# Patient Record
Sex: Female | Born: 1985
Health system: Southern US, Community
[De-identification: ages and names within clinical notes are randomized; demographics above are authoritative.]

## PROBLEM LIST (undated history)

## (undated) ENCOUNTER — Inpatient Hospital Stay (HOSPITAL_COMMUNITY): Payer: Self-pay

## (undated) DIAGNOSIS — R002 Palpitations: Secondary | ICD-10-CM

## (undated) DIAGNOSIS — R51 Headache: Secondary | ICD-10-CM

## (undated) DIAGNOSIS — B999 Unspecified infectious disease: Secondary | ICD-10-CM

## (undated) DIAGNOSIS — I493 Ventricular premature depolarization: Secondary | ICD-10-CM

## (undated) DIAGNOSIS — Z8614 Personal history of Methicillin resistant Staphylococcus aureus infection: Secondary | ICD-10-CM

## (undated) DIAGNOSIS — R519 Headache, unspecified: Secondary | ICD-10-CM

## (undated) DIAGNOSIS — R079 Chest pain, unspecified: Secondary | ICD-10-CM

## (undated) DIAGNOSIS — J45909 Unspecified asthma, uncomplicated: Secondary | ICD-10-CM

## (undated) DIAGNOSIS — I10 Essential (primary) hypertension: Secondary | ICD-10-CM

## (undated) DIAGNOSIS — O1490 Unspecified pre-eclampsia, unspecified trimester: Secondary | ICD-10-CM

## (undated) DIAGNOSIS — F419 Anxiety disorder, unspecified: Secondary | ICD-10-CM

## (undated) DIAGNOSIS — R0600 Dyspnea, unspecified: Secondary | ICD-10-CM

## (undated) HISTORY — DX: Chest pain, unspecified: R07.9

## (undated) HISTORY — DX: Dyspnea, unspecified: R06.00

## (undated) HISTORY — DX: Ventricular premature depolarization: I49.3

## (undated) HISTORY — DX: Anxiety disorder, unspecified: F41.9

## (undated) HISTORY — DX: Palpitations: R00.2

## (undated) HISTORY — DX: Essential (primary) hypertension: I10

## (undated) HISTORY — PX: NO PAST SURGERIES: SHX2092

---
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
## 2005-02-09 ENCOUNTER — Emergency Department (HOSPITAL_COMMUNITY): Admission: EM | Admit: 2005-02-09 | Discharge: 2005-02-09 | Payer: Self-pay | Admitting: Family Medicine

## 2006-03-04 ENCOUNTER — Inpatient Hospital Stay (HOSPITAL_COMMUNITY): Admission: AD | Admit: 2006-03-04 | Discharge: 2006-03-04 | Payer: Self-pay | Admitting: Family Medicine

## 2006-03-20 ENCOUNTER — Emergency Department (HOSPITAL_COMMUNITY): Admission: EM | Admit: 2006-03-20 | Discharge: 2006-03-20 | Payer: Self-pay | Admitting: Emergency Medicine

## 2006-11-23 ENCOUNTER — Emergency Department (HOSPITAL_COMMUNITY): Admission: EM | Admit: 2006-11-23 | Discharge: 2006-11-23 | Payer: Self-pay | Admitting: Emergency Medicine

## 2007-02-08 ENCOUNTER — Emergency Department (HOSPITAL_COMMUNITY): Admission: EM | Admit: 2007-02-08 | Discharge: 2007-02-08 | Payer: Self-pay | Admitting: Emergency Medicine

## 2007-04-25 ENCOUNTER — Emergency Department (HOSPITAL_COMMUNITY): Admission: EM | Admit: 2007-04-25 | Discharge: 2007-04-25 | Payer: Self-pay | Admitting: Emergency Medicine

## 2007-12-12 ENCOUNTER — Inpatient Hospital Stay (HOSPITAL_COMMUNITY): Admission: AD | Admit: 2007-12-12 | Discharge: 2007-12-13 | Payer: Self-pay | Admitting: Obstetrics & Gynecology

## 2008-03-26 ENCOUNTER — Inpatient Hospital Stay (HOSPITAL_COMMUNITY): Admission: AD | Admit: 2008-03-26 | Discharge: 2008-03-26 | Payer: Self-pay | Admitting: Obstetrics & Gynecology

## 2008-04-04 ENCOUNTER — Inpatient Hospital Stay (HOSPITAL_COMMUNITY): Admission: AD | Admit: 2008-04-04 | Discharge: 2008-04-04 | Payer: Self-pay | Admitting: Obstetrics & Gynecology

## 2009-05-23 ENCOUNTER — Emergency Department (HOSPITAL_COMMUNITY): Admission: EM | Admit: 2009-05-23 | Discharge: 2009-05-23 | Payer: Self-pay | Admitting: Family Medicine

## 2009-09-06 ENCOUNTER — Emergency Department (HOSPITAL_COMMUNITY): Admission: EM | Admit: 2009-09-06 | Discharge: 2009-09-06 | Payer: Self-pay | Admitting: Family Medicine

## 2010-04-23 ENCOUNTER — Emergency Department (HOSPITAL_COMMUNITY): Admission: EM | Admit: 2010-04-23 | Discharge: 2010-04-23 | Payer: Self-pay | Admitting: Family Medicine

## 2011-01-25 LAB — POCT RAPID STREP A (OFFICE): Streptococcus, Group A Screen (Direct): NEGATIVE

## 2011-03-27 NOTE — Discharge Summary (Signed)
NAME:  Raven Walters, Raven Walters                      ACCOUNT NO.:  [id number]   MEDICAL RECORD NO.:  [id number]                   PATIENT TYPE:  INP   LOCATION:  9157                                 FACILITY:  [facility]   PHYSICIAN:  Phil D. Okey Dupre, M.D.                  DATE OF BIRTH:  April 22, 1986   DATE OF ADMISSION:  01/06/2003  DATE OF DISCHARGE:  01/10/2003                                 DISCHARGE SUMMARY   DISCHARGE DIAGNOSES:  1. Preterm contractions.  2. Bacterial vaginosis.  3. Trichomonas.  4. Chlamydia.  5. External cervical dilatation.   DISCHARGE MEDICATIONS:  1. Prenatal vitamins.  2. Flagyl 500 mg p.o. b.i.d. x7 total doses.  3. Terbutaline 2.5 mg p.o. q.4-6h. p.r.n. contractions.   FOLLOWUP STUDIES:  GBS done January 07, 2003 is pending.   CONSULTATIONS:  None.   HISTORY OF PRESENT ILLNESS:  Please see admission history and physical for  further details.  Briefly, the patient is a very pleasant 25 year old G1, P0  female who is admitted at 27 weeks and 1 day with complaints of uterine  contractions.  She was admitted to the antenatal unit for further  monitoring.   Upon admission appropriate laboratory studies were obtained with the results  as noted in laboratory data section.  She was placed on monitor uterine  contractions as well as fetal heart rate was monitored closely.  The patient  had intermittent contractions throughout her stay which improved.  She  received one dose of terbutaline at the time of admission which slowed down  her contractions and she received one on the day prior to discharge which  further reduced her contractions.  Serial examinations were obtained and  there was no change in her cervical dilatation.  The patient was initially  empirically treated with Mefoxin and Flagyl.  This was continued throughout  her stay.  Her Chlamydia was positive and she was therefore treated with  azithromycin.  Gonorrhea was treated with Mefoxin which has  adequate gram-  negative coverage.  The patient was placed on IV fluids as well during her  monitoring period.  The patient tolerated her stay well.  She had no  significant problems, though vital signs were monitored regularly without  complications.  At the time of discharge the patient was ready to go home.  Given that she was preterm and still in high school, social work was  consulted to help with the patient and they have assisted [place] in filling out  forms to allow the patient to be home bound and still go to school.  These  forms have been filled out by myself in the chart.   LABORATORY DATA:  CBC on January 10, 2003:  White cell count 12.9, hemoglobin  11.3, hematocrit 32.5, platelet count 158,000.  Urinalysis shows moderate  leukocyte esterase, many bacteria.  Urine culture shows no growth.  Group B  Strep pending.  GCC positive for Chlamydia, negative for gonorrhea.  RPR  nonreactive.  Wet prep shows Trichomonas and BV, no evidence of yeast.   Radiologic data:  An ultrasound was obtained on January 07, 2003 which  shows grade 1 anterior placenta with no previa.  Fetal heart rate was 146  and AFI was within normal limits.  The baby was breech at this time and no  significant abnormalities were noted.  Estimated fetal weight for a 28-week  baby was somewhere between 1100-1200 g.   DISCHARGE INSTRUCTIONS:  1. Follow up with Conni Elliot, M.D. at [facility] January 12, 2003     at 11 a.m.  This has already been scheduled.  2. Medications as directed.  3. The patient is to be home bound on modified bed rest, being able to get     up and move around the house and take a shower, but no baths, and eat     meals.  If she does well this may be expanded, but will hold on any     further activity at this time given her high risk of going back into     preterm labor.  4. The patient is to refrain from any intercourse until delivery.  5. We have talked to the patient three times  about giving her significant     other treated for Chlamydia as the patient was treated earlier in     pregnancy and likely had a reinfection.  6. Return to [facility] or [facility] for return of contractions not     responsive to terbutaline, decreased fetal activity, bleeding, leakage of     fluid, high fevers, chills, or any other concerns.  7. If the patient does have contractions at home and everything else is     stable, I have encouraged her to take one terbutaline to see if these     contractions slow down and to drink fluids and rest.  If contractions do     not slow down she is instructed to return to [facility] or [facility]     immediately.     Lew Dawes, M.D.                 Phil D. Okey Dupre, M.D.    AD/MEDQ  D:  01/10/2003  T:  01/10/2003  Job:  [id number]

## 2011-03-27 NOTE — Discharge Summary (Signed)
NAME:  Raven Walters, Raven Walters                      ACCOUNT NO.:  [id number]   MEDICAL RECORD NO.:  [id number]                   PATIENT TYPE:  INP   LOCATION:  9129                                 [facility]ILITY:  [facility]   PHYSICIAN:  Barney Drain, M.D.                 DATE OF BIRTH:  [birth year]   DATE OF ADMISSION:  03/28/2003  DATE OF DISCHARGE:  04/01/2003                                 DISCHARGE SUMMARY   DISCHARGE DIAGNOSES:  1. Intrauterine pregnancy at 39-2/7 weeks delivered by normal spontaneous     vaginal delivery.  2. Mild pregnancy-induced hypertension.   DISCHARGE MEDICATIONS:  1. Ibuprofen 600 mg one tablet p.o. q.6h. p.r.n. for pain.  2. Ortho Evra, apply first patch in two week, change patches weekly x 2, and     apply no patch the fourth week.  3. Continue prenatal vitamins one tablet p.o. q.24h. x 6 weeks.   DISPOSITION:  The patient was discharged home in stable.  Instructed to  having nothing in vagina for six weeks.  Follow up at [facility] in six  weeks.   HISTORY OF PRESENT ILLNESS:  Ms. Raven Walters is an 25 year old G1 who  presented at 39-2/7 weeks dated by ultrasound, complaining of pelvic  pressure, questionable loss of fluid, mild contractions, and good fetal  activity.  She denied spotty vision.  No headache.  Her blood pressures have  been slightly elevated.  She had completed PIH labs and a 24-hour urine  collection three days prior to presentation.  She had been receiving care at  [facility] since her first trimester.   PHYSICAL EXAMINATION:  VITAL SIGNS:  Pertinent findings on physical exam  include a blood pressure of 141/90.  GENERAL APPEARANCE:  The patient was comfortable and awake.  EXTREMITIES:  No edema.  NEUROLOGIC:  Intact.  PELVIC:  The digital cervical exam showed the cervix to be 2-3 cm dilated,  60% effaced, and high station.  The cervix was very posterior.  ABDOMEN:  Fetal heart tracing showed a baseline rate of 145 beats per  minute, good variability, reactive 20 x 20 accelerations without  decelerations.  Toco monitoring showed contractions every three to five  minutes lasting about 30 seconds.   PERTINENT LABORATORY FINDINGS:  White count 13.6, hemoglobin 13.2, platelets  137.  BUN 4, creatinine 0.7.  AST 17, ALT less than 19.  LDH 199.  Uric acid  5.2.  Urine showed 30 mg/dl of protein.   HOSPITAL COURSE:  #1 - INTRAUTERINE PREGNANCY AT 39-2/7 WEEKS:  The patient  underwent induction of labor for definitive management of pregnancy-induced  high blood pressure.  The patient went on to deliver under normal  spontaneous vaginal delivery on Mar 29, 2003.  A second degree laceration  was repaired with 3-0 Vicryl.  The patient's postoperative course was  complicated by pregnancy-induced high blood pressure.   #2 - PREGNANCY-INDUCED HYPERTENSION:  Magnesium sulfate  was begun at the  time of admission and continued postpartum until the patient's blood  pressures and PIH labs had normalized and the patient was achieving adequate  diuresis.   #3 - FEMALE INFANT:  Apgars of 8 at one minute and 9 at five minutes.  Status  post circumcision.  Bottle feeding.   #4 - FAMILY PLANNING:  The patient is to use Ortho Evra patch.   #5 - POSTPARTUM HEMOGLOBIN 10.3 ON Mar 30, 2003.                                               Barney Drain, M.D.    SS/MEDQ  D:  04/01/2003  T:  04/01/2003  Job:  [id number]

## 2011-07-31 LAB — WET PREP, GENITAL
Trich, Wet Prep: NONE SEEN
Yeast Wet Prep HPF POC: NONE SEEN

## 2011-07-31 LAB — GC/CHLAMYDIA PROBE AMP, GENITAL
Chlamydia, DNA Probe: NEGATIVE
GC Probe Amp, Genital: NEGATIVE

## 2011-08-05 LAB — URINALYSIS, ROUTINE W REFLEX MICROSCOPIC
Glucose, UA: NEGATIVE
Glucose, UA: NEGATIVE
Ketones, ur: 15 — AB
Ketones, ur: 15 — AB
Protein, ur: 30 — AB
Protein, ur: NEGATIVE
pH: 6

## 2011-08-05 LAB — URINE CULTURE
Colony Count: >=100000
Colony Count: >=100000

## 2011-08-05 LAB — URINE MICROSCOPIC-ADD ON

## 2011-08-05 LAB — POCT PREGNANCY, URINE
Operator id: 251141
Preg Test, Ur: POSITIVE

## 2014-12-17 ENCOUNTER — Other Ambulatory Visit: Payer: Self-pay | Admitting: Obstetrics & Gynecology

## 2014-12-17 ENCOUNTER — Other Ambulatory Visit (HOSPITAL_COMMUNITY)
Admission: RE | Admit: 2014-12-17 | Discharge: 2014-12-17 | Disposition: A | Discharge status: Home / Self Care | Payer: BLUE CROSS/BLUE SHIELD | Source: Ambulatory Visit | Attending: Obstetrics & Gynecology | Admitting: Obstetrics & Gynecology

## 2014-12-17 DIAGNOSIS — Z113 Encounter for screening for infections with a predominantly sexual mode of transmission: Secondary | ICD-10-CM | POA: Insufficient documentation

## 2014-12-17 DIAGNOSIS — Z01419 Encounter for gynecological examination (general) (routine) without abnormal findings: Secondary | ICD-10-CM | POA: Insufficient documentation

## 2014-12-20 LAB — CYTOLOGY - PAP

## 2015-01-07 ENCOUNTER — Encounter (HOSPITAL_COMMUNITY): Payer: Self-pay | Admitting: Emergency Medicine

## 2015-01-07 ENCOUNTER — Emergency Department (HOSPITAL_COMMUNITY)
Admission: EM | Admit: 2015-01-07 | Discharge: 2015-01-07 | Disposition: A | Discharge status: Home / Self Care | Payer: BLUE CROSS/BLUE SHIELD | Source: Home / Self Care | Attending: Emergency Medicine | Admitting: Emergency Medicine

## 2015-01-07 DIAGNOSIS — J309 Allergic rhinitis, unspecified: Secondary | ICD-10-CM

## 2015-01-07 DIAGNOSIS — J019 Acute sinusitis, unspecified: Secondary | ICD-10-CM

## 2015-01-07 HISTORY — DX: Unspecified asthma, uncomplicated: J45.909

## 2015-01-07 MED ORDER — CEFDINIR 300 MG PO CAPS
300.0000 mg | ORAL_CAPSULE | Freq: Two times a day (BID) | ORAL | Status: DC
Start: 2015-01-07 — End: 2015-09-20

## 2015-01-07 MED ORDER — PREDNISONE 20 MG PO TABS: 20.0000 mg | ORAL_TABLET | Freq: Two times a day (BID) | ORAL | Status: DC

## 2015-01-07 NOTE — Discharge Instructions (Signed)
Try a Netti Pot for sinus irrigation.l  People who suffer from allergies frequently have symptoms of nasal congestion, runny nose, sneezing, itching of the nose, eyes, ears or throat, mucous in the throat, watering of the eyes and cough.  These symptoms are caused by the body's immune response to environmental allergens.  For seasonal allergies this is pollen (tree pollen in the spring, grass pollen in the summer, and weed pollen in the fall).  Year round allergy symptoms are usually caused by dust or mould.  Many people have year round symptoms which are worse seasonally.  For people who have seasonal allergies, pollen avoidance may help to decease symptoms.  This means keeping windows in the house down and windows in the car up.  Run your air conditioning, since this filters out many of the pollen particles.  If you have to spend a prolonged time outdoors during heavy pollen season, it might be prudent to wear a mask.  These can be purchased at any drug store.  When you come in after heavy pollen exposure, your skin, clothing and hair are covered with pollen.  Changing your clothing, taking a shower, and washing your hair may help with your pollen exposure.  Also, your bedding, pillow, and pillowcase may become contaminated with pollen, so frequent washing of your bedding and pillowcase and changing out your pillow may help as well.  (Your pillow can also be a source of dust and mould exposure as well.)  Showering at bedtime may also help.  During heavy pollen season (April and September), a large amount of pollen gets trapped in your nasal cavity.  This can contribute to ongoing allergy symptoms.  Saline irrigation of the nasal cavity can help to remove this and relieve allergy symptoms.  This can be accomplished in several ways.  You can mix up your own saline solution using the following recipe:  8 oz of distilled or boiled water, 1/2 tsp of table salt (sodium choride), and a pinch of baking soda (sodium  bicarbonate).  If nasal congestion is a problem.  1 to 2 drops of Afrin solution can be added to this as well.  To do the irrigation, purchase a nasal bulb syringe (the kind you would use to clean out an infant's nose).  Fill this up with the solution, lean you head over a sink with the nostril to be irrigated turned upward, insert the syringe into your nostril, making a tight seal, and gently irrigate, compressing the bulb.  The solution will flow into your nostril and out the other, some may also come out of your mouth.  Repeat this on both sides.  You can do this once daily.  Do not store the solution, mix it up fresh each day.  A commercial solution, called Neomed Solution, can be purchased over the counter without prescription.  You can also use a Netti Pot for irrigation.  These can be purchased at your drug store as well.  Be sure to use distilled or boiled water in these as well and make sure the Netti pot is completely dry between uses.  Over the counter medications can be helpful, and in many cases can completely control allergy symptoms without resorting to more expensive prescription meds.   Antihistamines are the mainstay of allergy treatment.  The newer non-sedating antihistamines are all available over the counter.  These include Allegra, Zyrtec, and Claritin which also can be purchased in their generic forms: fexofenadine, cetirizine, and  Cetirizine.  Combining these  meds with a decongestant such as pseudoephedrine or phenylephrine helps with nasal congestion, but decongestants can also cause elevations in blood pressure.  Pseudoephedrine tends to be more effective than phenylephrine.  The older, more sedating antihistamines such as chlorpheniramine, brompheniramine, and diphenhydramine are also very effective, sometimes more so than the newer antihistamines, but with the price of more sedation.  You should be careful about driving or operating heavy machinery when taking sedating antihistamines,  and men with enlarged prostates may experience urinary retention with diphenhydramine.  Naslacrom nasal spray can be very effective for allergy symptoms.  It is available over the counter and has very few side effects.  The dosage is 2 sprays in each nostril twice daily.  It is recommended that you pinch your nose shut for 30 seconds after using it since it is a watery spray and can run out.  It can be used as long as needed.  There is no risk of dependency.  For people with year round allergies, dust, mould, insect emanations, and pet dander are usually the culprits.  To avoid dust, you need to avoid dust mites which are the main source of allergens in house dust.  Cover your bedding with moisture and mite impervious covers.  These can be purchased at any mattress store.  The modern covers are a little expensive, but not at all uncomfortable. Keeping your house as dry as possible will also help to control dust mites.  Do not use a humidifier and it may help to use a dehumidifier.  Use of a HEPA filter air filter is also a great way to reduce dust and mold exposure.  These units can be purchased commercially.  Make sure to buy one large enough for the room you intend to use it.  Change the filter as per the manufacturer's instructions.  Also, using a HEPA filter vacuum for your carpets is helpful.  There are chemicals that you can sprinkle on your carpet called acaricides that will kill dist mites.  The most commonly used brand is Acarosan.  This can be purchased on line.  It does have to be periodically reapplied.  Wash you pillows and bedsheets regularly in hot water.         Sinusitis Sinusitis is redness, soreness, and inflammation of the paranasal sinuses. Paranasal sinuses are air pockets within the bones of your face (beneath the eyes, the middle of the forehead, or above the eyes). In healthy paranasal sinuses, mucus is able to drain out, and air is able to circulate through them by way of  your nose. However, when your paranasal sinuses are inflamed, mucus and air can become trapped. This can allow bacteria and other germs to grow and cause infection. Sinusitis can develop quickly and last only a short time (acute) or continue over a long period (chronic). Sinusitis that lasts for more than 12 weeks is considered chronic.  CAUSES  Causes of sinusitis include:  Allergies.  Structural abnormalities, such as displacement of the cartilage that separates your nostrils (deviated septum), which can decrease the air flow through your nose and sinuses and affect sinus drainage.  Functional abnormalities, such as when the small hairs (cilia) that line your sinuses and help remove mucus do not work properly or are not present. SIGNS AND SYMPTOMS  Symptoms of acute and chronic sinusitis are the same. The primary symptoms are pain and pressure around the affected sinuses. Other symptoms include:  Upper toothache.  Earache.  Headache.  Bad breath.  Decreased sense of smell and taste.  A cough, which worsens when you are lying flat.  Fatigue.  Fever.  Thick drainage from your nose, which often is green and may contain pus (purulent).  Swelling and warmth over the affected sinuses. DIAGNOSIS  Your health care provider will perform a physical exam. During the exam, your health care provider may:  Look in your nose for signs of abnormal growths in your nostrils (nasal polyps).  Tap over the affected sinus to check for signs of infection.  View the inside of your sinuses (endoscopy) using an imaging device that has a light attached (endoscope). If your health care provider suspects that you have chronic sinusitis, one or more of the following tests may be recommended:  Allergy tests.  Nasal culture. A sample of mucus is taken from your nose, sent to a lab, and screened for bacteria.  Nasal cytology. A sample of mucus is taken from your nose and examined by your health care  provider to determine if your sinusitis is related to an allergy. TREATMENT  Most cases of acute sinusitis are related to a viral infection and will resolve on their own within 10 days. Sometimes medicines are prescribed to help relieve symptoms (pain medicine, decongestants, nasal steroid sprays, or saline sprays).  However, for sinusitis related to a bacterial infection, your health care provider will prescribe antibiotic medicines. These are medicines that will help kill the bacteria causing the infection.  Rarely, sinusitis is caused by a fungal infection. In theses cases, your health care provider will prescribe antifungal medicine. For some cases of chronic sinusitis, surgery is needed. Generally, these are cases in which sinusitis recurs more than 3 times per year, despite other treatments. HOME CARE INSTRUCTIONS   Drink plenty of water. Water helps thin the mucus so your sinuses can drain more easily.  Use a humidifier.  Inhale steam 3 to 4 times a day (for example, sit in the bathroom with the shower running).  Apply a warm, moist washcloth to your face 3 to 4 times a day, or as directed by your health care provider.  Use saline nasal sprays to help moisten and clean your sinuses.  Take medicines only as directed by your health care provider.  If you were prescribed either an antibiotic or antifungal medicine, finish it all even if you start to feel better. SEEK IMMEDIATE MEDICAL CARE IF:  You have increasing pain or severe headaches.  You have nausea, vomiting, or drowsiness.  You have swelling around your face.  You have vision problems.  You have a stiff neck.  You have difficulty breathing. MAKE SURE YOU:   Understand these instructions.  Will watch your condition.  Will get help right away if you are not doing well or get worse. Document Released: 10/26/2005 Document Revised: 03/12/2014 Document Reviewed: 11/10/2011 [facility] Patient Information 2015 [place],  [place]. This information is not intended to replace advice given to you by your health care provider. Make sure you discuss any questions you have with your health care provider.

## 2015-01-07 NOTE — ED Notes (Signed)
Pt states that she has had sinus pain for a week with congestion, nasal drip, and a slight cough. Pt denies any nausea, vomiting, diarrhea.

## 2015-01-07 NOTE — ED Provider Notes (Signed)
   Chief Complaint   Sinus Problem and Nasal Congestion   History of Present Illness   Raven Walters is a 29 year old female who has had a one-week history of nasal congestion with bloody, yellow-green drainage, ear congestion, headache, sinus pressure and pain, hoarseness, sore throat, subjective fever, and chills. She denies any cough. She has a history of allergies and takes Claritin and Flonase.  Review of Systems   Other than as noted above, the patient denies any of the following symptoms: Systemic:  No fevers, chills, sweats, or myalgias. Eye:  No redness or discharge. ENT:  No ear pain, headache, nasal congestion, drainage, sinus pressure, or sore throat. Neck:  No neck pain, stiffness, or swollen glands. Lungs:  No cough, sputum production, hemoptysis, wheezing, chest tightness, shortness of breath or chest pain. GI:  No abdominal pain, nausea, vomiting or diarrhea.  PMFSH   Past medical history, family history, social history, meds, and allergies were reviewed.   Physical exam   Vital signs:  BP 142/96 mmHg  Pulse 87  Temp(Src) 98.9 F (37.2 C) (Oral)  Resp 18  SpO2 100%  LMP 12/14/2014 General:  Alert and oriented.  In no distress.  Skin warm and dry. Eye:  No conjunctival injection or drainage. Lids were normal. ENT:  TMs and canals were normal, without erythema or inflammation.  Nasal mucosa was clear and uncongested, without drainage.  Mucous membranes were moist.  Pharynx was clear with no exudate or drainage.  There were no oral ulcerations or lesions. Neck:  Supple, no adenopathy, tenderness or mass. Lungs:  No respiratory distress.  Lungs were clear to auscultation, without wheezes, rales or rhonchi.  Breath sounds were clear and equal bilaterally.  Heart:  Regular rhythm, without gallops, murmers or rubs. Skin:  Clear, warm, and dry, without rash or lesions.  Assessment     The primary encounter diagnosis was Acute sinusitis, recurrence not  specified, unspecified location. A diagnosis of Allergic rhinitis, unspecified allergic rhinitis type was also pertinent to this visit.  Plan    1.  Meds:  The following meds were prescribed:   Discharge Medication List as of 01/07/2015  8:39 PM    START taking these medications   Details  cefdinir (OMNICEF) 300 MG capsule Take 1 capsule (300 mg total) by mouth 2 (two) times daily., Starting 01/07/2015, Until Discontinued, Normal    predniSONE (DELTASONE) 20 MG tablet Take 1 tablet (20 mg total) by mouth 2 (two) times daily., Starting 01/07/2015, Until Discontinued, Normal        2.  Patient Education/Counseling:  The patient was given appropriate handouts, self care instructions, and instructed in symptomatic relief.  Instructed to get extra fluids and extra rest.    3.  Follow up:  The patient was told to follow up here if no better in 3 to 4 days, or sooner if becoming worse in any way, and given some red flag symptoms such as increasing fever, difficulty breathing, chest pain, or persistent vomiting which would prompt immediate return.       Reuben Likesavid C Eion Timbrook, MD 01/07/15 2132

## 2015-09-20 ENCOUNTER — Inpatient Hospital Stay (HOSPITAL_COMMUNITY)
Admission: AD | Admit: 2015-09-20 | Discharge: 2015-09-20 | Disposition: A | Discharge status: Home / Self Care | Payer: BLUE CROSS/BLUE SHIELD | Source: Ambulatory Visit | Attending: Obstetrics & Gynecology | Admitting: Obstetrics & Gynecology

## 2015-09-20 ENCOUNTER — Encounter (HOSPITAL_COMMUNITY): Payer: Self-pay | Admitting: *Deleted

## 2015-09-20 DIAGNOSIS — R112 Nausea with vomiting, unspecified: Secondary | ICD-10-CM | POA: Diagnosis not present

## 2015-09-20 DIAGNOSIS — Z3A28 28 weeks gestation of pregnancy: Secondary | ICD-10-CM | POA: Insufficient documentation

## 2015-09-20 DIAGNOSIS — K529 Noninfective gastroenteritis and colitis, unspecified: Secondary | ICD-10-CM | POA: Diagnosis not present

## 2015-09-20 DIAGNOSIS — O99612 Diseases of the digestive system complicating pregnancy, second trimester: Secondary | ICD-10-CM

## 2015-09-20 DIAGNOSIS — O26893 Other specified pregnancy related conditions, third trimester: Secondary | ICD-10-CM | POA: Diagnosis present

## 2015-09-20 DIAGNOSIS — O98813 Other maternal infectious and parasitic diseases complicating pregnancy, third trimester: Secondary | ICD-10-CM | POA: Diagnosis not present

## 2015-09-20 HISTORY — DX: Personal history of Methicillin resistant Staphylococcus aureus infection: Z86.14

## 2015-09-20 HISTORY — DX: Unspecified pre-eclampsia, unspecified trimester: O14.90

## 2015-09-20 LAB — CBC
HCT: 38.7 % (ref 36.0–46.0)
Hemoglobin: 13 g/dL (ref 12.0–15.0)
MCH: 30.4 pg (ref 26.0–34.0)
MCHC: 33.6 g/dL (ref 30.0–36.0)
MCV: 90.4 fL (ref 78.0–100.0)
PLATELETS: 168 10*3/uL (ref 150–400)
RBC: 4.28 MIL/uL (ref 3.87–5.11)
RDW: 14.3 % (ref 11.5–15.5)
WBC: 15.3 10*3/uL — ABNORMAL HIGH (ref 4.0–10.5)

## 2015-09-20 LAB — PROTEIN / CREATININE RATIO, URINE
CREATININE, URINE: 238 mg/dL
Protein Creatinine Ratio: 0.11 mg/mg{Cre} (ref 0.00–0.15)
TOTAL PROTEIN, URINE: 25 mg/dL

## 2015-09-20 LAB — URINE MICROSCOPIC-ADD ON

## 2015-09-20 LAB — COMPREHENSIVE METABOLIC PANEL
ALK PHOS: 51 U/L (ref 38–126)
ALT: 18 U/L (ref 14–54)
AST: 17 U/L (ref 15–41)
Albumin: 3.2 g/dL — ABNORMAL LOW (ref 3.5–5.0)
Anion gap: 9 (ref 5–15)
BUN: 7 mg/dL (ref 6–20)
CALCIUM: 9 mg/dL (ref 8.9–10.3)
CHLORIDE: 103 mmol/L (ref 101–111)
CO2: 21 mmol/L — ABNORMAL LOW (ref 22–32)
CREATININE: 0.54 mg/dL (ref 0.44–1.00)
Glucose, Bld: 86 mg/dL (ref 65–99)
Potassium: 3.7 mmol/L (ref 3.5–5.1)
Sodium: 133 mmol/L — ABNORMAL LOW (ref 135–145)
Total Bilirubin: 0.8 mg/dL (ref 0.3–1.2)
Total Protein: 7.7 g/dL (ref 6.5–8.1)

## 2015-09-20 LAB — URINALYSIS, ROUTINE W REFLEX MICROSCOPIC
Glucose, UA: NEGATIVE mg/dL
NITRITE: NEGATIVE
PH: 6 (ref 5.0–8.0)
PROTEIN: NEGATIVE mg/dL
Urobilinogen, UA: 2 mg/dL — ABNORMAL HIGH (ref 0.0–1.0)

## 2015-09-20 MED ORDER — LACTATED RINGERS IV BOLUS (SEPSIS)
1000.0000 mL | Freq: Once | INTRAVENOUS | Status: AC
  Administered 2015-09-20: 1000 mL via INTRAVENOUS

## 2015-09-20 MED ORDER — PROMETHAZINE HCL 12.5 MG PO TABS: 12.5000 mg | ORAL_TABLET | Freq: Four times a day (QID) | ORAL | Status: DC | PRN

## 2015-09-20 MED ORDER — METOCLOPRAMIDE HCL 5 MG/ML IJ SOLN
10.0000 mg | Freq: Once | INTRAMUSCULAR | Status: AC
  Administered 2015-09-20: 10 mg via INTRAVENOUS
  Filled 2015-09-20: qty 2

## 2015-09-20 NOTE — MAU Note (Signed)
States has had some vomiting and diarrhea today. Denies fever. No one around her has been sick.

## 2015-09-20 NOTE — MAU Provider Note (Signed)
History   [id number]   Chief Complaint  Patient presents with  . Emesis    HPI Raven Walters is a 29 y.o. female  G3P1011 at [redacted]w[redacted]d IUP here with report of nausea and vomiting that started this morning.  Reports vomiting approximately 2 times in past 24 hours.  Denies recent contact will ill individuals.  Also reports loose stools x 2.  Denies fever, body aches, or chills.  No report of UTI symptoms.  +headache last night.  No  headache, vision changes, or epigastric pain tonight.    Patient's last menstrual period was 12/14/2014.  OB History  Gravida Para Term Preterm AB SAB TAB Ectopic Multiple Living  # Outcome Date GA Lbr Len/2nd Weight Sex Delivery Anes PTL Lv  3 Current           2 SAB           1 Term               Past Medical History  Diagnosis Date  . Asthma     pt states it is induced by excercise  . Preeclampsia   . Hx MRSA infection     History reviewed. No pertinent family history.  Social History   Social History  . Marital Status: Single    Spouse Name: N/A  . Number of Children: N/A  . Years of Education: N/A   Social History Main Topics  . Smoking status: Former Smoker    Types: Cigarettes  . Smokeless tobacco: None  . Alcohol Use: Yes     Comment: social  . Drug Use: No  . Sexual Activity: Not Asked   Other Topics Concern  . None   Social History Narrative    No Known Allergies  No current facility-administered medications on file prior to encounter.   Current Outpatient Prescriptions on File Prior to Encounter  Medication Sig Dispense Refill  . cefdinir (OMNICEF) 300 MG capsule Take 1 capsule (300 mg total) by mouth 2 (two) times daily. (Patient not taking: Reported on 09/20/2015) 20 capsule 0  . predniSONE (DELTASONE) 20 MG tablet Take 1 tablet (20 mg total) by mouth 2 (two) times daily. (Patient not taking: Reported on 09/20/2015) 10 tablet 0     Review of Systems  Constitutional: Negative for fever and  chills.  Gastrointestinal: Positive for nausea, vomiting and diarrhea.  Genitourinary: Positive for pelvic pain (pelvic pressure). Negative for dysuria and hematuria.  Neurological: Negative for dizziness and weakness.  All other systems reviewed and are negative.    Physical Exam   Filed Vitals:   09/20/15 1709 09/20/15 1714  BP:  143/82  Pulse:  110  Temp:  98.3 F (36.8 C)  TempSrc:  Oral  Resp:  18  Height:  (1.727 m)   Weight: 99.338 kg (219 lb)     Physical Exam  Constitutional: She is oriented to person, place, and time. She appears well-developed and well-nourished.  HENT:  Head: Normocephalic.  Mouth/Throat: Mucous membranes are not dry.  Neck: Normal range of motion. Neck supple.  Cardiovascular: Normal rate, regular rhythm and normal heart sounds.   Respiratory: Effort normal and breath sounds normal. No respiratory distress.  GI: Soft. Bowel sounds are normal. There is no tenderness.  Genitourinary: No bleeding in the vagina. Vaginal discharge (mucusy) found.  Musculoskeletal: Normal range of motion. She exhibits no edema.  Neurological: She is alert and  oriented to person, place, and time.  Skin: Skin is warm and dry.   Dilation: Closed Effacement (%): Thick Cervical Position: Posterior Exam by:: Margarita Mail, CNM  FHR 130's Toco none MAU Course  Procedures  MDM 1824 Consulted with Dr. Richardson Dopp > Reviewed HPI/Exam/labs > give IV fluids, PO challenge, obtain preX labs  Results for orders placed or performed during the hospital encounter of 09/20/15 (from the past 24 hour(s))  Urinalysis, Routine w reflex microscopic (not at [facility])     Status: Abnormal   Collection Time: 09/20/15  5:08 PM  Result Value Ref Range   Color, Urine YELLOW YELLOW   APPearance HAZY (A) CLEAR   Specific Gravity, Urine >1.030 (H) 1.005 - 1.030   pH 6.0 5.0 - 8.0   Glucose, UA NEGATIVE NEGATIVE mg/dL   Hgb urine dipstick TRACE (A) NEGATIVE   Bilirubin Urine SMALL (A) NEGATIVE    Ketones, ur >80 (A) NEGATIVE mg/dL   Protein, ur NEGATIVE NEGATIVE mg/dL   Urobilinogen, UA 2.0 (H) 0.0 - 1.0 mg/dL   Nitrite NEGATIVE NEGATIVE   Leukocytes, UA SMALL (A) NEGATIVE  Urine microscopic-add on     Status: Abnormal   Collection Time: 09/20/15  5:08 PM  Result Value Ref Range   Squamous Epithelial / LPF FEW (A) RARE   WBC, UA 0-2 <3 WBC/hpf   RBC / HPF 0-2 <3 RBC/hpf   Urine-Other MUCOUS PRESENT   Protein / creatinine ratio, urine     Status: None   Collection Time: 09/20/15  5:08 PM  Result Value Ref Range   Creatinine, Urine 238.00 mg/dL   Total Protein, Urine 25 mg/dL   Protein Creatinine Ratio 0.11 0.00 - 0.15 mg/mg[Cre]  CBC     Status: Abnormal   Collection Time: 09/20/15  6:39 PM  Result Value Ref Range   WBC 15.3 (H) 4.0 - 10.5 K/uL   RBC 4.28 3.87 - 5.11 MIL/uL   Hemoglobin 13.0 12.0 - 15.0 g/dL   HCT 01.0 27.2 - 53.6 %   MCV 90.4 78.0 - 100.0 fL   MCH 30.4 26.0 - 34.0 pg   MCHC 33.6 30.0 - 36.0 g/dL   RDW 64.4 03.4 - 74.2 %   Platelets 168 150 - 400 K/uL  Comprehensive metabolic panel     Status: Abnormal   Collection Time: 09/20/15  6:39 PM  Result Value Ref Range   Sodium 133 (L) 135 - 145 mmol/L   Potassium 3.7 3.5 - 5.1 mmol/L   Chloride 103 101 - 111 mmol/L   CO2 21 (L) 22 - 32 mmol/L   Glucose, Bld 86 65 - 99 mg/dL   BUN 7 6 - 20 mg/dL   Creatinine, Ser 5.95 0.44 - 1.00 mg/dL   Calcium 9.0 8.9 - 63.8 mg/dL   Total Protein 7.7 6.5 - 8.1 g/dL   Albumin 3.2 (L) 3.5 - 5.0 g/dL   AST 17 15 - 41 U/L   ALT 18 14 - 54 U/L   Alkaline Phosphatase 51 38 - 126 U/L   Total Bilirubin 0.8 0.3 - 1.2 mg/dL   GFR calc non Af Amer >60 >60 mL/min   GFR calc Af Amer >60 >60 mL/min   Anion gap 9 5 - 15   2015 Pt reports feeling "much better" after receiving IV fluids and meds.  Able to tolerate ice chips.    Assessment and Plan  29 y.o. G3P1011 at [redacted]w[redacted]d IUP  Gastroenteritis  Plan: Discharge to home RX Phenergan for nausea  Increase fluids Advance  diet as tolerated Follow-up if no improvement or worsening of symptoms  Marlis EdelsonWalidah N Karim, CNM 09/20/2015 8:28 PM

## 2015-09-20 NOTE — Progress Notes (Signed)
Vomited approx 250 cc yellowish fluid just prior to reglan.

## 2015-09-23 LAB — URINE CULTURE

## 2015-10-16 ENCOUNTER — Inpatient Hospital Stay (HOSPITAL_COMMUNITY)
Admission: AD | Admit: 2015-10-16 | Discharge: 2015-10-16 | Disposition: A | Discharge status: Home / Self Care | Payer: BC Managed Care – PPO | Source: Ambulatory Visit | Attending: Obstetrics and Gynecology | Admitting: Obstetrics and Gynecology

## 2015-10-16 ENCOUNTER — Other Ambulatory Visit: Payer: Self-pay | Admitting: Obstetrics and Gynecology

## 2015-10-16 ENCOUNTER — Inpatient Hospital Stay (HOSPITAL_COMMUNITY): Payer: BC Managed Care – PPO

## 2015-10-16 ENCOUNTER — Encounter (HOSPITAL_COMMUNITY): Payer: Self-pay | Admitting: *Deleted

## 2015-10-16 DIAGNOSIS — O283 Abnormal ultrasonic finding on antenatal screening of mother: Secondary | ICD-10-CM | POA: Insufficient documentation

## 2015-10-16 DIAGNOSIS — O289 Unspecified abnormal findings on antenatal screening of mother: Secondary | ICD-10-CM | POA: Diagnosis not present

## 2015-10-16 DIAGNOSIS — Z3A32 32 weeks gestation of pregnancy: Secondary | ICD-10-CM | POA: Insufficient documentation

## 2015-10-16 DIAGNOSIS — O10013 Pre-existing essential hypertension complicating pregnancy, third trimester: Secondary | ICD-10-CM | POA: Diagnosis not present

## 2015-10-16 DIAGNOSIS — Z87891 Personal history of nicotine dependence: Secondary | ICD-10-CM | POA: Diagnosis not present

## 2015-10-16 DIAGNOSIS — O288 Other abnormal findings on antenatal screening of mother: Secondary | ICD-10-CM

## 2015-10-16 NOTE — Discharge Instructions (Signed)
Preterm Labor Information °Preterm labor is when labor starts at less than 37 weeks of pregnancy. The normal length of a pregnancy is 39 to 41 weeks. °CAUSES °Often, there is no identifiable underlying cause as to why a woman goes into preterm labor. One of the most common known causes of preterm labor is infection. Infections of the uterus, cervix, vagina, amniotic sac, bladder, kidney, or even the lungs (pneumonia) can cause labor to start. Other suspected causes of preterm labor include:  °· Urogenital infections, such as yeast infections and bacterial vaginosis.   °· Uterine abnormalities (uterine shape, uterine septum, fibroids, or bleeding from the placenta).   °· A cervix that has been operated on (it may fail to stay closed).   °· Malformations in the fetus.   °· Multiple gestations (twins, triplets, and so on).   °· Breakage of the amniotic sac.   °RISK FACTORS °· Having a previous history of preterm labor.   °· Having premature rupture of membranes (PROM).   °· Having a placenta that covers the opening of the cervix (placenta previa).   °· Having a placenta that separates from the uterus (placental abruption).   °· Having a cervix that is too weak to hold the fetus in the uterus (incompetent cervix).   °· Having too much fluid in the amniotic sac (polyhydramnios).   °· Taking illegal drugs or smoking while pregnant.   °· Not gaining enough weight while pregnant.   °· Being younger than 18 and older than 30 years old.   °· Having a low socioeconomic status.   °· Being African American. °SYMPTOMS °Signs and symptoms of preterm labor include:  °· Menstrual-like cramps, abdominal pain, or back pain. °· Uterine contractions that are regular, as frequent as six in an hour, regardless of their intensity (may be mild or painful). °· Contractions that start on the top of the uterus and spread down to the lower abdomen and back.   °· A sense of increased pelvic pressure.   °· A watery or bloody mucus discharge that  comes from the vagina.   °TREATMENT °Depending on the length of the pregnancy and other circumstances, your health care provider may suggest bed rest. If necessary, there are medicines that can be given to stop contractions and to mature the fetal lungs. If labor happens before 34 weeks of pregnancy, a prolonged hospital stay may be recommended. Treatment depends on the condition of both you and the fetus.  °WHAT SHOULD YOU DO IF YOU THINK YOU ARE IN PRETERM LABOR? °Call your health care provider right away. You will need to go to the hospital to get checked immediately. °HOW CAN YOU PREVENT PRETERM LABOR IN FUTURE PREGNANCIES? °You should:  °· Stop smoking if you smoke.  °· Maintain healthy weight gain and avoid chemicals and drugs that are not necessary. °· Be watchful for any type of infection. °· Inform your health care provider if you have a known history of preterm labor. °  °This information is not intended to replace advice given to you by your health care provider. Make sure you discuss any questions you have with your health care provider. °  °Document Released: 01/16/2004 Document Revised: 06/28/2013 Document Reviewed: 11/28/2012 °Elsevier Interactive Patient Education ©2016 [employer] °Fetal Movement Counts °Patient Name: __________________________________________________ Patient Due Date: ____________________ °Performing a fetal movement count is highly recommended in high-risk pregnancies, but it is good for every pregnant woman to do. Your health care provider may ask you to start counting fetal movements at 28 weeks of the pregnancy. Fetal movements often increase: °· After eating a full meal. °·   After physical activity. °· After eating or drinking something sweet or cold. °· At rest. °Pay attention to when you feel the baby is most active. This will help you notice a pattern of your baby's sleep and wake cycles and what factors contribute to an increase in fetal movement. It is important to  perform a fetal movement count at the same time each day when your baby is normally most active.  °HOW TO COUNT FETAL MOVEMENTS °· Find a quiet and comfortable area to sit or lie down on your left side. Lying on your left side provides the best blood and oxygen circulation to your baby. °· Write down the day and time on a sheet of paper or in a journal. °· Start counting kicks, flutters, swishes, rolls, or jabs in a 2-hour period. You should feel at least 10 movements within 2 hours. °· If you do not feel 10 movements in 2 hours, wait 2-3 hours and count again. Look for a change in the pattern or not enough counts in 2 hours. °SEEK MEDICAL CARE IF: °· You feel less than 10 counts in 2 hours, tried twice. °· There is no movement in over an hour. °· The pattern is changing or taking longer each day to reach 10 counts in 2 hours. °· You feel the baby is not moving as he or she usually does. °Date: ____________ Movements: ____________ Start time: ____________ Finish time: ____________  °Date: ____________ Movements: ____________ Start time: ____________ Finish time: ____________ °Date: ____________ Movements: ____________ Start time: ____________ Finish time: ____________ °Date: ____________ Movements: ____________ Start time: ____________ Finish time: ____________ °Date: ____________ Movements: ____________ Start time: ____________ Finish time: ____________ °Date: ____________ Movements: ____________ Start time: ____________ Finish time: ____________ °Date: ____________ Movements: ____________ Start time: ____________ Finish time: ____________ °Date: ____________ Movements: ____________ Start time: ____________ Finish time: ____________  °Date: ____________ Movements: ____________ Start time: ____________ Finish time: ____________ °Date: ____________ Movements: ____________ Start time: ____________ Finish time: ____________ °Date: ____________ Movements: ____________ Start time: ____________ Finish time:  ____________ °Date: ____________ Movements: ____________ Start time: ____________ Finish time: ____________ °Date: ____________ Movements: ____________ Start time: ____________ Finish time: ____________ °Date: ____________ Movements: ____________ Start time: ____________ Finish time: ____________ °Date: ____________ Movements: ____________ Start time: ____________ Finish time: ____________  °Date: ____________ Movements: ____________ Start time: ____________ Finish time: ____________ °Date: ____________ Movements: ____________ Start time: ____________ Finish time: ____________ °Date: ____________ Movements: ____________ Start time: ____________ Finish time: ____________ °Date: ____________ Movements: ____________ Start time: ____________ Finish time: ____________ °Date: ____________ Movements: ____________ Start time: ____________ Finish time: ____________ °Date: ____________ Movements: ____________ Start time: ____________ Finish time: ____________ °Date: ____________ Movements: ____________ Start time: ____________ Finish time: ____________  °Date: ____________ Movements: ____________ Start time: ____________ Finish time: ____________ °Date: ____________ Movements: ____________ Start time: ____________ Finish time: ____________ °Date: ____________ Movements: ____________ Start time: ____________ Finish time: ____________ °Date: ____________ Movements: ____________ Start time: ____________ Finish time: ____________ °Date: ____________ Movements: ____________ Start time: ____________ Finish time: ____________ °Date: ____________ Movements: ____________ Start time: ____________ Finish time: ____________ °Date: ____________ Movements: ____________ Start time: ____________ Finish time: ____________  °Date: ____________ Movements: ____________ Start time: ____________ Finish time: ____________ °Date: ____________ Movements: ____________ Start time: ____________ Finish time: ____________ °Date: ____________ Movements:  ____________ Start time: ____________ Finish time: ____________ °Date: ____________ Movements: ____________ Start time: ____________ Finish time: ____________ °Date: ____________ Movements: ____________ Start time: ____________ Finish time: ____________ °Date: ____________ Movements: ____________ Start time: ____________ Finish time: ____________ °Date: ____________ Movements: ____________ Start time: ____________   Finish time: ____________  °Date: ____________ Movements: ____________ Start time: ____________ Finish time: ____________ °Date: ____________ Movements: ____________ Start time: ____________ Finish time: ____________ °Date: ____________ Movements: ____________ Start time: ____________ Finish time: ____________ °Date: ____________ Movements: ____________ Start time: ____________ Finish time: ____________ °Date: ____________ Movements: ____________ Start time: ____________ Finish time: ____________ °Date: ____________ Movements: ____________ Start time: ____________ Finish time: ____________ °Date: ____________ Movements: ____________ Start time: ____________ Finish time: ____________  °Date: ____________ Movements: ____________ Start time: ____________ Finish time: ____________ °Date: ____________ Movements: ____________ Start time: ____________ Finish time: ____________ °Date: ____________ Movements: ____________ Start time: ____________ Finish time: ____________ °Date: ____________ Movements: ____________ Start time: ____________ Finish time: ____________ °Date: ____________ Movements: ____________ Start time: ____________ Finish time: ____________ °Date: ____________ Movements: ____________ Start time: ____________ Finish time: ____________ °Date: ____________ Movements: ____________ Start time: ____________ Finish time: ____________  °Date: ____________ Movements: ____________ Start time: ____________ Finish time: ____________ °Date: ____________ Movements: ____________ Start time: ____________ Finish  time: ____________ °Date: ____________ Movements: ____________ Start time: ____________ Finish time: ____________ °Date: ____________ Movements: ____________ Start time: ____________ Finish time: ____________ °Date: ____________ Movements: ____________ Start time: ____________ Finish time: ____________ °Date: ____________ Movements: ____________ Start time: ____________ Finish time: ____________ °  °This information is not intended to replace advice given to you by your health care provider. Make sure you discuss any questions you have with your health care provider. °  °Document Released: 11/25/2006 Document Revised: 11/16/2014 Document Reviewed: 08/22/2012 °Elsevier Interactive Patient Education ©2016 [employer]vier Inc[employer] ° °

## 2015-10-16 NOTE — MAU Provider Note (Signed)
  History     CSN: [id number]  Arrival date and time: 10/16/15 1721   First Provider Initiated Contact with Patient 10/16/15 1743         Chief Complaint  Patient presents with  . nonreactive NST in office    HPI Raven Walters is a 29 y.o. G3P1011 at 574w0d who presents sent from office for non reactive NST Patient in office for antenatal testing d/t hypertension. Send to MAU for further evaluation d/t non reactive tracing.  Patient denies abdominal pain, vaginal bleeding, or LOF.  Reports positive fetal movement.   OB History    Gravida Para Term Preterm AB TAB SAB Ectopic Multiple Living   3 1 1  1  1   1       Past Medical History  Diagnosis Date  . Asthma     pt states it is induced by excercise  . Preeclampsia   . Hx MRSA infection     Past Surgical History  Procedure Laterality Date  . No past surgeries      History reviewed. No pertinent family history.  Social History  Substance Use Topics  . Smoking status: Former Smoker    Types: Cigarettes  . Smokeless tobacco: None  . Alcohol Use: Yes     Comment: social    Allergies: No Known Allergies  Prescriptions prior to admission  Medication Sig Dispense Refill Last Dose  . LABETALOL HCL PO Take 50 mg by mouth 2 (two) times daily.   09/19/2015 at Unknown time  . Prenatal Vit-Fe Fumarate-FA (PRENATAL MULTIVITAMIN) TABS tablet Take 1 tablet by mouth daily at 12 noon.   09/19/2015 at Unknown time  . promethazine (PHENERGAN) 12.5 MG tablet Take 1 tablet (12.5 mg total) by mouth every 6 (six) hours as needed for nausea. 10 tablet 0     Review of Systems  Gastrointestinal: Negative.   Genitourinary: Negative.    Physical Exam   Blood pressure 122/80, pulse 85, temperature 98.9 F (37.2 C), temperature source Oral, resp. rate 20, last menstrual period 12/14/2014, SpO2 99 %.  Physical Exam  Nursing note and vitals reviewed. Constitutional: She is oriented to person, place, and time. She appears  well-developed and well-nourished. No distress.  HENT:  Head: Normocephalic and atraumatic.  Eyes: Conjunctivae are normal. Right eye exhibits no discharge. Left eye exhibits no discharge. No scleral icterus.  Neck: Normal range of motion.  Cardiovascular: Normal rate.   Respiratory: Effort normal. No respiratory distress.  Neurological: She is alert and oriented to person, place, and time.  Skin: Skin is warm and dry. She is not diaphoretic.  Psychiatric: She has a normal mood and affect. Her behavior is normal. Judgment and thought content normal.   Fetal Tracing:  Baseline: 125 Variability: moderate Accelerations: 15x15 Decelerations: none  Toco: x1   MAU Course  Procedures  MDM Category 1 tracing BPP 8/8 with AFI of 14.73 1842- S/w Dr. Richardson Walters. Informed of FHT & BPP results. Ok to discharge home.   Assessment and Plan  A:  1. Non-reactive NST (non-stress test)    P: Discharge home Keep schedule f/u in office Preterm labor precautions & fetal kick count form  Raven HornErin Willian Donson, NP  10/16/2015, 5:43 PM

## 2015-10-16 NOTE — MAU Note (Signed)
nonreactive in office, sent for further eval, BPP

## 2015-11-10 NOTE — L&D Delivery Note (Signed)
Delivery Note At 6:51 PM a viable female was delivered via Vaginal, Spontaneous Delivery (Presentation: Right Occiput Anterior).  APGAR: 9, 9; weight 6 lb 6.2 oz (2897 g).   Placenta status: Intact, Spontaneous.  Cord: 3 vessels with the following complications: None.  Due to uterine atony following delivery, pt received 1000mcg cytotec per rectum. Fundus following placement and massage was noted to be firm.  Anesthesia: Epidural  Episiotomy: None Lacerations: 1st degree;Perineal Suture Repair: 2.0 Est. Blood Loss (mL): 350  Mom to postpartum.  Baby to Couplet care / Skin to Skin.  Myna HidalgoZAN, [name] Radziewicz, M 11/23/2015, 9:58 PM

## 2015-11-13 ENCOUNTER — Encounter (HOSPITAL_COMMUNITY): Payer: Self-pay | Admitting: *Deleted

## 2015-11-13 ENCOUNTER — Inpatient Hospital Stay (HOSPITAL_COMMUNITY)
Admission: AD | Admit: 2015-11-13 | Discharge: 2015-11-13 | Disposition: A | Discharge status: Home / Self Care | Payer: BC Managed Care – PPO | Source: Ambulatory Visit | Attending: Obstetrics and Gynecology | Admitting: Obstetrics and Gynecology

## 2015-11-13 DIAGNOSIS — Z3493 Encounter for supervision of normal pregnancy, unspecified, third trimester: Secondary | ICD-10-CM | POA: Diagnosis not present

## 2015-11-13 NOTE — Discharge Instructions (Signed)
Keep your appointment tomorrow with Dr. Charlotta Newtonzan. Call her or provider on call with further concerns.

## 2015-11-13 NOTE — MAU Note (Signed)
Urine in lab 

## 2015-11-13 NOTE — MAU Note (Signed)
Been having contractions and low back pain- started yesterday.  Are now 6-427min apart.  Increase in pelvic pressure.  Had NST and BPP yesterday.  Hx of pre-eclampsia

## 2015-11-20 ENCOUNTER — Telehealth (HOSPITAL_COMMUNITY): Payer: Self-pay | Admitting: *Deleted

## 2015-11-20 ENCOUNTER — Encounter (HOSPITAL_COMMUNITY): Payer: Self-pay | Admitting: *Deleted

## 2015-11-20 NOTE — Telephone Encounter (Signed)
Preadmission screen  

## 2015-11-21 ENCOUNTER — Other Ambulatory Visit: Payer: Self-pay | Admitting: Obstetrics & Gynecology

## 2015-11-21 LAB — OB RESULTS CONSOLE GC/CHLAMYDIA
Chlamydia: NEGATIVE
GC PROBE AMP, GENITAL: NEGATIVE

## 2015-11-21 LAB — OB RESULTS CONSOLE RPR: RPR: NONREACTIVE

## 2015-11-21 LAB — OB RESULTS CONSOLE ABO/RH: RH Type: POSITIVE

## 2015-11-21 LAB — OB RESULTS CONSOLE ANTIBODY SCREEN: ANTIBODY SCREEN: NEGATIVE

## 2015-11-21 LAB — OB RESULTS CONSOLE HIV ANTIBODY (ROUTINE TESTING): HIV: NONREACTIVE

## 2015-11-21 LAB — OB RESULTS CONSOLE RUBELLA ANTIBODY, IGM: RUBELLA: IMMUNE

## 2015-11-21 LAB — OB RESULTS CONSOLE HEPATITIS B SURFACE ANTIGEN: Hepatitis B Surface Ag: NEGATIVE

## 2015-11-21 NOTE — H&P (Signed)
HPI: 30 y/o G2P1001 @ 1678w3d estimated gestational age as dated by 12wk ultrasound who presents for scheduled IOL due to chronic HTN.   no Leaking of Fluid,   no Vaginal Bleeding,   no Uterine Contractions,  + Fetal Movement.  ROS: no HA, no epigastric pain, no visual changes.    Pregnancy complicated by: 1) chronic HTN with h/o preeclampsia in prior pregnancy -Labetalol 100mg  bid -remains asymptomatic.  S/p ASA until 36wks, baseline labs wnl -Followed by antepartum surveillance- NST/BPP twice weekly and serial growth Last US @ 34wk: vertex/anterior/59%  2) Obesity: BMI: 36   Prenatal Transfer Tool  Maternal Diabetes: No Genetic Screening: Normal Maternal Ultrasounds/Referrals: Normal Fetal Ultrasounds or other Referrals:  None Maternal Substance Abuse:  No Significant Maternal Medications:  Meds include: Other: Labetalol 100mg  bid Significant Maternal Lab Results: Lab values include: Group B Strep negative   PNL:  GBS neg, Rub Immune, Hep B neg, RPR NR, HIV neg, GC/C neg, glucola:normal (80) Hgb: 11.9 Blood type: A positive, antibody neg  Immunizations: Tdap: 10/21/15 Flu shot given through work  OBHx: FTNSVD x1, 7#7oz, female complicated by preeclampsia, delivered @ 39wk PMHx:  Chronic HTN, h/o anxiety disorder- no medications currently Meds:  PNV, Labetalol Allergy:  No Known Allergies SurgHx: none SocHx:   no Tobacco, no  EtOH, no Illicit Drugs  O: LMP 12/14/2014 to be obtained upon arrival Examination performed in office on 11/21/15 Gen. AAOx3, NAD CV.  RRR  No murmur.  Resp. CTAB Abd. Gravid,  no tenderness,  no rigidity,  no guarding Extr.  no edema B/L , no calf tenderness, no Homan's B/L FHT: 120 baseline, moderate variability, + accels,  no decels SVE: closed/25/-3, soft, posterior, vertex by exam  Labs: see orders  A/P:  30 y.o. G2P1001 @ 6378w3d EGA who presents for IOL due to chronic HTN -FWB:  Reassuring by doppler -Labor: plan for induction with  cytotec -GBS: neg -Chronic HTN: Pt to continue with Labetalol 100mg  bid.  If BP elevated upon arrival, plan for preeclampsia labs -Pain management: IV or epidural upon request  Myna HidalgoJennifer Faheem Ziemann, DO [phone] (pager) [phone] (office)

## 2015-11-22 LAB — OB RESULTS CONSOLE GBS: STREP GROUP B AG: NEGATIVE

## 2015-11-23 ENCOUNTER — Inpatient Hospital Stay (HOSPITAL_COMMUNITY): Payer: BC Managed Care – PPO | Admitting: Anesthesiology

## 2015-11-23 ENCOUNTER — Inpatient Hospital Stay (HOSPITAL_COMMUNITY)
Admission: RE | Admit: 2015-11-23 | Discharge: 2015-11-23 | Disposition: A | Discharge status: Home / Self Care | Payer: BC Managed Care – PPO | Source: Ambulatory Visit | Attending: Obstetrics & Gynecology | Admitting: Obstetrics & Gynecology

## 2015-11-23 ENCOUNTER — Inpatient Hospital Stay (HOSPITAL_COMMUNITY)
Admit: 2015-11-23 | Discharge: 2015-11-25 | DRG: 775 | Disposition: A | Discharge status: Home / Self Care | Payer: BC Managed Care – PPO | Source: Ambulatory Visit | Attending: Obstetrics & Gynecology | Admitting: Obstetrics & Gynecology

## 2015-11-23 ENCOUNTER — Encounter (HOSPITAL_COMMUNITY): Payer: Self-pay | Admitting: *Deleted

## 2015-11-23 DIAGNOSIS — O164 Unspecified maternal hypertension, complicating childbirth: Principal | ICD-10-CM | POA: Diagnosis present

## 2015-11-23 DIAGNOSIS — O99214 Obesity complicating childbirth: Secondary | ICD-10-CM | POA: Diagnosis present

## 2015-11-23 DIAGNOSIS — Z3490 Encounter for supervision of normal pregnancy, unspecified, unspecified trimester: Secondary | ICD-10-CM

## 2015-11-23 DIAGNOSIS — Z3A37 37 weeks gestation of pregnancy: Secondary | ICD-10-CM

## 2015-11-23 DIAGNOSIS — O9952 Diseases of the respiratory system complicating childbirth: Secondary | ICD-10-CM | POA: Diagnosis present

## 2015-11-23 DIAGNOSIS — J45909 Unspecified asthma, uncomplicated: Secondary | ICD-10-CM | POA: Diagnosis present

## 2015-11-23 DIAGNOSIS — E669 Obesity, unspecified: Secondary | ICD-10-CM | POA: Diagnosis present

## 2015-11-23 DIAGNOSIS — Z6834 Body mass index (BMI) 34.0-34.9, adult: Secondary | ICD-10-CM

## 2015-11-23 LAB — MRSA PCR SCREENING: MRSA BY PCR: NEGATIVE

## 2015-11-23 LAB — CBC
HCT: 37.2 % (ref 36.0–46.0)
HCT: 35.5 % — ABNORMAL LOW (ref 36.0–46.0)
HEMOGLOBIN: 12.8 g/dL (ref 12.0–15.0)
Hemoglobin: 12 g/dL (ref 12.0–15.0)
MCH: 30.4 pg (ref 26.0–34.0)
MCH: 30.5 pg (ref 26.0–34.0)
MCHC: 34.4 g/dL (ref 30.0–36.0)
MCHC: 33.8 g/dL (ref 30.0–36.0)
MCV: 88.4 fL (ref 78.0–100.0)
MCV: 90.3 fL (ref 78.0–100.0)
PLATELETS: 173 10*3/uL (ref 150–400)
Platelets: 183 10*3/uL (ref 150–400)
RBC: 4.21 MIL/uL (ref 3.87–5.11)
RBC: 3.93 MIL/uL (ref 3.87–5.11)
RDW: 14.9 % (ref 11.5–15.5)
RDW: 14.9 % (ref 11.5–15.5)
WBC: 13.8 10*3/uL — AB (ref 4.0–10.5)
WBC: 25.7 10*3/uL — AB (ref 4.0–10.5)

## 2015-11-23 LAB — TYPE AND SCREEN
ABO/RH(D): A POS
ANTIBODY SCREEN: NEGATIVE

## 2015-11-23 LAB — ABO/RH: ABO/RH(D): A POS

## 2015-11-23 MED ORDER — PHENYLEPHRINE 40 MCG/ML (10ML) SYRINGE FOR IV PUSH (FOR BLOOD PRESSURE SUPPORT)
80.0000 ug | PREFILLED_SYRINGE | INTRAVENOUS | Status: DC | PRN
  Filled 2015-11-23: qty 2
  Filled 2015-11-23: qty 20

## 2015-11-23 MED ORDER — CITRIC ACID-SODIUM CITRATE 334-500 MG/5ML PO SOLN: 30.0000 mL | ORAL | Status: DC | PRN

## 2015-11-23 MED ORDER — EPHEDRINE 5 MG/ML INJ
10.0000 mg | INTRAVENOUS | Status: DC | PRN
  Filled 2015-11-23: qty 2

## 2015-11-23 MED ORDER — IBUPROFEN 600 MG PO TABS
600.0000 mg | ORAL_TABLET | Freq: Four times a day (QID) | ORAL | Status: DC
  Administered 2015-11-23 – 2015-11-25 (×6): 600 mg via ORAL
  Filled 2015-11-23 (×6): qty 1

## 2015-11-23 MED ORDER — WITCH HAZEL-GLYCERIN EX PADS: 1.0000 "application " | MEDICATED_PAD | CUTANEOUS | Status: DC | PRN

## 2015-11-23 MED ORDER — LABETALOL HCL 100 MG PO TABS
100.0000 mg | ORAL_TABLET | Freq: Two times a day (BID) | ORAL | Status: DC
  Administered 2015-11-23 (×2): 100 mg via ORAL
  Filled 2015-11-23 (×4): qty 1

## 2015-11-23 MED ORDER — MISOPROSTOL 200 MCG PO TABS
ORAL_TABLET | ORAL | Status: AC
  Filled 2015-11-23: qty 5

## 2015-11-23 MED ORDER — ACETAMINOPHEN 325 MG PO TABS: 650.0000 mg | ORAL_TABLET | ORAL | Status: DC | PRN

## 2015-11-23 MED ORDER — ZOLPIDEM TARTRATE 5 MG PO TABS: 5.0000 mg | ORAL_TABLET | Freq: Every evening | ORAL | Status: DC | PRN

## 2015-11-23 MED ORDER — SIMETHICONE 80 MG PO CHEW: 80.0000 mg | CHEWABLE_TABLET | ORAL | Status: DC | PRN

## 2015-11-23 MED ORDER — BENZOCAINE-MENTHOL 20-0.5 % EX AERO
1.0000 | INHALATION_SPRAY | CUTANEOUS | Status: DC | PRN
Start: 2015-11-23 — End: 2015-11-25
  Administered 2015-11-23: 1 via TOPICAL
  Filled 2015-11-23: qty 56

## 2015-11-23 MED ORDER — LIDOCAINE HCL (PF) 1 % IJ SOLN
INTRAMUSCULAR | Status: DC | PRN
  Administered 2015-11-23 (×2): 4 mL

## 2015-11-23 MED ORDER — MISOPROSTOL 25 MCG QUARTER TABLET
25.0000 ug | ORAL_TABLET | ORAL | Status: DC | PRN
  Administered 2015-11-23: 25 ug via VAGINAL
  Administered 2015-11-23: 1000 ug via VAGINAL
  Administered 2015-11-23: 25 ug via VAGINAL
  Filled 2015-11-23: qty 0.25
  Filled 2015-11-23: qty 1
  Filled 2015-11-23: qty 0.25

## 2015-11-23 MED ORDER — OXYTOCIN BOLUS FROM INFUSION: 500.0000 mL | INTRAVENOUS | Status: DC

## 2015-11-23 MED ORDER — FENTANYL 2.5 MCG/ML BUPIVACAINE 1/10 % EPIDURAL INFUSION (WH - ANES)
14.0000 mL/h | INTRAMUSCULAR | Status: DC | PRN
  Administered 2015-11-23: 14 mL/h via EPIDURAL
  Filled 2015-11-23: qty 125

## 2015-11-23 MED ORDER — LACTATED RINGERS IV SOLN
500.0000 mL | INTRAVENOUS | Status: DC | PRN
  Administered 2015-11-23: 500 mL via INTRAVENOUS

## 2015-11-23 MED ORDER — DIPHENHYDRAMINE HCL 50 MG/ML IJ SOLN: 12.5000 mg | INTRAMUSCULAR | Status: DC | PRN

## 2015-11-23 MED ORDER — LACTATED RINGERS IV SOLN
INTRAVENOUS | Status: DC
  Administered 2015-11-23 (×4): via INTRAVENOUS

## 2015-11-23 MED ORDER — DIBUCAINE 1 % RE OINT: 1.0000 "application " | TOPICAL_OINTMENT | RECTAL | Status: DC | PRN

## 2015-11-23 MED ORDER — OXYCODONE-ACETAMINOPHEN 5-325 MG PO TABS
1.0000 | ORAL_TABLET | ORAL | Status: DC | PRN
Start: 2015-11-23 — End: 2015-11-23

## 2015-11-23 MED ORDER — ONDANSETRON HCL 4 MG PO TABS: 4.0000 mg | ORAL_TABLET | ORAL | Status: DC | PRN

## 2015-11-23 MED ORDER — PRENATAL MULTIVITAMIN CH
1.0000 | ORAL_TABLET | Freq: Every day | ORAL | Status: DC
  Administered 2015-11-24: 1 via ORAL
  Filled 2015-11-23: qty 1

## 2015-11-23 MED ORDER — TERBUTALINE SULFATE 1 MG/ML IJ SOLN
0.2500 mg | Freq: Once | INTRAMUSCULAR | Status: DC | PRN
  Filled 2015-11-23: qty 1

## 2015-11-23 MED ORDER — LACTATED RINGERS IV SOLN
1.0000 m[IU]/min | INTRAVENOUS | Status: DC
  Administered 2015-11-23: 1.333 m[IU]/min via INTRAVENOUS
  Administered 2015-11-23: 4 m[IU]/min via INTRAVENOUS

## 2015-11-23 MED ORDER — ZOLPIDEM TARTRATE 5 MG PO TABS
5.0000 mg | ORAL_TABLET | Freq: Every evening | ORAL | Status: DC | PRN
  Administered 2015-11-23: 5 mg via ORAL
  Filled 2015-11-23: qty 1

## 2015-11-23 MED ORDER — LANOLIN HYDROUS EX OINT: TOPICAL_OINTMENT | CUTANEOUS | Status: DC | PRN

## 2015-11-23 MED ORDER — OXYTOCIN 10 UNIT/ML IJ SOLN
2.5000 [IU]/h | INTRAVENOUS | Status: DC
  Filled 2015-11-23: qty 4

## 2015-11-23 MED ORDER — SENNOSIDES-DOCUSATE SODIUM 8.6-50 MG PO TABS
2.0000 | ORAL_TABLET | ORAL | Status: DC
  Administered 2015-11-24 – 2015-11-25 (×2): 2 via ORAL
  Filled 2015-11-23 (×2): qty 2

## 2015-11-23 MED ORDER — DIPHENHYDRAMINE HCL 25 MG PO CAPS: 25.0000 mg | ORAL_CAPSULE | Freq: Four times a day (QID) | ORAL | Status: DC | PRN

## 2015-11-23 MED ORDER — ONDANSETRON HCL 4 MG/2ML IJ SOLN
4.0000 mg | INTRAMUSCULAR | Status: DC | PRN
Start: 2015-11-23 — End: 2015-11-25

## 2015-11-23 MED ORDER — OXYCODONE-ACETAMINOPHEN 5-325 MG PO TABS: 2.0000 | ORAL_TABLET | ORAL | Status: DC | PRN

## 2015-11-23 MED ORDER — LIDOCAINE HCL (PF) 1 % IJ SOLN
30.0000 mL | INTRAMUSCULAR | Status: DC | PRN
  Filled 2015-11-23: qty 30

## 2015-11-23 NOTE — Progress Notes (Signed)
OB PN:  S: Pt resting comfortably, feeling mild contractions  O: BP 131/85 mmHg  Pulse 74  Temp(Src) 97.8 F (36.6 C) (Oral)  Resp 18  Ht 5\' 8"  (1.727 m)  Wt 103.874 kg (229 lb)  BMI 34.83 kg/m2  LMP 12/14/2014  FHT: 120bpm, moderate variablity, + accels, no decels Toco: irregular SVE: deferred, Cytotec #2 around 0530  A/P: 30 y.o. G2P1001 @ 7729w3d admitted for IOL due to chronic HTN 1. FWB: Cat. I 2. Labor: s/p 2nd dose of cytotec, plan for shower & am breakfast then Pit per protocol Pain: epidural upon request GBS: neg Chronic HTN: BP stable, no evidence of preeclampsia.  Will continue with Labetalol 100mg  bid  Myna HidalgoJennifer Soley Harriss, DO [phone] (pager) [phone] (office)

## 2015-11-23 NOTE — Progress Notes (Signed)
OB PN:  S: Pt resting comfortably s/p shower and breakfast  O: BP 135/83 mmHg  Pulse 82  Temp(Src) 99 F (37.2 C) (Oral)  Resp 18  Ht 5\' 8"  (1.727 m)  Wt 103.874 kg (229 lb)  BMI 34.83 kg/m2  LMP 12/14/2014  FHT: 120bpm, moderate variablity, + accels, no decels Toco: irregular SVE: 1/25/-3  A/P: 30 y.o. G2P1001 @ 5922w3d admitted for IOL due to chronic HTN 1. FWB: Cat. I 2. Labor: plan for Pit per protocol Pain: epidural upon request GBS: neg Chronic HTN: BP stable, no evidence of preeclampsia.  Will continue with Labetalol 100mg  bid  Raven HidalgoJennifer Emrick Hensch, DO [phone] (pager) [phone] (office)

## 2015-11-23 NOTE — Progress Notes (Signed)
OB PN:  S: Pt resting comfortably with epidurla  O: BP 133/85 mmHg  Pulse 82  Temp(Src) 98.5 F (36.9 C) (Oral)  Resp 16  Ht 5\' 8"  (1.727 m)  Wt 103.874 kg (229 lb)  BMI 34.83 kg/m2  LMP 12/14/2014  FHT: 120bpm, moderate variablity, + accels, no decels Toco: q3-4 SVE: 4/50/-3, SROM clear with slight blood tinge  A/P: 30 y.o. G2P1001 @ 1382w3d admitted for IOL due to chronic HTN 1. FWB: Cat. I 2. Labor: continue Pit per protocol Pain: continue epidural GBS: neg Chronic HTN: BP stable, no evidence of preeclampsia.  Will continue with Labetalol 100mg  bid  Raven HidalgoJennifer Jiana Lemaire, DO [phone] (pager) [phone] (office)

## 2015-11-23 NOTE — Anesthesia Procedure Notes (Signed)
Epidural Patient location during procedure: OB  Staffing Anesthesiologist: Treasa Bradshaw Performed by: anesthesiologist   Preanesthetic Checklist Completed: patient identified, site marked, surgical consent, pre-op evaluation, timeout performed, IV checked, risks and benefits discussed and monitors and equipment checked  Epidural Patient position: sitting Prep: site prepped and draped and DuraPrep Patient monitoring: continuous pulse ox and blood pressure Approach: midline Location: L3-L4 Injection technique: LOR saline  Needle:  Needle type: Tuohy  Needle gauge: 17 G Needle length: 9 cm and 9 Needle insertion depth: 5 cm cm Catheter type: closed end flexible Catheter size: 19 Gauge Catheter at skin depth: 10 cm Test dose: negative  Assessment Events: blood not aspirated, injection not painful, no injection resistance, negative IV test and no paresthesia  Additional Notes Patient identified. Risks/Benefits/Options discussed with patient including but not limited to bleeding, infection, nerve damage, paralysis, failed block, incomplete pain control, headache, blood pressure changes, nausea, vomiting, reactions to medication both or allergic, itching and postpartum back pain. Confirmed with bedside nurse the patient's most recent platelet count. Confirmed with patient that they are not currently taking any anticoagulation, have any bleeding history or any family history of bleeding disorders. Patient expressed understanding and wished to proceed. All questions were answered. Sterile technique was used throughout the entire procedure. Please see nursing notes for vital signs. Test dose was given through epidural catheter and negative prior to continuing to dose epidural or start infusion. Warning signs of high block given to the patient including shortness of breath, tingling/numbness in hands, complete motor block, or any concerning symptoms with instructions to call for help. Patient was  given instructions on fall risk and not to get out of bed. All questions and concerns addressed with instructions to call with any issues or inadequate analgesia.      

## 2015-11-23 NOTE — Anesthesia Preprocedure Evaluation (Addendum)
Anesthesia Evaluation  Patient identified by MRN, date of birth, ID band Patient awake    Reviewed: Allergy & Precautions, NPO status , Patient's Chart, lab work & pertinent test results  History of Anesthesia Complications Negative for: history of anesthetic complications  Airway Mallampati: II  TM Distance: >3 FB Neck ROM: Full    Dental no notable dental hx. (+) Dental Advisory Given   Pulmonary asthma ,    Pulmonary exam normal breath sounds clear to auscultation       Cardiovascular hypertension (cHTN), Normal cardiovascular exam Rhythm:Regular Rate:Normal     Neuro/Psych negative neurological ROS  negative psych ROS   GI/Hepatic negative GI ROS, Neg liver ROS,   Endo/Other  obesity  Renal/GU negative Renal ROS  negative genitourinary   Musculoskeletal negative musculoskeletal ROS (+)   Abdominal   Peds negative pediatric ROS (+)  Hematology negative hematology ROS (+)   Anesthesia Other Findings   Reproductive/Obstetrics (+) Pregnancy                            Anesthesia Physical Anesthesia Plan  ASA: II  Anesthesia Plan: Epidural   Post-op Pain Management:    Induction:   Airway Management Planned:   Additional Equipment:   Intra-op Plan:   Post-operative Plan:   Informed Consent: I have reviewed the patients History and Physical, chart, labs and discussed the procedure including the risks, benefits and alternatives for the proposed anesthesia with the patient or authorized representative who has indicated his/her understanding and acceptance.   Dental advisory given  Plan Discussed with: CRNA  Anesthesia Plan Comments:         Anesthesia Quick Evaluation

## 2015-11-24 LAB — CBC
HCT: 32.6 % — ABNORMAL LOW (ref 36.0–46.0)
Hemoglobin: 10.8 g/dL — ABNORMAL LOW (ref 12.0–15.0)
MCH: 29.8 pg (ref 26.0–34.0)
MCHC: 33.1 g/dL (ref 30.0–36.0)
MCV: 89.8 fL (ref 78.0–100.0)
PLATELETS: 157 10*3/uL (ref 150–400)
RBC: 3.63 MIL/uL — ABNORMAL LOW (ref 3.87–5.11)
RDW: 14.9 % (ref 11.5–15.5)
WBC: 16 10*3/uL — AB (ref 4.0–10.5)

## 2015-11-24 MED ORDER — IBUPROFEN 600 MG PO TABS: 600.0000 mg | ORAL_TABLET | Freq: Four times a day (QID) | ORAL | Status: DC | PRN

## 2015-11-24 NOTE — Anesthesia Postprocedure Evaluation (Signed)
Anesthesia Post Note  Patient: Raven Walters  Procedure(s) Performed: * No procedures listed *  Patient location during evaluation: Mother Baby Anesthesia Type: Epidural Level of consciousness: awake and alert, oriented and patient cooperative Pain management: pain level controlled Vital Signs Assessment: post-procedure vital signs reviewed and stable Respiratory status: spontaneous breathing Cardiovascular status: stable Postop Assessment: no headache, epidural receding, patient able to bend at knees and no signs of nausea or vomiting Anesthetic complications: no    Last Vitals:  Filed Vitals:   11/24/15 0219 11/24/15 0624  BP: 117/66 99/45  Pulse: 68 76  Temp: 37.3 C 36.7 C  Resp: 20 18    Last Pain:  Filed Vitals:   11/24/15 0735  PainSc: Asleep                 Merrilyn PumaWRINKLE,[name]

## 2015-11-24 NOTE — Progress Notes (Addendum)
Postpartum Note Day # 1  S:  Patient resting comfortable in bed.  Pain controlled.  Tolerating general. + flatus, no BM.  Lochia moderate.  Ambulating without difficulty.  She denies n/v/f/c, SOB, or CP.  Pt plans on breast and bottle feeding  O: Temp:  [98 F (36.7 C)-99.2 F (37.3 C)] 98.1 F (36.7 C) (01/15 0624) Pulse Rate:  [68-196] 76 (01/15 0624) Resp:  [16-20] 18 (01/15 0624) BP: (98-167)/(45-136) 99/45 mmHg (01/15 0624) Gen: A&Ox3, NAD CV: RRR, no MRG Resp: CTAB Abdomen: obese, soft, NT, ND Uterus: firm, non-tender, below umbilicus Ext: No edema, no calf tenderness bilaterally,  Labs:  Recent Labs  11/23/15 2025 11/24/15 0520  HGB 12.0 10.8*    A/P: Pt is a 30 y.o. [id number] s/p NSVD, PPD#1  - Pain well controlled -GU: UOP is adequate -GI: Tolerating general diet -Activity: encouraged sitting up to chair and ambulation as tolerated -Prophylaxis: early ambulation -chronic HTN: BP low this am of 99/45, will hold Labetalol this am and will continue to monitor -Labs: stable as above  DISPO: Pt considering early discharge today pending PEDS and her BP.  Possible discharge this afternoon.  Myna HidalgoJennifer Ava Deguire, DO [phone] (pager) [phone] (office)

## 2015-11-24 NOTE — Discharge Instructions (Signed)

## 2015-11-24 NOTE — Lactation Note (Signed)
This note was copied from the chart of Raven Walters. Lactation Consultation Note  First time breastfeeding mother reports that BF is going "well" and that at 19 hours of life he has already BF twice.  He has formula fed multiple times.  Reviewed supply and demand with her and volume to supplement on the first day of life if necessary.  He had just finished formula feeding at this consult and was asleep.  Encouraged mother to call out for BF observation and LATCH assessment at the next feeding.  Patient Name: Raven Walters ZOXWR'UToday's Date: 11/24/2015     Maternal Data Has patient been taught Hand Expression?: Yes (taught by RN)  Feeding Feeding Type: Formula  LATCH Score/Interventions                      Lactation Tools Discussed/Used     Consult Status      Soyla DryerJoseph, Juliett Eastburn 11/24/2015, 2:20 PM

## 2015-11-25 NOTE — Discharge Summary (Signed)
OB Discharge Summary     Patient Name: Raven Walters DOB: 1986-10-25 MRN: [id number]  Date of admission: 11/23/2015 Delivering MD: Myna Hidalgo   Date of discharge: 11/25/2015  Admitting diagnosis: 37 wks induction Intrauterine pregnancy: [redacted]w[redacted]d     Secondary diagnosis:  Active Problems:   Intrauterine normal pregnancy  Additional problems: CHTN     Discharge diagnosis: Term Pregnancy Delivered                                                                                                Post partum procedures:None  Augmentation: Pitocin  Complications: None  Hospital course:  Induction of Labor With Vaginal Delivery   30 y.o. yo [id number] at [redacted]w[redacted]d was admitted to the hospital 11/23/2015 for induction of labor.  Indication for induction: CHTN.  Patient had an uncomplicated labor course as follows: Membrane Rupture Time/Date: 3:52 PM ,11/23/2015   Intrapartum Procedures: Episiotomy: None [1]                                         Lacerations:  1st degree [2];Perineal [11]  Patient had delivery of a Viable infant.  Information for the patient's newborn:  Raven Walters, Raven Walters [[id number]]  Delivery Method: Vaginal, Spontaneous Delivery (Filed from Delivery Summary)   11/23/2015  Details of delivery can be found in separate delivery note.  Patient had a routine postpartum course. Patient is discharged home 12/19/2015.   Physical exam  Filed Vitals:   11/24/15 0624 11/24/15 1818 11/24/15 2201 11/25/15 0457  BP: 99/45 140/87 138/86 116/67  Pulse: 76 74 80 66  Temp: 98.1 F (36.7 C) 98.4 F (36.9 C)  98.2 F (36.8 C)  TempSrc:  Oral    Resp: 18 18  20   Height:      Weight:      SpO2:  100%     General: alert, cooperative and no distress Lochia: appropriate Uterine Fundus: firm Incision: N/A DVT Evaluation: No evidence of DVT seen on physical exam. Labs: Lab Results  Component Value Date   WBC 16.0* 11/24/2015   HGB 10.8* 11/24/2015   HCT 32.6* 11/24/2015   MCV  89.8 11/24/2015   PLT 157 11/24/2015   CMP Latest Ref Rng 09/20/2015  Glucose 65 - 99 mg/dL 86  BUN 6 - 20 mg/dL 7  Creatinine 8.46 - 9.62 mg/dL 9.52  Sodium 841 - 324 mmol/L 133(L)  Potassium 3.5 - 5.1 mmol/L 3.7  Chloride 101 - 111 mmol/L 103  CO2 22 - 32 mmol/L 21(L)  Calcium 8.9 - 10.3 mg/dL 9.0  Total Protein 6.5 - 8.1 g/dL 7.7  Total Bilirubin 0.3 - 1.2 mg/dL 0.8  Alkaline Phos 38 - 126 U/L 51  AST 15 - 41 U/L 17  ALT 14 - 54 U/L 18    Discharge instruction: per After Visit Summary and "Baby and Me Booklet".  After visit meds:    Medication List    STOP taking these medications        labetalol  100 MG tablet  Commonly known as:  NORMODYNE     promethazine 12.5 MG tablet  Commonly known as:  PHENERGAN      TAKE these medications        ferrous sulfate 325 (65 FE) MG tablet  Take 325 mg by mouth daily with breakfast.     ibuprofen 600 MG tablet  Commonly known as:  ADVIL,MOTRIN  Take 1 tablet (600 mg total) by mouth every 6 (six) hours as needed for moderate pain or cramping.     prenatal multivitamin Tabs tablet  Take 1 tablet by mouth daily at 12 noon.        Diet: low salt diet  Activity: Advance as tolerated. Pelvic rest for 6 weeks.   Outpatient follow up:2 weeks Follow up Appt:No future appointments. Follow up Visit:No Follow-up on file.  Postpartum contraception: Not Discussed  Newborn Data: Live born female  Birth Weight: 6 lb 6.2 oz (2897 g) APGAR: 9, 9  Baby Feeding: Breast Disposition:home with mother   11/25/2015 Geryl RankinsVARNADO, Korrine Sicard, MD

## 2015-11-25 NOTE — Lactation Note (Signed)
This note was copied from the chart of Raven Scarlette Shortslexis Leclere. Lactation Consultation Note; Mom has been giving several bottles of formula. Reports baby last breast fed at 4 am and is latching well. Gave formula since and baby is asleep in mom's arms Reviewed supply and demand. Mom reports breasts are feeling a little fuller this morning. Reviewed engorgement prevention and treatment. States she has a Medela pump for home, No questions at present. To call prn  Patient Name: Raven Walters ZOXWR'UToday's Date: 11/25/2015 Reason for consult: Follow-up assessment   Maternal Data Formula Feeding for Exclusion: Yes Reason for exclusion: Mother's choice to formula and breast feed on admission Does the patient have breastfeeding experience prior to this delivery?: No  Feeding Feeding Type: Bottle Fed - Formula  LATCH Score/Interventions                      Lactation Tools Discussed/Used WIC Program: No   Consult Status Consult Status: Complete    Pamelia HoitWeeks, Ibrahem Volkman D 11/25/2015, 8:20 AM

## 2017-01-13 ENCOUNTER — Ambulatory Visit (INDEPENDENT_AMBULATORY_CARE_PROVIDER_SITE_OTHER): Payer: BC Managed Care – PPO | Admitting: Family Medicine

## 2017-01-13 ENCOUNTER — Encounter: Payer: Self-pay | Admitting: Family Medicine

## 2017-01-13 VITALS — BP 132/88 | HR 92 | Temp 98.4°F | Resp 18 | Ht 67.72 in | Wt 220.4 lb

## 2017-01-13 DIAGNOSIS — E6609 Other obesity due to excess calories: Secondary | ICD-10-CM

## 2017-01-13 DIAGNOSIS — R079 Chest pain, unspecified: Secondary | ICD-10-CM

## 2017-01-13 DIAGNOSIS — Z6833 Body mass index (BMI) 33.0-33.9, adult: Secondary | ICD-10-CM | POA: Diagnosis not present

## 2017-01-13 DIAGNOSIS — R457 State of emotional shock and stress, unspecified: Secondary | ICD-10-CM | POA: Diagnosis not present

## 2017-01-13 DIAGNOSIS — I10 Essential (primary) hypertension: Secondary | ICD-10-CM | POA: Diagnosis not present

## 2017-01-13 MED ORDER — METOPROLOL SUCCINATE ER 50 MG PO TB24: 50.0000 mg | ORAL_TABLET | Freq: Every day | ORAL | 1 refills | Status: DC

## 2017-01-13 NOTE — Patient Instructions (Addendum)
IF you received an x-ray today, you will receive an invoice from [facility]. Please contact [facility] at [phone] with questions or concerns regarding your invoice.   IF you received labwork today, you will receive an invoice from [place]. Please contact [facility] at [phone] with questions or concerns regarding your invoice.   Our billing staff will not be able to assist you with questions regarding bills from these companies.  You will be contacted with the lab results as soon as they are available. The fastest way to get your results is to activate your My Chart account. Instructions are located on the last page of this paperwork. If you have not heard from [place] regarding the results in 2 weeks, please contact this office.      Exercise Stress Electrocardiogram An exercise stress electrocardiogram is a test that is done to evaluate the blood supply to your heart. This test may also be called exercise stress electrocardiography. The test is done while you are walking on a treadmill. The goal of this test is to raise your heart rate. This test is done to find areas of poor blood flow to the heart by determining the extent of coronary artery disease (CAD). CAD is defined as narrowing in one or more heart (coronary) arteries of more than 70%. If you have an abnormal test result, this may mean that you are not getting adequate blood flow to your heart during exercise. Additional testing may be needed to understand why your test was abnormal. Tell a health care provider about:  Any allergies you have.  All medicines you are taking, including vitamins, herbs, eye drops, creams, and over-the-counter medicines.  Any problems you or family members have had with anesthetic medicines.  Any blood disorders you have.  Any surgeries you have had.  Any medical conditions you have.  Possibility of pregnancy, if this applies. What are the risks? Generally, this is a  safe procedure. However, as with any procedure, complications can occur. Possible complications can include:  Pain or pressure in the following areas:  Chest.  Jaw or neck.  Between your shoulder blades.  Radiating down your left arm.  Dizziness or light-headedness.  Shortness of breath.  Increased or irregular heartbeats.  Nausea or vomiting.  Heart attack (rare). What happens before the procedure?  Avoid all forms of caffeine 24 hours before your test or as directed by your health care provider. This includes coffee, tea (even decaffeinated tea), caffeinated sodas, chocolate, cocoa, and certain pain medicines.  Follow your health care provider's instructions regarding eating and drinking before the test.  Take your medicines as directed at regular times with water unless instructed otherwise. Exceptions may include:  If you have diabetes, ask how you are to take your insulin or pills. It is common to adjust insulin dosing the morning of the test.  If you are taking beta-blocker medicines, it is important to talk to your health care provider about these medicines well before the date of your test. Taking beta-blocker medicines may interfere with the test. In some cases, these medicines need to be changed or stopped 24 hours or more before the test.  If you wear a nitroglycerin patch, it may need to be removed prior to the test. Ask your health care provider if the patch should be removed before the test.  If you use an inhaler for any breathing condition, bring it with you to the test.  If you are an outpatient, bring a  snack so you can eat right after the stress phase of the test.  Do not smoke for 4 hours prior to the test or as directed by your health care provider.  Do not apply lotions, powders, creams, or oils on your chest prior to the test.  Wear loose-fitting clothes and comfortable shoes for the test. This test involves walking on a treadmill. What happens  during the procedure?  Multiple patches (electrodes) will be put on your chest. If needed, small areas of your chest may have to be shaved to get better contact with the electrodes. Once the electrodes are attached to your body, multiple wires will be attached to the electrodes and your heart rate will be monitored.  Your heart will be monitored both at rest and while exercising.  You will walk on a treadmill. The treadmill will be started at a slow pace. The treadmill speed and incline will gradually be increased to raise your heart rate. What happens after the procedure?  Your heart rate and blood pressure will be monitored after the test.  You may return to your normal schedule including diet, activities, and medicines, unless your health care provider tells you otherwise. This information is not intended to replace advice given to you by your health care provider. Make sure you discuss any questions you have with your health care provider. Document Released: 10/23/2000 Document Revised: 04/02/2016 Document Reviewed: 07/03/2013 Elsevier Interactive Patient Education  2017 [employer]  Costochondritis Costochondritis is swelling and irritation (inflammation) of the tissue (cartilage) that connects your ribs to your breastbone (sternum). This causes pain in the front of your chest. The pain usually starts gradually and involves more than one rib. What are the causes? The exact cause of this condition is not always known. It results from stress on the cartilage where your ribs attach to your sternum. The cause of this stress could be:  Chest injury (trauma).  Exercise or activity, such as lifting.  Severe coughing. What increases the risk? You may be at higher risk for this condition if you:  Are female.  Are 12?32 years old.  Recently started a new exercise or work activity.  Have low levels of vitamin D.  Have a condition that makes you cough frequently. What are the signs  or symptoms? The main symptom of this condition is chest pain. The pain:  Usually starts gradually and can be sharp or dull.  Gets worse with deep breathing, coughing, or exercise.  Gets better with rest.  May be worse when you press on the sternum-rib connection (tenderness). How is this diagnosed? This condition is diagnosed based on your symptoms, medical history, and a physical exam. Your health care provider will check for tenderness when pressing on your sternum. This is the most important finding. You may also have tests to rule out other causes of chest pain. These may include:  A chest X-ray to check for lung problems.  An electrocardiogram (ECG) to see if you have a heart problem that could be causing the pain.  An imaging scan to rule out a chest or rib fracture. How is this treated? This condition usually goes away on its own over time. Your health care provider may prescribe an NSAID to reduce pain and inflammation. Your health care provider may also suggest that you:  Rest and avoid activities that make pain worse.  Apply heat or cold to the area to reduce pain and inflammation.  Do exercises to stretch your chest muscles. If  these treatments do not help, your health care provider may inject a numbing medicine at the sternum-rib connection to help relieve the pain. Follow these instructions at home:  Avoid activities that make pain worse. This includes any activities that use chest, abdominal, and side muscles.  If directed, put ice on the painful area:  Put ice in a plastic bag.  Place a towel between your skin and the bag.  Leave the ice on for 20 minutes, 2-3 times a day.  If directed, apply heat to the affected area as often as told by your health care provider. Use the heat source that your health care provider recommends, such as a moist heat pack or a heating pad.  Place a towel between your skin and the heat source.  Leave the heat on for 20-30  minutes.  Remove the heat if your skin turns bright red. This is especially important if you are unable to feel pain, heat, or cold. You may have a greater risk of getting burned.  Take over-the-counter and prescription medicines only as told by your health care provider.  Return to your normal activities as told by your health care provider. Ask your health care provider what activities are safe for you.  Keep all follow-up visits as told by your health care provider. This is important. Contact a health care provider if:  You have chills or a fever.  Your pain does not go away or it gets worse.  You have a cough that does not go away (is persistent). Get help right away if:  You have shortness of breath. This information is not intended to replace advice given to you by your health care provider. Make sure you discuss any questions you have with your health care provider. Document Released: 08/05/2005 Document Revised: 05/15/2016 Document Reviewed: 02/19/2016 Elsevier Interactive Patient Education  2017 [employer]

## 2017-01-13 NOTE — Progress Notes (Addendum)
Chief Complaint  Patient presents with  . Hypertension    HPI  Hypertension: Patient here for follow-up of elevated blood pressure.  She was diagnosed with hypertension in Spring 2016. She is exercising and is adherent to low salt diet.  Blood pressure is not well controlled at home. Cardiac symptoms chest pain, chest pressure/discomfort, exertional chest pressure/discomfort and fatigue. Patient denies claudication, dyspnea, fatigue, lower extremity edema, near-syncope, orthopnea, palpitations and paroxysmal nocturnal dyspnea.  Cardiovascular risk factors: none. Use of agents associated with hypertension: none. History of target organ damage: none. 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  She states that she started to exercise and noticed that she was getting shortness of breath, headache, blurry vision.  At home she checked her bp and her readings was typically 150s systolic She does cross fit for exercise and notices her symptoms after completing exercise. She reports that she is "somewhat having chest pains" earlier today she had shortness of breath.  She denies every having chest pain.  She reports that her chest pain wakes her up from her sleep.  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 Asthma    pt states it is induced by excercise  . Hx MRSA infection   . Preeclampsia     Current Outpatient Prescriptions  Medication Sig Dispense Refill  . ibuprofen (ADVIL,MOTRIN) 600 MG tablet Take 1 tablet (600 mg total) by mouth every 6 (six) hours as needed for moderate pain or  cramping. 30 tablet 0  . ferrous sulfate 325 (65 FE) MG tablet Take 325 mg by mouth daily with breakfast.    . Prenatal Vit-Fe Fumarate-FA (PRENATAL MULTIVITAMIN) TABS tablet Take 1 tablet by mouth daily at 12 noon.     No current facility-administered medications for this visit.     Allergies: No Known Allergies  Past Surgical History:  Procedure Laterality Date  . NO PAST SURGERIES      Social History   Social History  . Marital status: Single    Spouse name: N/A  . Number of children: N/A  . Years of education: N/A   Social History Main Topics  . Smoking status: Never Smoker  . Smokeless tobacco: Never Used  . Alcohol use Yes     Comment: social  . Drug use: No  . Sexual activity: Yes    Birth control/ protection: None   Other Topics Concern  . None   Social History Narrative  . None    Review of Systems  Constitutional: Negative for chills and fever.  Respiratory: Negative for cough, sputum production and shortness of breath.   Gastrointestinal: Negative for abdominal pain, diarrhea, nausea and vomiting.  Genitourinary: Negative for dysuria, frequency and urgency.  Musculoskeletal: Negative for back pain, myalgias and neck pain.  Psychiatric/Behavioral: Negative for depression and suicidal ideas. The patient is nervous/anxious.    See HPI  Objective: Vitals:   01/13/17 1455  BP: 132/88  Pulse: 92  Resp: 18  Temp: 98.4 F (36.9 C)  TempSrc: Oral  SpO2: 99%  Weight: 220 lb 6.4 oz (100  kg)  Height: 5' 7.72" (1.72 m)  Body mass index is 33.79 kg/m.   Physical Exam  Constitutional: She is oriented to person, place, and time. She appears well-developed and well-nourished.  HENT:  Head: Normocephalic and atraumatic.  Right Ear: External ear normal.  Left Ear: External ear normal.  Nose: Nose normal.  Mouth/Throat: Oropharynx is clear and moist.  Eyes: Conjunctivae and EOM are normal. Pupils are equal, round, and reactive to light.  Neck: Normal  range of motion. Neck supple. No JVD present. No tracheal deviation present.  Cardiovascular: Normal rate, regular rhythm, S1 normal, S2 normal, normal heart sounds and intact distal pulses.   No murmur heard. Pulmonary/Chest: Effort normal and breath sounds normal. No stridor. No respiratory distress. She has no wheezes.    Abdominal: Soft. Bowel sounds are normal. She exhibits no distension and no mass. There is no tenderness. There is no rebound and no guarding. No hernia.  Musculoskeletal: Normal range of motion. She exhibits no edema.  Neurological: She is alert and oriented to person, place, and time.  Skin: Skin is warm. Capillary refill takes less than 2 seconds. No rash noted.  Psychiatric: She has a normal mood and affect. Her behavior is normal. Judgment and thought content normal.   ECG: sinus rhythm PR-interval 118msec No st elevation No twi  Assessment and Plan Raven Walters was seen today for hypertension.  Diagnoses and all orders for this visit:  Chest pain on exertion- new problem would advised to follow up for stress test to ensure that nothing is missed Otherwise she could be having costochondritis Would recommend starting naproxen for costocondritis -     EKG 12-Lead -     Lipid panel -     TSH -     CBC with Differential/Platelet -     Basic metabolic panel -     Ambulatory referral to Cardiology  Essential hypertension- bp elevated at home, will start beta blocker to help with emotional stress -     EKG 12-Lead -     Basic metabolic panel -     Ambulatory referral to Cardiology  Emotional stress- new problem advised pt to try something for anxiety if her stress test is negative Will use beta blocker for bp control since it helps with anxiety Metoprolol 50mg  daily  Obesity - pt should continue to limit portions but not to exercise until evaluated by Cardiology    Raven Walters A Creta LevinStallings

## 2017-01-13 NOTE — Addendum Note (Signed)
Addended by: Collie SiadSTALLINGS, ZOE A on: 01/13/2017 04:14 PM   Modules accepted: Level of Service

## 2017-01-14 ENCOUNTER — Encounter: Payer: Self-pay | Admitting: Family Medicine

## 2017-01-14 LAB — LIPID PANEL
CHOL/HDL RATIO: 3.4 ratio (ref 0.0–4.4)
Cholesterol, Total: 153 mg/dL (ref 100–199)
HDL: 45 mg/dL (ref >39)
LDL Calculated: 68 mg/dL (ref 0–99)
Triglycerides: 198 mg/dL — ABNORMAL HIGH (ref 0–149)
VLDL Cholesterol Cal: 40 mg/dL (ref 5–40)

## 2017-01-14 LAB — CBC WITH DIFFERENTIAL/PLATELET
BASOS ABS: 0 10*3/uL (ref 0.0–0.2)
Basos: 0 %
EOS (ABSOLUTE): 0.5 10*3/uL — AB (ref 0.0–0.4)
Eos: 5 %
Hematocrit: 38.1 % (ref 34.0–46.6)
Hemoglobin: 13 g/dL (ref 11.1–15.9)
IMMATURE GRANS (ABS): 0 10*3/uL (ref 0.0–0.1)
IMMATURE GRANULOCYTES: 0 %
LYMPHS: 32 %
Lymphocytes Absolute: 3.4 10*3/uL — ABNORMAL HIGH (ref 0.7–3.1)
MCH: 30.1 pg (ref 26.6–33.0)
MCHC: 34.1 g/dL (ref 31.5–35.7)
MCV: 88 fL (ref 79–97)
Monocytes Absolute: 0.7 10*3/uL (ref 0.1–0.9)
Monocytes: 7 %
NEUTROS PCT: 56 %
Neutrophils Absolute: 5.8 10*3/uL (ref 1.4–7.0)
PLATELETS: 251 10*3/uL (ref 150–379)
RBC: 4.32 x10E6/uL (ref 3.77–5.28)
RDW: 14.2 % (ref 12.3–15.4)
WBC: 10.5 10*3/uL (ref 3.4–10.8)

## 2017-01-14 LAB — BASIC METABOLIC PANEL
BUN / CREAT RATIO: 14 (ref 9–23)
BUN: 11 mg/dL (ref 6–20)
CHLORIDE: 102 mmol/L (ref 96–106)
CO2: 25 mmol/L (ref 18–29)
Calcium: 9.3 mg/dL (ref 8.7–10.2)
Creatinine, Ser: 0.76 mg/dL (ref 0.57–1.00)
GFR calc Af Amer: 121 mL/min/{1.73_m2} (ref >59)
GFR calc non Af Amer: 105 mL/min/{1.73_m2} (ref >59)
GLUCOSE: 76 mg/dL (ref 65–99)
POTASSIUM: 4 mmol/L (ref 3.5–5.2)
Sodium: 142 mmol/L (ref 134–144)

## 2017-01-14 LAB — TSH: TSH: 1.09 u[IU]/mL (ref 0.450–4.500)

## 2017-01-28 ENCOUNTER — Telehealth: Payer: Self-pay | Admitting: Family Medicine

## 2017-01-28 NOTE — Telephone Encounter (Signed)
COULDN'T LEAVE MESSAGE VOICE MAIL FULL FOR PT NEEDING TO  RESCHEDULE APPOINTMENT THAT SHE HAD WITH STALLING ON 02-17-17 SHE WILL NOT BE IN OFFICE THAT DAY

## 2017-02-04 ENCOUNTER — Encounter: Payer: Self-pay | Admitting: Cardiovascular Disease

## 2017-02-04 ENCOUNTER — Ambulatory Visit (INDEPENDENT_AMBULATORY_CARE_PROVIDER_SITE_OTHER): Payer: BC Managed Care – PPO | Admitting: Cardiovascular Disease

## 2017-02-04 VITALS — BP 129/80 | HR 76 | Ht 68.0 in | Wt 222.4 lb

## 2017-02-04 DIAGNOSIS — R079 Chest pain, unspecified: Secondary | ICD-10-CM

## 2017-02-04 DIAGNOSIS — R072 Precordial pain: Secondary | ICD-10-CM

## 2017-02-04 DIAGNOSIS — I10 Essential (primary) hypertension: Secondary | ICD-10-CM | POA: Diagnosis not present

## 2017-02-04 DIAGNOSIS — R002 Palpitations: Secondary | ICD-10-CM

## 2017-02-04 NOTE — Progress Notes (Signed)
Cardiology Office Note   Date:  02/07/2017   ID:  Raven Walters, DOB 01/07/1986, MRN [id number]  PCP:  Raven Walters  Cardiologist:   Raven Walters   Chief Complaint  Patient presents with  . New Patient (Initial Visit)  . Shortness of Breath    randomly.  . Dizziness    occasionally.      History of Present Illness: Raven Walters is a 31 y.o. female with asthma and hypertension who presents for an evaluation of chest pain and palpitations.  She reports palpitations that have been occurring intermittently for the last two years.  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  When she is stressed she feels chest tightness and palpitations. It last for several minutes at a time and improves if she lays down. There is associated shortness of breath and lightheadedness. She has not passed out recently but has in the past.  She denies orthostatic dizziness.  Raven Walters was  exercising with CrossFit regularly and notes shortness of breath but denies exertional chest pain.    Raven Walters first developed hypertension and pre-eclampsia when she was pregnant.  She was treated with labetalol and continued it after pregnancy.  However she stopped taking it several months ago.  She saw Raven Walters 01/14/2016 and was started on metoprolol due to hypertension.  She was referred to cardiology for evaluation of her chest pain.  Since then her BP has been in the upper 120s/80s.  She notes mild bilateral ankle edema that gets better with drinking water and limiting salt.  She denies orthopnea or PND.    Past Medical History:  Diagnosis Date  . Asthma    pt states it is induced by excercise  . Chest pain 02/07/2017  . Essential hypertension 02/07/2017  . Hx MRSA infection   . Palpitations 02/07/2017  . Preeclampsia     Past Surgical History:  Procedure Laterality Date  . NO PAST SURGERIES       Current Outpatient Prescriptions  Medication Sig  Dispense Refill  . ibuprofen (ADVIL,MOTRIN) 600 MG tablet Take 1 tablet (600 mg total) by mouth every 6 (six) hours as needed for moderate pain or cramping. 30 tablet 0  . metoprolol succinate (TOPROL-XL) 50 MG 24 hr tablet Take 1 tablet (50 mg total) by mouth daily. Take with or immediately following a meal. 30 tablet 1  . Prenatal Vit-Fe Fumarate-FA (PRENATAL MULTIVITAMIN) TABS tablet Take 1 tablet by mouth daily at 12 noon.     No current facility-administered medications for this visit.     Allergies:   Patient has no known allergies.    Social History:  The patient  reports that she has never smoked. She has never used smokeless tobacco. She reports that she drinks alcohol. She reports that she does not use drugs.   Family History:  The patient's family history includes Cardiomyopathy in her mother; Diabetes in her maternal grandmother; Heart disease in her father, maternal grandfather, and paternal grandmother; Hyperlipidemia in her father; Hypertension in her brother, father, maternal grandfather, mother, and paternal grandmother; Miscarriages / [place] in her son; Stroke in her maternal grandfather and paternal grandmother.    ROS:  Please see the history of present illness.   Otherwise, review of systems are positive for none.   All other systems are reviewed and negative.    PHYSICAL EXAM: VS:  BP 129/80   Pulse 76   Ht  5\' 8"  (1.727 m)   Wt 100.9 kg (222 lb 6.4 oz)   LMP 01/11/2017   BMI 33.82 kg/m  , BMI Body mass index is 33.82 kg/m. GENERAL:  Well appearing HEENT:  Pupils equal round and reactive, fundi not visualized, oral mucosa unremarkable NECK:  No jugular venous distention, waveform within normal limits, carotid upstroke brisk and symmetric, no bruits, no thyromegaly LYMPHATICS:  No cervical adenopathy LUNGS:  Clear to auscultation bilaterally HEART:  RRR.  PMI not displaced or sustained,S1 and S2 within normal limits, no S3, no S4, no clicks, no rubs, no  murmurs ABD:  Flat, positive bowel sounds normal in frequency in pitch, no bruits, no rebound, no guarding, no midline pulsatile mass, no hepatomegaly, no splenomegaly EXT:  2 plus pulses throughout, no edema, no cyanosis no clubbing SKIN:  No rashes no nodules NEURO:  Cranial nerves II through XII grossly intact, motor grossly intact throughout PSYCH:  Cognitively intact, oriented to person place and time    EKG:  EKG is ordered today. The ekg ordered 01/13/17  demonstrates sinus rhythm.  Rate 71 bpm.   Recent Labs: 01/13/2017: BUN 11; Creatinine, Ser 0.76; Platelets 251; Potassium 4.0; Sodium 142; TSH 1.090    Lipid Panel    Component Value Date/Time   CHOL 153 01/13/2017 1604   TRIG 198 (H) 01/13/2017 1604   HDL 45 01/13/2017 1604   CHOLHDL 3.4 01/13/2017 1604   LDLCALC 68 01/13/2017 1604      Wt Readings from Last 3 Encounters:  02/04/17 100.9 kg (222 lb 6.4 oz)  01/13/17 100 kg (220 lb 6.4 oz)  11/23/15 103.9 kg (229 lb)      ASSESSMENT AND PLAN:  # Atypical chest pain: [name]'s chest pain is atypical and seems more related to anxiety than ischemia.  However, we will get an ETT to evaluate.  # Palpitations: We will get a 7 day event monitor to evaluate.  # Hypertension: Blood pressure is controlled today.  Continue metoprolol as it will also help her palpitations.    Current medicines are reviewed at length with the patient today.  The patient does not have concerns regarding medicines.  The following changes have been made:  no change  Labs/ tests ordered today include:   Orders Placed This Encounter  Procedures  . Exercise Tolerance Test  . Cardiac event monitor     Disposition:   FU with Raven Vanwieren C. Duke Salvia, Walters, [facility] in 4-6 weeks.     This note was written with the assistance of speech recognition software.  Please excuse any transcriptional errors.  Signed, Raven Hancock C. Duke Salvia, Walters, [facility]  02/07/2017 9:00 AM    [facility]

## 2017-02-04 NOTE — Patient Instructions (Signed)
Medication Instructions:  Your physician recommends that you continue on your current medications as directed. Please refer to the Current Medication list given to you today.  Labwork: NONE  Testing/Procedures: Your physician has recommended that you wear an event monitor. Event monitors are medical devices that record the heart's electrical activity. Doctors most often us these monitors to diagnose arrhythmias. Arrhythmias are problems with the speed or rhythm of the heartbeat. The monitor is a small, portable device. You can wear one while you do your normal daily activities. This is usually used to diagnose what is causing palpitations/syncope (passing out). 7 DAY EVENT MONITOR   Your physician has requested that you have an exercise tolerance test. For further information please visit [email or web address]. Please also follow instruction sheet, as given.  Follow-Up: Your physician recommends that you schedule a follow-up appointment in: 4-6 WEEKS   If you need a refill on your cardiac medications before your next appointment, please call your pharmacy.

## 2017-02-07 ENCOUNTER — Encounter: Payer: Self-pay | Admitting: Cardiovascular Disease

## 2017-02-07 DIAGNOSIS — R002 Palpitations: Secondary | ICD-10-CM | POA: Insufficient documentation

## 2017-02-07 DIAGNOSIS — R079 Chest pain, unspecified: Secondary | ICD-10-CM | POA: Insufficient documentation

## 2017-02-07 DIAGNOSIS — I10 Essential (primary) hypertension: Secondary | ICD-10-CM

## 2017-02-07 HISTORY — DX: Palpitations: R00.2

## 2017-02-07 HISTORY — DX: Chest pain, unspecified: R07.9

## 2017-02-07 HISTORY — DX: Essential (primary) hypertension: I10

## 2017-02-16 ENCOUNTER — Ambulatory Visit (INDEPENDENT_AMBULATORY_CARE_PROVIDER_SITE_OTHER): Payer: BC Managed Care – PPO

## 2017-02-16 DIAGNOSIS — R079 Chest pain, unspecified: Secondary | ICD-10-CM

## 2017-02-16 DIAGNOSIS — R002 Palpitations: Secondary | ICD-10-CM

## 2017-02-17 ENCOUNTER — Ambulatory Visit: Payer: BC Managed Care – PPO | Admitting: Family Medicine

## 2017-02-18 ENCOUNTER — Telehealth (HOSPITAL_COMMUNITY): Payer: Self-pay

## 2017-02-18 DIAGNOSIS — R002 Palpitations: Secondary | ICD-10-CM | POA: Diagnosis not present

## 2017-02-18 NOTE — Telephone Encounter (Signed)
Encounter complete. 

## 2017-02-19 ENCOUNTER — Telehealth (HOSPITAL_COMMUNITY): Payer: Self-pay

## 2017-02-19 NOTE — Telephone Encounter (Signed)
Encounter complete. 

## 2017-02-23 ENCOUNTER — Inpatient Hospital Stay (HOSPITAL_COMMUNITY): Admission: RE | Admit: 2017-02-23 | Payer: BC Managed Care – PPO | Source: Ambulatory Visit

## 2017-02-26 ENCOUNTER — Telehealth (HOSPITAL_COMMUNITY): Payer: Self-pay

## 2017-02-26 NOTE — Telephone Encounter (Signed)
Encounter complete. 

## 2017-03-03 ENCOUNTER — Ambulatory Visit (HOSPITAL_COMMUNITY)
Admission: RE | Admit: 2017-03-03 | Discharge: 2017-03-03 | Disposition: A | Discharge status: Home / Self Care | Payer: BC Managed Care – PPO | Source: Ambulatory Visit | Attending: Cardiovascular Disease | Admitting: Cardiovascular Disease

## 2017-03-03 DIAGNOSIS — R079 Chest pain, unspecified: Secondary | ICD-10-CM | POA: Diagnosis not present

## 2017-03-03 DIAGNOSIS — R002 Palpitations: Secondary | ICD-10-CM | POA: Diagnosis not present

## 2017-03-03 LAB — EXERCISE TOLERANCE TEST
CHL RATE OF PERCEIVED EXERTION: 19
CSEPED: 6 min
Estimated workload: 8.4 METS
Exercise duration (sec): 56 s
MPHR: 189 {beats}/min
Peak HR: 139 {beats}/min
Percent HR: 73 %
Rest HR: 70 {beats}/min

## 2017-03-05 ENCOUNTER — Telehealth: Payer: Self-pay | Admitting: *Deleted

## 2017-03-05 NOTE — Telephone Encounter (Signed)
-----   Message from Chilton Si, MD sent at 03/04/2017  8:29 AM EDT ----- Test was not diagnostic because she did not reach her target heart rate.  Lexiscan Myoview

## 2017-03-05 NOTE — Telephone Encounter (Signed)
Left message to call back  

## 2017-03-13 ENCOUNTER — Other Ambulatory Visit: Payer: Self-pay | Admitting: Family Medicine

## 2017-03-17 ENCOUNTER — Other Ambulatory Visit: Payer: Self-pay

## 2017-03-17 DIAGNOSIS — R079 Chest pain, unspecified: Secondary | ICD-10-CM

## 2017-03-17 DIAGNOSIS — R0600 Dyspnea, unspecified: Secondary | ICD-10-CM

## 2017-03-18 ENCOUNTER — Encounter: Payer: Self-pay | Admitting: Cardiovascular Disease

## 2017-03-18 ENCOUNTER — Ambulatory Visit (INDEPENDENT_AMBULATORY_CARE_PROVIDER_SITE_OTHER): Payer: BC Managed Care – PPO | Admitting: Cardiovascular Disease

## 2017-03-18 VITALS — BP 128/88 | HR 83 | Ht 68.0 in | Wt 226.8 lb

## 2017-03-18 DIAGNOSIS — R0789 Other chest pain: Secondary | ICD-10-CM | POA: Diagnosis not present

## 2017-03-18 DIAGNOSIS — I10 Essential (primary) hypertension: Secondary | ICD-10-CM | POA: Diagnosis not present

## 2017-03-18 DIAGNOSIS — R002 Palpitations: Secondary | ICD-10-CM

## 2017-03-18 DIAGNOSIS — I493 Ventricular premature depolarization: Secondary | ICD-10-CM | POA: Diagnosis not present

## 2017-03-18 HISTORY — DX: Ventricular premature depolarization: I49.3

## 2017-03-18 MED ORDER — METOPROLOL SUCCINATE ER 100 MG PO TB24: 100.0000 mg | ORAL_TABLET | Freq: Every day | ORAL | 3 refills | Status: DC

## 2017-03-18 NOTE — Patient Instructions (Addendum)
Medication Instructions:  INCREASE YOUR METOPROLOL TO 100 MG DAILY  Labwork: NONE  Testing/Procedures: Your physician has requested that you have a lexiscan myoview. For further information please visit [email or web address]. Please follow instruction sheet, as given.  Follow-Up: Your physician recommends that you schedule a follow-up appointment in: 3 MONTH OV  If you need a refill on your cardiac medications before your next appointment, please call your pharmacy.

## 2017-03-18 NOTE — Progress Notes (Signed)
Cardiology Office Note   Date:  03/18/2017   ID:  Raven Marteslexis N Voisin, DOB 09/10/1986, MRN [id number]  PCP:  Doristine BosworthStallings, Zoe A, MD  Cardiologist:   Chilton Siiffany [place], MD   Chief Complaint  Patient presents with  . Follow-up      History of Present Illness: Raven Walters is a 31 y.o. female with asthma and hypertension who presents for follow up.  She was initially seen 02/2017 for an evaluation of chest pain and palpitations.  She reports palpitations that have been occurring intermittently for the last two years.  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  She had a 7 day Event monitor 02/2017 that revealed PVCs and no significant arrhythmias.  Since then she continues to have palpitations that are worse at night. She was also referred for an ETT due to exertional chest pain. She achieved 8.4 METs on a Bruce protocol. However the study was nondiagnostic because she did not reach target heart rate. She denies any recent chest pain, but she has not been exercising until she gets her results back. She denies lower extremity edema, orthopnea, or PND.    Past Medical History:  Diagnosis Date  . Asthma    pt states it is induced by excercise  . Chest pain 02/07/2017  . Essential hypertension 02/07/2017  . Hx MRSA infection   . Palpitations 02/07/2017  . Preeclampsia   . PVC (premature ventricular contraction) 03/18/2017    Past Surgical History:  Procedure Laterality Date  . NO PAST SURGERIES       Current Outpatient Prescriptions  Medication Sig Dispense Refill  . ibuprofen (ADVIL,MOTRIN) 600 MG tablet Take 1 tablet (600 mg total) by mouth every 6 (six) hours as needed for moderate pain or cramping. 30 tablet 0  . metoprolol succinate (TOPROL-XL) 100 MG 24 hr tablet Take 1 tablet (100 mg total) by mouth daily. Take with or immediately following a meal. 90 tablet 3  . Prenatal Vit-Fe Fumarate-FA (PRENATAL MULTIVITAMIN) TABS tablet Take 1 tablet by mouth daily at 12  noon.     No current facility-administered medications for this visit.     Allergies:   Patient has no known allergies.    Social History:  The patient  reports that she has never smoked. She has never used smokeless tobacco. She reports that she drinks alcohol. She reports that she does not use drugs.   Family History:  The patient's family history includes Cardiomyopathy in her mother; Diabetes in her maternal grandmother; Heart disease in her father, maternal grandfather, and paternal grandmother; Hyperlipidemia in her father; Hypertension in her brother, father, maternal grandfather, mother, and paternal grandmother; Miscarriages / [place] in her son; Stroke in her maternal grandfather and paternal grandmother.    ROS:  Please see the history of present illness.   Otherwise, review of systems are positive for anxiety.   All other systems are reviewed and negative.    PHYSICAL EXAM: VS:  BP 128/88   Pulse 83   Ht 5\' 8"  (1.727 m)   Wt 102.9 kg (226 lb 12.8 oz)   BMI 34.48 kg/m  , BMI Body mass index is 34.48 kg/m. GENERAL:  Well appearing.  No acute distress. HEENT:  Pupils equal round and reactive, fundi not visualized, oral mucosa unremarkable NECK:  No jugular venous distention, waveform within normal limits, carotid upstroke brisk and symmetric, no bruits LUNGS:  Clear to auscultation bilaterally. No crackles, rhonchi, or wheezes.  HEART:  RRR.  PMI not displaced or sustained,S1 and S2 within normal limits, no S3, no S4, no clicks, no rubs, no murmurs ABD:  Flat, positive bowel sounds normal in frequency in pitch, no bruits, no rebound, no guarding, no midline pulsatile mass, no hepatomegaly, no splenomegaly EXT:  2 plus pulses throughout, no edema, no cyanosis no clubbing SKIN:  No rashes no nodules NEURO:  Cranial nerves II through XII grossly intact, motor grossly intact throughout PSYCH:  Cognitively intact, oriented to person place and time   EKG:  EKG is not  ordered today. The ekg ordered 01/13/17  demonstrates sinus rhythm.  Rate 71 bpm.   ETT 03/03/17:  Blood pressure demonstrated a normal response to exercise.  There was no ST segment deviation noted during stress.   No ST segment deviation, but study was submaximal.   5 day Event monitor: Occasional PVCs  Recent Labs: 01/13/2017: BUN 11; Creatinine, Ser 0.76; Platelets 251; Potassium 4.0; Sodium 142; TSH 1.090    Lipid Panel    Component Value Date/Time   CHOL 153 01/13/2017 1604   TRIG 198 (H) 01/13/2017 1604   HDL 45 01/13/2017 1604   CHOLHDL 3.4 01/13/2017 1604   LDLCALC 68 01/13/2017 1604      Wt Readings from Last 3 Encounters:  03/18/17 102.9 kg (226 lb 12.8 oz)  02/04/17 100.9 kg (222 lb 6.4 oz)  01/13/17 100 kg (220 lb 6.4 oz)      ASSESSMENT AND PLAN:  # Atypical chest pain: ETT Did not reveal any ischemia but was not diagnostic because she did not reach target heart rate. We will obtain a Lexiscan Myoview to better assess.   # PVCs: PVCs were noted on her event monitor. She also had episodes of palpitations that occurred in the setting of sinus rhythm. I suspect that a lot of this is anxiety related. We will increase her metoprolol to 100 mg daily.   # Hypertension: Diastolic blood pressure is mildly elevated we will increase metoprolol as above.    Current medicines are reviewed at length with the patient today.  The patient does not have concerns regarding medicines.  The following changes have been made:  Increase metoprolol to 100 mg daily   Labs/ tests ordered today include:   No orders of the defined types were placed in this encounter.    Disposition:   FU with Lezli Danek C. Duke Salvia, MD, [facility] in 3 months   This note was written with the assistance of speech recognition software.  Please excuse any transcriptional errors.  Signed, Laurieanne Galloway C. Duke Salvia, MD, [facility]  03/18/2017 4:12 PM    [facility]

## 2017-03-18 NOTE — Telephone Encounter (Signed)
OV 03/18/17

## 2017-03-24 ENCOUNTER — Telehealth (HOSPITAL_COMMUNITY): Payer: Self-pay | Admitting: Cardiovascular Disease

## 2017-04-02 NOTE — Telephone Encounter (Signed)
03/24/2017 11:20 AM Phone (Outgoing) Cristino MartesMOORE, Raven N (Self) [phone] (H)   No Answer/Busy - Called pt and was unable to leave a message due to her VM being full.     By Trina AoGriffin, Hannelore Bova A    03/25/2017 02:24 PM Phone ([address]Outgoing) Christell ConstantMOORE, Raven Draftlexis N (Self) [phone] (H)   No Answer/Busy - Called pt and was not able to leave a message due to her VM being full.     By Trina AoGriffin, Horald Birky A    03/30/2017 03:41 PM Phone ([address]Outgoing) Christell ConstantMOORE, Raven Draftlexis N (Self) [phone] (H)   Left Message - Called pt and lmsg for her to CB to get echo scheduled.     By Elita BooneGriffin, Eleni Frank A

## 2017-04-06 ENCOUNTER — Telehealth: Payer: Self-pay | Admitting: *Deleted

## 2017-04-06 NOTE — Telephone Encounter (Signed)
Patient did not get her Lexiscan or office visit scheduled when she was seen on 03/18/17 Left message to call back to get scheduled

## 2017-04-14 NOTE — Telephone Encounter (Signed)
Scheduling has reached out to patient in addition to me leaving message to call back to schedule testing and follow up appointment Patient has not returned calls

## 2017-04-14 NOTE — Telephone Encounter (Signed)
Raven Walters, Raven Walters   2:48 PM  Note           03/24/2017 11:20 AM Phone (Outgoing) Raven Walters, Raven Walters (Self) [phone] (H)    No Answer/Busy - Called pt and was unable to leave Walters message due to her VM being full.     By Raven Walters, Raven Walters    03/25/2017 02:24 PM Phone ([address]Outgoing) Raven Walters, Raven Walters (Self) [phone] (H)    No Answer/Busy - Called pt and was not able to leave Walters message due to her VM being full.     By Raven Walters, Raven Walters    03/30/2017 03:41 PM Phone ([address]Outgoing) Raven Walters, Raven Walters (Self) [phone] (H)    Left Message - Called pt and lmsg for her to CB to get echo scheduled.     By Elita BooneGriffin, Raven Walters

## 2017-06-18 ENCOUNTER — Encounter: Payer: Self-pay | Admitting: Family Medicine

## 2017-06-18 ENCOUNTER — Ambulatory Visit (INDEPENDENT_AMBULATORY_CARE_PROVIDER_SITE_OTHER): Payer: BC Managed Care – PPO | Admitting: Family Medicine

## 2017-06-18 VITALS — BP 122/77 | HR 103 | Temp 98.5°F | Resp 16 | Ht 67.5 in | Wt 229.6 lb

## 2017-06-18 DIAGNOSIS — I10 Essential (primary) hypertension: Secondary | ICD-10-CM

## 2017-06-18 DIAGNOSIS — Z6835 Body mass index (BMI) 35.0-35.9, adult: Secondary | ICD-10-CM | POA: Diagnosis not present

## 2017-06-18 DIAGNOSIS — E6609 Other obesity due to excess calories: Secondary | ICD-10-CM | POA: Diagnosis not present

## 2017-06-18 DIAGNOSIS — F411 Generalized anxiety disorder: Secondary | ICD-10-CM

## 2017-06-18 MED ORDER — ESCITALOPRAM OXALATE 10 MG PO TABS: ORAL_TABLET | ORAL | 0 refills | Status: DC

## 2017-06-18 NOTE — Progress Notes (Signed)
Chief Complaint  Patient presents with  . Follow-up    blood pressure  . Medication Refill    metoprolol succinate    HPI   Hypertension: Patient here for follow-up of elevated blood pressure.   BP Readings from Last 3 Encounters:  06/18/17 122/77  03/18/17 128/88  02/04/17 129/80   Pt reports that her blood pressures are well controlled at home She reports that she was prescribed 100mg   She reports that her palpitations are better She was told that her heart palpitations are from stress and not exercise She exercises  Obesity She has cut own salt, bread and exercising but is not losing much weight She does 30 minutes of cardio and HIIT class 3 days a week She states that she has cut back on salt and fried foods She is doing a ketogenic diet She is eating 3 meals a day She would like to talk to someone about weight loss Body mass index is 35.43 kg/m.  Wt Readings from Last 3 Encounters:  06/18/17 229 lb 9.6 oz (104.1 kg)  03/18/17 226 lb 12.8 oz (102.9 kg)  02/04/17 222 lb 6.4 oz (100.9 kg)   Generalized Anxiety Disorder She reports that she has stress and anxiety related to her work She states that she continues to exercise which is not helping She has been on zoloft which helped She state that she has been in counseling before    Past Medical History:  Diagnosis Date  . Asthma    pt states it is induced by excercise  . Chest pain 02/07/2017  . Essential hypertension 02/07/2017  . Hx MRSA infection   . Palpitations 02/07/2017  . Preeclampsia   . PVC (premature ventricular contraction) 03/18/2017    Current Outpatient Prescriptions  Medication Sig Dispense Refill  . ibuprofen (ADVIL,MOTRIN) 600 MG tablet Take 1 tablet (600 mg total) by mouth every 6 (six) hours as needed for moderate pain or cramping. 30 tablet 0  . metoprolol succinate (TOPROL-XL) 100 MG 24 hr tablet Take 1 tablet (100 mg total) by mouth daily. Take with or immediately following a meal. 90  tablet 3  . Multiple Vitamin (MULTIVITAMIN) tablet Take 1 tablet by mouth daily.    Marland Kitchen escitalopram (LEXAPRO) 10 MG tablet Take 1/2 tablet for 3 days then increase to one tablet by mouth daily 30 tablet 0  . Prenatal Vit-Fe Fumarate-FA (PRENATAL MULTIVITAMIN) TABS tablet Take 1 tablet by mouth daily at 12 noon.     No current facility-administered medications for this visit.     Allergies: No Known Allergies  Past Surgical History:  Procedure Laterality Date  . NO PAST SURGERIES      Social History   Social History  . Marital status: Single    Spouse name: N/A  . Number of children: N/A  . Years of education: N/A   Social History Main Topics  . Smoking status: Never Smoker  . Smokeless tobacco: Never Used  . Alcohol use Yes     Comment: social  . Drug use: No  . Sexual activity: Yes    Birth control/ protection: None   Other Topics Concern  . None   Social History Narrative  . None    ROS See hpi  Objective: Vitals:   06/18/17 1645  BP: 122/77  Pulse: (!) 103  Resp: 16  Temp: 98.5 F (36.9 C)  TempSrc: Oral  SpO2: 98%  Weight: 229 lb 9.6 oz (104.1 kg)  Height: 5' 7.5" (1.715 m)  Physical Exam Physical Exam  Constitutional: She is oriented to person, place, and time. She appears well-developed and well-nourished.  HENT:  Head: Normocephalic and atraumatic.  Eyes: Conjunctivae and EOM are normal.  Cardiovascular: Normal rate, regular rhythm and normal heart sounds.   Pulmonary/Chest: Effort normal and breath sounds normal. No respiratory distress. She has no wheezes.  Neurological: She is alert and oriented to person, place, and time.     Assessment and Plan [name] was seen today for follow-up and medication refill.  Diagnoses and all orders for this visit:  Class 2 obesity due to excess calories without serious comorbidity with body mass index (BMI) of 35.0 to 35.9 in adult- discussed weight management option Would like pt to get additional  support and education -     Amb Ref to Medical Weight Management  Generalized anxiety disorder- discussed risks and benefits of different meds, will try lexapro for anxiety since it kicks in quickly with a lower weight gain risk profile  Essential hypertension- bp well controlled  Other orders -     escitalopram (LEXAPRO) 10 MG tablet; Take 1/2 tablet for 3 days then increase to one tablet by mouth daily     Onnika Siebel A Creta LevinStallings

## 2017-06-18 NOTE — Patient Instructions (Addendum)
   IF you received an x-ray today, you will receive an invoice from [facility]. Please contact [facility] at [phone] with questions or concerns regarding your invoice.   IF you received labwork today, you will receive an invoice from [facility]. Please contact [facility] at [phone] with questions or concerns regarding your invoice.   Our billing staff will not be able to assist you with questions regarding bills from these companies.  You will be contacted with the lab results as soon as they are available. The fastest way to get your results is to activate your My Chart account. Instructions are located on the last page of this paperwork. If you have not heard from us regarding the results in 2 weeks, please contact this office.     Generalized Anxiety Disorder, Adult Generalized anxiety disorder (GAD) is a mental health disorder. People with this condition constantly worry about everyday events. Unlike normal anxiety, worry related to GAD is not triggered by a specific event. These worries also do not fade or get better with time. GAD interferes with life functions, including relationships, work, and school. GAD can vary from mild to severe. People with severe GAD can have intense waves of anxiety with physical symptoms (panic attacks). What are the causes? The exact cause of GAD is not known. What increases the risk? This condition is more likely to develop in:  Women.  People who have a family history of anxiety disorders.  People who are very shy.  People who experience very stressful life events, such as the death of a loved one.  People who have a very stressful family environment.  What are the signs or symptoms? People with GAD often worry excessively about many things in their lives, such as their health and family. They may also be overly concerned about:  Doing well at work.  Being on time.  Natural disasters.  Friendships.  Physical  symptoms of GAD include:  Fatigue.  Muscle tension or having muscle twitches.  Trembling or feeling shaky.  Being easily startled.  Feeling like your heart is pounding or racing.  Feeling out of breath or like you cannot take a deep breath.  Having trouble falling asleep or staying asleep.  Sweating.  Nausea, diarrhea, or irritable bowel syndrome (IBS).  Headaches.  Trouble concentrating or remembering facts.  Restlessness.  Irritability.  How is this diagnosed? Your health care provider can diagnose GAD based on your symptoms and medical history. You will also have a physical exam. The health care provider will ask specific questions about your symptoms, including how severe they are, when they started, and if they come and go. Your health care provider may ask you about your use of alcohol or drugs, including prescription medicines. Your health care provider may refer you to a mental health specialist for further evaluation. Your health care provider will do a thorough examination and may perform additional tests to rule out other possible causes of your symptoms. To be diagnosed with GAD, a person must have anxiety that:  Is out of his or her control.  Affects several different aspects of his or her life, such as work and relationships.  Causes distress that makes him or her unable to take part in normal activities.  Includes at least three physical symptoms of GAD, such as restlessness, fatigue, trouble concentrating, irritability, muscle tension, or sleep problems.  Before your health care provider can confirm a diagnosis of GAD, these symptoms must be present more days than   they are not, and they must last for six months or longer. How is this treated? The following therapies are usually used to treat GAD:  Medicine. Antidepressant medicine is usually prescribed for long-term daily control. Antianxiety medicines may be added in severe cases, especially when panic  attacks occur.  Talk therapy (psychotherapy). Certain types of talk therapy can be helpful in treating GAD by providing support, education, and guidance. Options include: ? Cognitive behavioral therapy (CBT). People learn coping skills and techniques to ease their anxiety. They learn to identify unrealistic or negative thoughts and behaviors and to replace them with positive ones. ? Acceptance and commitment therapy (ACT). This treatment teaches people how to be mindful as a way to cope with unwanted thoughts and feelings. ? Biofeedback. This process trains you to manage your body's response (physiological response) through breathing techniques and relaxation methods. You will work with a therapist while machines are used to monitor your physical symptoms.  Stress management techniques. These include yoga, meditation, and exercise.  A mental health specialist can help determine which treatment is best for you. Some people see improvement with one type of therapy. However, other people require a combination of therapies. Follow these instructions at home:  Take over-the-counter and prescription medicines only as told by your health care provider.  Try to maintain a normal routine.  Try to anticipate stressful situations and allow extra time to manage them.  Practice any stress management or self-calming techniques as taught by your health care provider.  Do not punish yourself for setbacks or for not making progress.  Try to recognize your accomplishments, even if they are small.  Keep all follow-up visits as told by your health care provider. This is important. Contact a health care provider if:  Your symptoms do not get better.  Your symptoms get worse.  You have signs of depression, such as: ? A persistently sad, cranky, or irritable mood. ? Loss of enjoyment in activities that used to bring you joy. ? Change in weight or eating. ? Changes in sleeping habits. ? Avoiding friends  or family members. ? Loss of energy for normal tasks. ? Feelings of guilt or worthlessness. Get help right away if:  You have serious thoughts about hurting yourself or others. If you ever feel like you may hurt yourself or others, or have thoughts about taking your own life, get help right away. You can go to your nearest emergency department or call:  Your local emergency services (911 in the [place]  A suicide crisis helpline, such as the National Suicide Prevention Lifeline at 1-800-273-8255. This is open 24 hours a day.  Summary  Generalized anxiety disorder (GAD) is a mental health disorder that involves worry that is not triggered by a specific event.  People with GAD often worry excessively about many things in their lives, such as their health and family.  GAD may cause physical symptoms such as restlessness, trouble concentrating, sleep problems, frequent sweating, nausea, diarrhea, headaches, and trembling or muscle twitching.  A mental health specialist can help determine which treatment is best for you. Some people see improvement with one type of therapy. However, other people require a combination of therapies. This information is not intended to replace advice given to you by your health care provider. Make sure you discuss any questions you have with your health care provider. Document Released: 02/20/2013 Document Revised: 09/15/2016 Document Reviewed: 09/15/2016 Elsevier Interactive Patient Education  2018 Elsevier Inc.  

## 2017-06-25 ENCOUNTER — Ambulatory Visit (INDEPENDENT_AMBULATORY_CARE_PROVIDER_SITE_OTHER): Payer: BC Managed Care – PPO | Admitting: Physician Assistant

## 2017-06-25 ENCOUNTER — Encounter: Payer: Self-pay | Admitting: Physician Assistant

## 2017-06-25 VITALS — BP 118/78 | HR 74 | Temp 98.2°F | Resp 16 | Ht 67.5 in | Wt 226.0 lb

## 2017-06-25 DIAGNOSIS — I10 Essential (primary) hypertension: Secondary | ICD-10-CM

## 2017-06-25 DIAGNOSIS — Z3A01 Less than 8 weeks gestation of pregnancy: Secondary | ICD-10-CM

## 2017-06-25 MED ORDER — LABETALOL HCL 100 MG PO TABS: 100.0000 mg | ORAL_TABLET | Freq: Two times a day (BID) | ORAL | 2 refills | Status: DC

## 2017-06-25 NOTE — Patient Instructions (Addendum)
We have switched you from Toprol to labetalol today - please take this new medications 2x/day - please check your BP - our goal BP is from 120-130/70-80 - if you find your home BP are high than this please let us know so we can change your medication - please check your BP at different time of the day esp in the afternoon as for some people they need to take this medication 3x/day - if you do not get an appt with your OB within a month we will want to see you to check on you.      IF you received an x-ray today, you will receive an invoice from [facility]. Please contact [facility] at [phone] with questions or concerns regarding your invoice.   IF you received labwork today, you will receive an invoice from [facility]. Please contact [facility] at [phone] with questions or concerns regarding your invoice.   Our billing staff will not be able to assist you with questions regarding bills from these companies.  You will be contacted with the lab results as soon as they are available. The fastest way to get your results is to activate your My Chart account. Instructions are located on the last page of this paperwork. If you have not heard from [place] regarding the results in 2 weeks, please contact this office.

## 2017-06-25 NOTE — Progress Notes (Signed)
Raven Walters  MRN: [id number] DOB: [birth year]  PCP: Doristine Bosworth, MD  Chief Complaint  Patient presents with  . Medication Problem    would like to discuss her BP medication due to finding out she is pregnant     Subjective:  Pt presents to clinic for medication change since she found out she was pregnant this week.  She has not contacted her OB for her appt but she knows that she her BP medication last pregnancy was changed and she would like to go ahead and do that.  She has a BP cuff at home that she can check her BP with.  She is taking a PNV.  She has anxiety which is well controlled with Lexapro.  LMP 7/14  History is obtained by patient.  Review of Systems  Constitutional: Negative for chills and fever.  Eyes: Negative for visual disturbance.  Respiratory: Negative for shortness of breath.   Cardiovascular: Negative for chest pain, palpitations and leg swelling.  Neurological: Negative for dizziness, light-headedness and headaches.    Patient Active Problem List   Diagnosis Date Noted  . PVC (premature ventricular contraction) 03/18/2017  . Palpitations 02/07/2017  . Chest pain 02/07/2017  . Essential hypertension 02/07/2017  . Intrauterine normal pregnancy 11/23/2015    Current Outpatient Prescriptions on File Prior to Visit  Medication Sig Dispense Refill  . escitalopram (LEXAPRO) 10 MG tablet Take 1/2 tablet for 3 days then increase to one tablet by mouth daily 30 tablet 0  . ibuprofen (ADVIL,MOTRIN) 600 MG tablet Take 1 tablet (600 mg total) by mouth every 6 (six) hours as needed for moderate pain or cramping. 30 tablet 0  . Multiple Vitamin (MULTIVITAMIN) tablet Take 1 tablet by mouth daily.    . Prenatal Vit-Fe Fumarate-FA (PRENATAL MULTIVITAMIN) TABS tablet Take 1 tablet by mouth daily at 12 noon.     No current facility-administered medications on file prior to visit.     No Known Allergies  Past Medical History:  Diagnosis Date  . Asthma    pt  states it is induced by excercise  . Chest pain 02/07/2017  . Essential hypertension 02/07/2017  . Hx MRSA infection   . Palpitations 02/07/2017  . Preeclampsia   . PVC (premature ventricular contraction) 03/18/2017   Social History   Social History Narrative  . No narrative on file   Social History  Substance Use Topics  . Smoking status: Never Smoker  . Smokeless tobacco: Never Used  . Alcohol use Yes     Comment: social   family history includes Cardiomyopathy in her mother; Diabetes in her maternal grandmother; Heart disease in her father, maternal grandfather, and paternal grandmother; Hyperlipidemia in her father; Hypertension in her brother, father, maternal grandfather, mother, and paternal grandmother; Miscarriages / [place] in her son; Stroke in her maternal grandfather and paternal grandmother.     Objective:  BP 118/78   Pulse 74   Temp 98.2 F (36.8 C) (Oral)   Resp 16   Ht 5' 7.5" (1.715 m)   Wt 226 lb (102.5 kg)   SpO2 97%   BMI 34.87 kg/m  Body mass index is 34.87 kg/m.  Physical Exam  Constitutional: She is oriented to person, place, and time and well-developed, well-nourished, and in no distress.  HENT:  Head: Normocephalic and atraumatic.  Right Ear: Hearing and external ear normal.  Left Ear: Hearing and external ear normal.  Eyes: Conjunctivae are normal.  Neck: Normal range of motion.  Cardiovascular: Normal rate, regular rhythm and normal heart sounds.   No murmur heard. Pulmonary/Chest: Effort normal and breath sounds normal.  Musculoskeletal:       Right lower leg: She exhibits no edema.       Left lower leg: She exhibits no edema.  Neurological: She is alert and oriented to person, place, and time. Gait normal.  Skin: Skin is warm and dry.  Psychiatric: Mood, memory, affect and judgment normal.  Vitals reviewed.   Assessment and Plan :  Essential hypertension - Plan: labetalol (NORMODYNE) 100 MG tablet  Less than [redacted] weeks gestation of  pregnancy - Plan: labetalol (NORMODYNE) 100 MG tablet   Change BP medication - please check BP at home to help determine if this is the correct dose - this was the dose she was on at last pregnancy but her BP medications have been increased since then - she will f/u with me over mychart in a week if her BP is over 130/80 consistently and we will increase her dose - she was also advise to check her BP in the afternoon as some people need tid dosing of labetalol.  She will either f/u with OB or [place] within a month.  She will discuss her lexapro with her OB as her anxiety is currently well controlled.  Benny Lennert PA-C  Primary Care at [facility] 06/25/2017 9:29 AM

## 2017-07-07 ENCOUNTER — Inpatient Hospital Stay (HOSPITAL_COMMUNITY): Payer: BC Managed Care – PPO

## 2017-07-07 ENCOUNTER — Encounter (HOSPITAL_COMMUNITY): Payer: Self-pay | Admitting: *Deleted

## 2017-07-07 ENCOUNTER — Inpatient Hospital Stay (HOSPITAL_COMMUNITY)
Admission: AD | Admit: 2017-07-07 | Discharge: 2017-07-07 | Disposition: A | Discharge status: Home / Self Care | Payer: BC Managed Care – PPO | Source: Ambulatory Visit | Attending: Obstetrics and Gynecology | Admitting: Obstetrics and Gynecology

## 2017-07-07 DIAGNOSIS — Z79899 Other long term (current) drug therapy: Secondary | ICD-10-CM | POA: Diagnosis not present

## 2017-07-07 DIAGNOSIS — Z3A01 Less than 8 weeks gestation of pregnancy: Secondary | ICD-10-CM | POA: Insufficient documentation

## 2017-07-07 DIAGNOSIS — R109 Unspecified abdominal pain: Secondary | ICD-10-CM | POA: Diagnosis present

## 2017-07-07 DIAGNOSIS — O99341 Other mental disorders complicating pregnancy, first trimester: Secondary | ICD-10-CM | POA: Insufficient documentation

## 2017-07-07 DIAGNOSIS — O200 Threatened abortion: Secondary | ICD-10-CM

## 2017-07-07 DIAGNOSIS — O209 Hemorrhage in early pregnancy, unspecified: Secondary | ICD-10-CM | POA: Diagnosis not present

## 2017-07-07 DIAGNOSIS — O10911 Unspecified pre-existing hypertension complicating pregnancy, first trimester: Secondary | ICD-10-CM | POA: Insufficient documentation

## 2017-07-07 DIAGNOSIS — F419 Anxiety disorder, unspecified: Secondary | ICD-10-CM | POA: Insufficient documentation

## 2017-07-07 DIAGNOSIS — R58 Hemorrhage, not elsewhere classified: Secondary | ICD-10-CM

## 2017-07-07 DIAGNOSIS — Z8249 Family history of ischemic heart disease and other diseases of the circulatory system: Secondary | ICD-10-CM | POA: Diagnosis not present

## 2017-07-07 HISTORY — DX: Headache, unspecified: R51.9

## 2017-07-07 HISTORY — DX: Unspecified infectious disease: B99.9

## 2017-07-07 HISTORY — DX: Headache: R51

## 2017-07-07 LAB — URINALYSIS, ROUTINE W REFLEX MICROSCOPIC
Bilirubin Urine: NEGATIVE
Glucose, UA: NEGATIVE mg/dL
KETONES UR: NEGATIVE mg/dL
NITRITE: NEGATIVE
PROTEIN: 30 mg/dL — AB
Specific Gravity, Urine: 1.021 (ref 1.005–1.030)
pH: 6 (ref 5.0–8.0)

## 2017-07-07 LAB — HCG, QUANTITATIVE, PREGNANCY: HCG, BETA CHAIN, QUANT, S: 2638 m[IU]/mL — AB (ref <5)

## 2017-07-07 LAB — POCT PREGNANCY, URINE: PREG TEST UR: POSITIVE — AB

## 2017-07-07 MED ORDER — LABETALOL HCL 100 MG PO TABS
100.0000 mg | ORAL_TABLET | Freq: Once | ORAL | Status: AC
  Administered 2017-07-07: 100 mg via ORAL
  Filled 2017-07-07: qty 1

## 2017-07-07 NOTE — MAU Note (Signed)
Pt states she was spotting last week, started spotting again on Monday, has become gradually heavier.  Not wearing a pad today.  Also abd cramping since Monday.j  Pos HPT 2 weeks ago.

## 2017-07-07 NOTE — Discharge Instructions (Signed)

## 2017-07-07 NOTE — MAU Note (Signed)
Chief Complaint: Abdominal Pain and Vaginal Bleeding   None    SUBJECTIVE HPI: Raven Walters is a 31 y.o. [id number] at LMP of 05/23/2017 at 6.3 who presents to Maternity Admissions reporting bleeding that started on Monday.  Stopped and started heavier today.  Complains of some cramping.  Denies clots. Denies intercourse in last 48 hours. Has hx of hypertension on labetalol 100mg  once per day.  Has not taken dose today.  Medical hx of anxiety.  Currently taking Lexapro.  Started a month ago.  Prior pregnancy hx of pre eclampsia with first pregnancy and gestational hypertension with second. Pt is A positive documented.  Location: uterine cramping Quality: mild Severity: 4/10 on pain scale Duration: 2 days Timing: Stopped bleeding than restarted again.  Past Medical History:  Diagnosis Date  . Asthma    pt states it is induced by excercise  . Chest pain 02/07/2017  . Essential hypertension 02/07/2017  . Hx MRSA infection   . Palpitations 02/07/2017  . Preeclampsia   . PVC (premature ventricular contraction) 03/18/2017   OB History  Gravida Para Term Preterm AB Living  3 2 2   1 2   SAB TAB Ectopic Multiple Live Births  1     0 2    # Outcome Date GA Lbr Len/2nd Weight Sex Delivery Anes PTL Lv  3 Term 11/23/15 [id number] 01:50 / 00:31 2.897 kg (6 lb 6.2 oz) M Vag-Spont EPI  LIV  2 Term 2004 [id number]  3.374 kg (7 lb 7 oz) M Vag-Spont EPI  LIV  1 SAB              Past Surgical History:  Procedure Laterality Date  . NO PAST SURGERIES     Social History   Social History  . Marital status: Single    Spouse name: N/A  . Number of children: N/A  . Years of education: N/A   Occupational History  . Not on file.   Social History Main Topics  . Smoking status: Never Smoker  . Smokeless tobacco: Never Used  . Alcohol use Yes     Comment: social  . Drug use: No  . Sexual activity: Yes    Birth control/ protection: None   Other Topics Concern  . Not on file   Social History Narrative  .  No narrative on file   Family History  Problem Relation Age of Onset  . Hypertension Mother   . Cardiomyopathy Mother   . Hypertension Father   . Heart disease Father   . Hyperlipidemia Father   . Miscarriages / [place] Son   . Hypertension Brother   . Diabetes Maternal Grandmother   . Heart disease Maternal Grandfather   . Hypertension Maternal Grandfather   . Stroke Maternal Grandfather   . Heart disease Paternal Grandmother   . Hypertension Paternal Grandmother   . Stroke Paternal Grandmother    No current facility-administered medications on file prior to encounter.    Current Outpatient Prescriptions on File Prior to Encounter  Medication Sig Dispense Refill  . escitalopram (LEXAPRO) 10 MG tablet Take 1/2 tablet for 3 days then increase to one tablet by mouth daily 30 tablet 0  . ibuprofen (ADVIL,MOTRIN) 600 MG tablet Take 1 tablet (600 mg total) by mouth every 6 (six) hours as needed for moderate pain or cramping. 30 tablet 0  . labetalol (NORMODYNE) 100 MG tablet Take 1 tablet (100 mg total) by mouth 2 (two) times daily. 60 tablet 2  .  Multiple Vitamin (MULTIVITAMIN) tablet Take 1 tablet by mouth daily.    . Prenatal Vit-Fe Fumarate-FA (PRENATAL MULTIVITAMIN) TABS tablet Take 1 tablet by mouth daily at 12 noon.     No Known Allergies  I have reviewed patient's Past Medical Hx, Surgical Hx, Family Hx, Social Hx, medications and allergies.   Review of Systems  Constitutional: Negative.   HENT: Negative.   Eyes: Negative.   Respiratory: Negative.   Cardiovascular: Negative.   Gastrointestinal: Positive for abdominal pain.  Endocrine: Negative.   Genitourinary: Positive for vaginal bleeding.  Musculoskeletal: Negative.   Skin: Negative.   Allergic/Immunologic: Negative.   Neurological: Negative.   Hematological: Negative.   Psychiatric/Behavioral: Negative.     OBJECTIVE Patient Vitals for the past 24 hrs:  BP Temp Temp src Pulse Resp Weight  07/07/17 1619  (!) 154/100 98.3 F (36.8 C) Oral 90 18 105.2 kg (232 lb)   Constitutional: Well-developed, well-nourished female in no acute distress.  Cardiovascular: normal rate Respiratory: normal rate and effort.  GI: Abd soft, non-tender, gravid appropriate for gestational age. Pos BS x 4 MS: Extremities nontender, no edema, normal ROM Neurologic: Alert and oriented x 4.  GU: Neg CVAT.  SPECULUM EXAM: NEFG, physiologic discharge,  blood noted, cervix no lesions.   BIMANUAL: cervix closed; uterus 6 week  size, no adnexal tenderness or masses.  No CMT.  LAB RESULTS Results for orders placed or performed during the hospital encounter of 07/07/17 (from the past 24 hour(s))  Urinalysis, Routine w reflex microscopic     Status: Abnormal   Collection Time: 07/07/17  4:23 PM  Result Value Ref Range   Color, Urine YELLOW YELLOW   APPearance HAZY (A) CLEAR   Specific Gravity, Urine 1.021 1.005 - 1.030   pH 6.0 5.0 - 8.0   Glucose, UA NEGATIVE NEGATIVE mg/dL   Hgb urine dipstick LARGE (A) NEGATIVE   Bilirubin Urine NEGATIVE NEGATIVE   Ketones, ur NEGATIVE NEGATIVE mg/dL   Protein, ur 30 (A) NEGATIVE mg/dL   Nitrite NEGATIVE NEGATIVE   Leukocytes, UA MODERATE (A) NEGATIVE   RBC / HPF TOO NUMEROUS TO COUNT 0 - 5 RBC/hpf   WBC, UA TOO NUMEROUS TO COUNT 0 - 5 WBC/hpf   Bacteria, UA RARE (A) NONE SEEN   Squamous Epithelial / LPF 6-30 (A) NONE SEEN   Mucus PRESENT   Pregnancy, urine POC     Status: Abnormal   Collection Time: 07/07/17  4:37 PM  Result Value Ref Range   Preg Test, Ur POSITIVE (A) NEGATIVE  QBHCG 2638  IMAGING [place] 5.2 week sac no fetal pole or yoke sac  MAU COURSE Orders Placed This Encounter  Procedures  . US OB Comp Less 14 Wks  . Urinalysis, Routine w reflex microscopic  . hCG, quantitative, pregnancy  . Pregnancy, urine POC   Meds ordered this encounter  Medications  . labetalol (NORMODYNE) tablet 100 mg    MDM PE, UA, labetalol, [place] ASSESSMENT Threatened  Abortion Chronic Hypertension in pregnancy  PLAN Discharge home in stable condition.  Discussed [place] and lab results with pt.  Discussed repeating QBHCG on Friday am.  Will have BP evaluated at office to see if labetalol needs to be increased.  Bleeding precautions discussed. D/C in stable condition.     Kenney Houseman, CNM 07/07/2017  4:51 PM

## 2017-07-13 DIAGNOSIS — I10 Essential (primary) hypertension: Secondary | ICD-10-CM | POA: Diagnosis not present

## 2017-07-13 DIAGNOSIS — O021 Missed abortion: Secondary | ICD-10-CM | POA: Diagnosis not present

## 2017-07-14 DIAGNOSIS — O021 Missed abortion: Secondary | ICD-10-CM | POA: Diagnosis not present

## 2017-07-15 ENCOUNTER — Other Ambulatory Visit: Payer: Self-pay | Admitting: Family Medicine

## 2017-07-15 NOTE — Telephone Encounter (Signed)
rx request for escitalopram 10 mg approved # [id number] with 3 refills. dg

## 2017-09-09 DIAGNOSIS — O021 Missed abortion: Secondary | ICD-10-CM | POA: Diagnosis not present

## 2017-09-09 DIAGNOSIS — Z3040 Encounter for surveillance of contraceptives, unspecified: Secondary | ICD-10-CM | POA: Diagnosis not present

## 2017-09-13 DIAGNOSIS — O021 Missed abortion: Secondary | ICD-10-CM | POA: Diagnosis not present

## 2017-09-23 DIAGNOSIS — Z113 Encounter for screening for infections with a predominantly sexual mode of transmission: Secondary | ICD-10-CM | POA: Diagnosis not present

## 2017-09-23 DIAGNOSIS — Z30430 Encounter for insertion of intrauterine contraceptive device: Secondary | ICD-10-CM | POA: Diagnosis not present

## 2017-11-04 DIAGNOSIS — T8389XA Other specified complication of genitourinary prosthetic devices, implants and grafts, initial encounter: Secondary | ICD-10-CM | POA: Diagnosis not present

## 2017-11-04 DIAGNOSIS — Z30431 Encounter for routine checking of intrauterine contraceptive device: Secondary | ICD-10-CM | POA: Diagnosis not present

## 2017-11-25 ENCOUNTER — Encounter (INDEPENDENT_AMBULATORY_CARE_PROVIDER_SITE_OTHER): Payer: Self-pay

## 2017-11-25 ENCOUNTER — Encounter (INDEPENDENT_AMBULATORY_CARE_PROVIDER_SITE_OTHER): Payer: BC Managed Care – PPO

## 2017-12-07 ENCOUNTER — Ambulatory Visit (INDEPENDENT_AMBULATORY_CARE_PROVIDER_SITE_OTHER): Payer: Self-pay | Admitting: Family Medicine

## 2017-12-07 ENCOUNTER — Encounter (INDEPENDENT_AMBULATORY_CARE_PROVIDER_SITE_OTHER): Payer: Self-pay | Admitting: Family Medicine

## 2017-12-07 VITALS — BP 130/84 | HR 73 | Temp 98.1°F | Ht 67.0 in | Wt 220.0 lb

## 2017-12-07 DIAGNOSIS — E669 Obesity, unspecified: Secondary | ICD-10-CM | POA: Diagnosis not present

## 2017-12-07 DIAGNOSIS — R5383 Other fatigue: Secondary | ICD-10-CM | POA: Diagnosis not present

## 2017-12-07 DIAGNOSIS — R0602 Shortness of breath: Secondary | ICD-10-CM | POA: Insufficient documentation

## 2017-12-07 DIAGNOSIS — Z9189 Other specified personal risk factors, not elsewhere classified: Secondary | ICD-10-CM

## 2017-12-07 DIAGNOSIS — I10 Essential (primary) hypertension: Secondary | ICD-10-CM

## 2017-12-07 DIAGNOSIS — Z1331 Encounter for screening for depression: Secondary | ICD-10-CM

## 2017-12-07 DIAGNOSIS — Z6834 Body mass index (BMI) 34.0-34.9, adult: Secondary | ICD-10-CM

## 2017-12-07 DIAGNOSIS — Z0289 Encounter for other administrative examinations: Secondary | ICD-10-CM

## 2017-12-07 NOTE — Progress Notes (Signed)
Office: [phone]  /  Fax: [phone]   Dear Dr. Creta Levin,   Thank you for referring Raven Walters to our clinic. The following note includes my evaluation and treatment recommendations.  HPI:   Chief Complaint: OBESITY    Raven Walters has been referred by Zoe A. Creta Levin, MD for consultation regarding her obesity and obesity related comorbidities.    Raven Walters (MR# [id number]) is a 32 y.o. female who presents on 12/07/2017 for obesity evaluation and treatment. Current BMI is Body mass index is 34.46 kg/m.Marland Kitchen Raven Walters has been struggling with her weight for many years and has been unsuccessful in either losing weight, maintaining weight loss, or reaching her healthy weight goal.     Raven Walters attended our information session and states she is currently in the action stage of change and ready to dedicate time achieving and maintaining a healthier weight. Harlym is interested in becoming our patient and working on intensive lifestyle modifications including (but not limited to) diet, exercise and weight loss.    Faron states her family eats meals together she thinks her family will eat healthier with  her her desired weight loss is 75 lbs she has been heavy most of  her life she started gaining weight at the birth of her 1st child her heaviest weight ever was 225 lbs. she has significant food cravings issues  she snacks frequently in the evenings she skips meals frequently she is frequently drinking liquids with calories she frequently makes poor food choices she frequently eats larger portions than normal  she has binge eating behaviors she struggles with emotional eating    Fatigue Raven Walters feels her energy is lower than it should be. This has worsened with weight gain and has not worsened recently. Raven Walters admits to daytime somnolence and  denies waking up still tired. Patient is at risk for obstructive sleep apnea. Patent has a history of symptoms of daytime fatigue and  hypertension. Patient generally gets 3 or 4 hours of sleep per night, and states they generally have restless sleep. Snoring is present. Apneic episodes are not present. Epworth Sleepiness Score is 8  Dyspnea on exertion Raven Walters notes increasing shortness of breath with exercising and seems to be worsening over time with weight gain. She notes getting out of breath sooner with activity than she used to. This has not gotten worse recently. Raven Walters denies orthopnea.  Hypertension Raven Walters is a 32 y.o. female with hypertension. Raven Walters denies chest pain or current palpitations. She is working weight loss to help control her blood pressure with the goal of decreasing her risk of heart attack and stroke. Raven Walters blood pressure is stable on labetalol  At risk for cardiovascular disease Raven Walters is at a higher than average risk for cardiovascular disease due to obesity and hypertension. She currently denies any chest pain.  Depression Screen Raven Walters Food and Mood (modified PHQ-9) score was  Depression screen PHQ 2/9 12/07/2017  Decreased Interest 3  Down, Depressed, Hopeless 1  PHQ - 2 Score 4  Altered sleeping 3  Tired, decreased energy 3  Change in appetite 3  Feeling bad or failure about yourself  2  Trouble concentrating 3  Moving slowly or fidgety/restless 1  Suicidal thoughts 0  PHQ-9 Score 19  Difficult doing work/chores Not difficult at all    ALLERGIES: No Known Allergies  MEDICATIONS: Current Outpatient Medications on File Prior to Visit  Medication Sig Dispense Refill  . escitalopram (LEXAPRO) 10 MG tablet TAKE 1/2  TABLET FOR 3 DAYS THEN INCREASE TO ONE TABLET BY MOUTH DAILY 30 tablet 3  . labetalol (NORMODYNE) 100 MG tablet Take 1 tablet (100 mg total) by mouth 2 (two) times daily. 60 tablet 2   No current facility-administered medications on file prior to visit.     PAST MEDICAL HISTORY: Past Medical History:  Diagnosis Date  . Anxiety   . Asthma    pt  states it is induced by excercise  . Chest pain 02/07/2017  . Dyspnea   . Essential hypertension 02/07/2017  . Headache   . Hx MRSA infection   . Infection    UTI  . Palpitations 02/07/2017  . Preeclampsia   . PVC (premature ventricular contraction) 03/18/2017    PAST SURGICAL HISTORY: Past Surgical History:  Procedure Laterality Date  . NO PAST SURGERIES      SOCIAL HISTORY: Social History   Tobacco Use  . Smoking status: Never Smoker  . Smokeless tobacco: Never Used  Substance Use Topics  . Alcohol use: Yes    Comment: social  . Drug use: No    FAMILY HISTORY: Family History  Problem Relation Age of Onset  . Hypertension Mother   . Cardiomyopathy Mother   . Heart disease Mother   . Anxiety disorder Mother   . Obesity Mother   . Hypertension Father   . Heart disease Father   . Hyperlipidemia Father   . Miscarriages / [place] Son   . Hypertension Brother   . Diabetes Maternal Grandmother   . Heart disease Maternal Grandfather   . Hypertension Maternal Grandfather   . Stroke Maternal Grandfather   . Heart disease Paternal Grandmother   . Hypertension Paternal Grandmother   . Stroke Paternal Grandmother   . Alzheimer's disease Paternal Grandmother   . Hearing loss Neg Hx     ROS: Review of Systems  Constitutional: Positive for malaise/fatigue.  HENT: Positive for congestion (nasal stuffiness), sinus pain and tinnitus.        Nasal Discharge  Eyes:       Wear Glasses or Contacts  Respiratory: Positive for wheezing.   Cardiovascular: Positive for palpitations. Negative for chest pain and orthopnea.  Neurological: Positive for headaches.  Psychiatric/Behavioral: The patient has insomnia.        Stress    PHYSICAL EXAM: Blood pressure 130/84, pulse 73, temperature 98.1 F (36.7 C), temperature source Oral, height 5\' 7"  (1.702 m), weight 220 lb (99.8 kg), last menstrual period 11/29/2017, SpO2 99 %, unknown if currently breastfeeding. Body mass index is  34.46 kg/m. Physical Exam  Constitutional: She is oriented to person, place, and time. She appears well-developed and well-nourished.  HENT:  Head: Normocephalic and atraumatic.  Nose: Nose normal.  Eyes: EOM are normal. No scleral icterus.  Neck: Normal range of motion. Neck supple. No thyromegaly present.  Cardiovascular: Normal rate and regular rhythm.  Pulmonary/Chest: Effort normal. No respiratory distress.  Abdominal: Soft. There is no tenderness.  + obesity  Musculoskeletal: Normal range of motion.  Range of Motion normal in all 4 extremities  Neurological: She is alert and oriented to person, place, and time. Coordination normal.  Skin: Skin is warm and dry.  Psychiatric: She has a normal mood and affect. Her behavior is normal.  Vitals reviewed.   RECENT LABS AND TESTS: BMET    Component Value Date/Time   NA 142 01/13/2017 1604   K 4.0 01/13/2017 1604   CL 102 01/13/2017 1604   CO2 25 01/13/2017 1604  GLUCOSE 76 01/13/2017 1604   GLUCOSE 86 09/20/2015 1839   BUN 11 01/13/2017 1604   CREATININE 0.76 01/13/2017 1604   CALCIUM 9.3 01/13/2017 1604   GFRNONAA 105 01/13/2017 1604   GFRAA 121 01/13/2017 1604   No results found for: HGBA1C No results found for: INSULIN CBC    Component Value Date/Time   WBC 10.5 01/13/2017 1604   WBC 16.0 (H) 11/24/2015 0520   RBC 4.32 01/13/2017 1604   RBC 3.63 (L) 11/24/2015 0520   HGB 13.0 01/13/2017 1604   HCT 38.1 01/13/2017 1604   PLT 251 01/13/2017 1604   MCV 88 01/13/2017 1604   MCH 30.1 01/13/2017 1604   MCH 29.8 11/24/2015 0520   MCHC 34.1 01/13/2017 1604   MCHC 33.1 11/24/2015 0520   RDW 14.2 01/13/2017 1604   LYMPHSABS 3.4 (H) 01/13/2017 1604   EOSABS 0.5 (H) 01/13/2017 1604   BASOSABS 0.0 01/13/2017 1604   Iron/TIBC/Ferritin/ %Sat No results found for: IRON, TIBC, FERRITIN, IRONPCTSAT Lipid Panel     Component Value Date/Time   CHOL 153 01/13/2017 1604   TRIG 198 (H) 01/13/2017 1604   HDL 45 01/13/2017  1604   CHOLHDL 3.4 01/13/2017 1604   LDLCALC 68 01/13/2017 1604   Hepatic Function Panel     Component Value Date/Time   PROT 7.7 09/20/2015 1839   ALBUMIN 3.2 (L) 09/20/2015 1839   AST 17 09/20/2015 1839   ALT 18 09/20/2015 1839   ALKPHOS 51 09/20/2015 1839   BILITOT 0.8 09/20/2015 1839      Component Value Date/Time   TSH 1.090 01/13/2017 1604    ECG  shows NSR with a rate of 67 BPM INDIRECT CALORIMETER done today shows a VO2 of 287 and a REE of 1997.  Her calculated basal metabolic rate is 16101819 thus her basal metabolic rate is better than expected.    ASSESSMENT AND PLAN: Other fatigue - Plan: EKG 12-Lead, Vitamin B12, CBC With Differential, Comprehensive metabolic panel, Folate, Hemoglobin A1c, Insulin, random, Lipid Panel With LDL/HDL Ratio, T3, T4, free, TSH, VITAMIN D 25 Hydroxy (Vit-D Deficiency, Fractures)  Shortness of breath on exertion - Plan: CBC With Differential, Lipid Panel With LDL/HDL Ratio  Essential hypertension - Plan: Comprehensive metabolic panel  Depression screening  At risk for heart disease  Class 1 obesity with serious comorbidity and body mass index (BMI) of 34.0 to 34.9 in adult, unspecified obesity type  PLAN: Fatigue Raven Walters was informed that her fatigue may be related to obesity, depression or many other causes. Labs will be ordered, and in the meanwhile Raven Walters has agreed to work on diet, exercise and weight loss to help with fatigue. Proper sleep hygiene was discussed including the need for 7-8 hours of quality sleep each night. A sleep study was not ordered based on symptoms and Epworth score.  Dyspnea on exertion Raven Walters shortness of breath appears to be obesity related and exercise induced. She has agreed to work on weight loss and gradually increase exercise to treat her exercise induced shortness of breath. If Raven Walters follows our instructions and loses weight without improvement of her shortness of breath, we will plan to refer to  pulmonology. We will monitor this condition regularly. Raven Walters agrees to this plan.  Hypertension We discussed sodium restriction, working on healthy weight loss, and a regular exercise program as the means to achieve improved blood pressure control. Raven Walters agreed with this plan and agreed to follow up as directed. Goal is to control hypertension with diet. We will  continue to monitor her blood pressure as well as her progress with the above lifestyle modifications. She will continue her medications as prescribed and will watch for signs of hypotension as she continues her lifestyle modifications.  Cardiovascular risk counseling Raven Walters was given extended (15 minutes) coronary artery disease prevention counseling today. She is 32 y.o. female and has risk factors for heart disease including obesity and hypertension. We discussed intensive lifestyle modifications today with an emphasis on specific weight loss instructions and strategies. Pt was also informed of the importance of increasing exercise and decreasing saturated fats to help prevent heart disease.  Depression Screen Raven Walters had a strongly positive depression screening. Depression is commonly associated with obesity and often results in emotional eating behaviors. We will monitor this closely and work on CBT to help improve the non-hunger eating patterns. Referral to Psychology may be required if no improvement is seen as she continues in our clinic.  Obesity Raven Walters is currently in the action stage of change and her goal is to continue with weight loss efforts. I recommend Raven Walters begin the structured treatment plan as follows:  She has agreed to follow the Category 3 plan Raven Walters has been instructed to eventually work up to a goal of 150 minutes of combined cardio and strengthening exercise per week for weight loss and overall health benefits. We discussed the following Behavioral Modification Strategies today: no skipping meals, increasing lean  protein intake, decreasing simple carbohydrates and decrease eating out   She was informed of the importance of frequent follow up visits to maximize her success with intensive lifestyle modifications for her multiple health conditions. She was informed we would discuss her lab results at her next visit unless there is a critical issue that needs to be addressed sooner. Jahniah agreed to keep her next visit at the agreed upon time to discuss these results.    OBESITY BEHAVIORAL INTERVENTION VISIT  Today's visit was # 1 out of 22.  Starting weight: 220 lbs Starting date: 12/07/17 Today's weight : 220 lbs  Today's date: 12/07/2017 Total lbs lost to date: 0 (Patients must lose 7 lbs in the first 6 months to continue with counseling)   ASK: We discussed the diagnosis of obesity with Raven Walters today and Lulia agreed to give [place] permission to discuss obesity behavioral modification therapy today.  ASSESS: Ranada has the diagnosis of obesity and her BMI today is 34.45 Alandria is in the action stage of change   ADVISE: Tyreona was educated on the multiple health risks of obesity as well as the benefit of weight loss to improve her health. She was advised of the need for long term treatment and the importance of lifestyle modifications.  AGREE: Multiple dietary modification options and treatment options were discussed and  Athziry agreed to the above obesity treatment plan.   I, [place] Crane, am acting as transcriptionist for  Quillian Quince, MD   I have reviewed the above documentation for accuracy and completeness, and I agree with the above. -Quillian Quince, MD

## 2017-12-08 LAB — CBC WITH DIFFERENTIAL
BASOS ABS: 0 10*3/uL (ref 0.0–0.2)
Basos: 0 %
EOS (ABSOLUTE): 0.2 10*3/uL (ref 0.0–0.4)
Eos: 2 %
Hematocrit: 37.7 % (ref 34.0–46.6)
Hemoglobin: 12.9 g/dL (ref 11.1–15.9)
Immature Grans (Abs): 0 10*3/uL (ref 0.0–0.1)
Immature Granulocytes: 0 %
LYMPHS ABS: 3 10*3/uL (ref 0.7–3.1)
Lymphs: 31 %
MCH: 29.9 pg (ref 26.6–33.0)
MCHC: 34.2 g/dL (ref 31.5–35.7)
MCV: 88 fL (ref 79–97)
Monocytes Absolute: 0.6 10*3/uL (ref 0.1–0.9)
Monocytes: 7 %
NEUTROS ABS: 5.6 10*3/uL (ref 1.4–7.0)
NEUTROS PCT: 60 %
RBC: 4.31 x10E6/uL (ref 3.77–5.28)
RDW: 14.3 % (ref 12.3–15.4)
WBC: 9.4 10*3/uL (ref 3.4–10.8)

## 2017-12-08 LAB — LIPID PANEL WITH LDL/HDL RATIO
Cholesterol, Total: 164 mg/dL (ref 100–199)
HDL: 43 mg/dL (ref >39)
LDL Calculated: 103 mg/dL — ABNORMAL HIGH (ref 0–99)
LDL/HDL RATIO: 2.4 ratio (ref 0.0–3.2)
Triglycerides: 89 mg/dL (ref 0–149)
VLDL Cholesterol Cal: 18 mg/dL (ref 5–40)

## 2017-12-08 LAB — COMPREHENSIVE METABOLIC PANEL
A/G RATIO: 1.2 (ref 1.2–2.2)
ALK PHOS: 61 IU/L (ref 39–117)
ALT: 15 IU/L (ref 0–32)
AST: 15 IU/L (ref 0–40)
Albumin: 4.2 g/dL (ref 3.5–5.5)
BILIRUBIN TOTAL: 0.5 mg/dL (ref 0.0–1.2)
BUN/Creatinine Ratio: 12 (ref 9–23)
BUN: 10 mg/dL (ref 6–20)
CHLORIDE: 102 mmol/L (ref 96–106)
CO2: 21 mmol/L (ref 20–29)
Calcium: 9 mg/dL (ref 8.7–10.2)
Creatinine, Ser: 0.81 mg/dL (ref 0.57–1.00)
GFR calc Af Amer: 111 mL/min/{1.73_m2} (ref >59)
GFR calc non Af Amer: 96 mL/min/{1.73_m2} (ref >59)
GLUCOSE: 82 mg/dL (ref 65–99)
Globulin, Total: 3.6 g/dL (ref 1.5–4.5)
POTASSIUM: 4.2 mmol/L (ref 3.5–5.2)
Sodium: 137 mmol/L (ref 134–144)
Total Protein: 7.8 g/dL (ref 6.0–8.5)

## 2017-12-08 LAB — HEMOGLOBIN A1C
Est. average glucose Bld gHb Est-mCnc: 108 mg/dL
Hgb A1c MFr Bld: 5.4 % (ref 4.8–5.6)

## 2017-12-08 LAB — INSULIN, RANDOM: INSULIN: 13.7 u[IU]/mL (ref 2.6–24.9)

## 2017-12-08 LAB — FOLATE: FOLATE: 11.7 ng/mL (ref >3.0)

## 2017-12-08 LAB — T3: T3 TOTAL: 104 ng/dL (ref 71–180)

## 2017-12-08 LAB — VITAMIN D 25 HYDROXY (VIT D DEFICIENCY, FRACTURES): Vit D, 25-Hydroxy: 15.6 ng/mL — ABNORMAL LOW (ref 30.0–100.0)

## 2017-12-08 LAB — T4, FREE: FREE T4: 0.96 ng/dL (ref 0.82–1.77)

## 2017-12-08 LAB — TSH: TSH: 0.804 u[IU]/mL (ref 0.450–4.500)

## 2017-12-08 LAB — VITAMIN B12: Vitamin B-12: 947 pg/mL (ref 232–1245)

## 2017-12-21 ENCOUNTER — Ambulatory Visit (INDEPENDENT_AMBULATORY_CARE_PROVIDER_SITE_OTHER): Payer: 59 | Admitting: Family Medicine

## 2017-12-21 ENCOUNTER — Telehealth: Payer: BC Managed Care – PPO | Admitting: Nurse Practitioner

## 2017-12-21 VITALS — BP 122/73 | HR 82 | Temp 97.9°F | Ht 67.0 in | Wt 221.0 lb

## 2017-12-21 DIAGNOSIS — N3000 Acute cystitis without hematuria: Secondary | ICD-10-CM

## 2017-12-21 DIAGNOSIS — E669 Obesity, unspecified: Secondary | ICD-10-CM

## 2017-12-21 DIAGNOSIS — Z9189 Other specified personal risk factors, not elsewhere classified: Secondary | ICD-10-CM

## 2017-12-21 DIAGNOSIS — Z6834 Body mass index (BMI) 34.0-34.9, adult: Secondary | ICD-10-CM

## 2017-12-21 DIAGNOSIS — E8881 Metabolic syndrome: Secondary | ICD-10-CM

## 2017-12-21 DIAGNOSIS — E559 Vitamin D deficiency, unspecified: Secondary | ICD-10-CM

## 2017-12-21 MED ORDER — METFORMIN HCL 500 MG PO TABS
500.0000 mg | ORAL_TABLET | Freq: Every day | ORAL | 0 refills | Status: DC
Start: 2017-12-21 — End: 2018-01-24

## 2017-12-21 MED ORDER — CEPHALEXIN 500 MG PO CAPS: 500.0000 mg | ORAL_CAPSULE | Freq: Two times a day (BID) | ORAL | 0 refills | Status: DC

## 2017-12-21 MED ORDER — VITAMIN D (ERGOCALCIFEROL) 1.25 MG (50000 UNIT) PO CAPS: 50000.0000 [IU] | ORAL_CAPSULE | ORAL | 0 refills | Status: DC

## 2017-12-21 NOTE — Progress Notes (Signed)

## 2017-12-21 NOTE — Progress Notes (Signed)
Office: [phone]  /  Fax: [phone]   HPI:   Chief Complaint: OBESITY Raven Walters is here to discuss her progress with her obesity treatment plan. She is on the Category 3 plan and is following her eating plan approximately 65-70 % of the time. She states she is walking for 30 minutes 2 times per week. Raven Walters states she followed her Category 3 plan but deviated significantly on the 2nd week when she was pulled off her normal routine. She didn't eat all herr food the 2nd week but snacked more.  Her weight is 221 lb (100.2 kg) today and has gained 1 pound since her last visit. She has lost 0 lbs since starting treatment with us.  Vitamin D Deficiency Raven Walters has a new diagnosis of vitamin D deficiency. She notes fatigue and denies nausea, vomiting or muscle weakness.She is not on multivitamins or Vit D.  Insulin Resistance Raven Walters has a new diagnosis of insulin resistance based on her elevated fasting insulin level >5. She notes polyphagia is worse in the evening and has felt hypoglycemic in the past. Although Raven Walters are still under good control, insulin resistance puts her at greater risk of metabolic syndrome and diabetes. She is not taking metformin currently and continues to work on diet and exercise to decrease risk of diabetes.  At risk for diabetes Raven Walters is at higher than average risk for developing diabetes due to her obesity and insulin resistance. She currently denies polyuria or polydipsia.  ALLERGIES: No Known Allergies  MEDICATIONS: Current Outpatient Medications on File Prior to Visit  Medication Sig Dispense Refill  . escitalopram (LEXAPRO) 10 MG tablet TAKE 1/2 TABLET FOR 3 DAYS THEN INCREASE TO ONE TABLET BY MOUTH DAILY 30 tablet 3  . labetalol (NORMODYNE) 100 MG tablet Take 1 tablet (100 mg total) by mouth 2 (two) times daily. 60 tablet 2   No current facility-administered medications on file prior to visit.     PAST MEDICAL HISTORY: Past  Medical History:  Diagnosis Date  . Anxiety   . Asthma    pt states it is induced by excercise  . Chest pain 02/07/2017  . Dyspnea   . Essential hypertension 02/07/2017  . Headache   . Hx MRSA infection   . Infection    UTI  . Palpitations 02/07/2017  . Preeclampsia   . PVC (premature ventricular contraction) 03/18/2017    PAST SURGICAL HISTORY: Past Surgical History:  Procedure Laterality Date  . NO PAST SURGERIES      SOCIAL HISTORY: Social History   Tobacco Use  . Smoking status: Never Smoker  . Smokeless tobacco: Never Used  Substance Use Topics  . Alcohol use: Yes    Comment: social  . Drug use: No    FAMILY HISTORY: Family History  Problem Relation Age of Onset  . Hypertension Mother   . Cardiomyopathy Mother   . Heart disease Mother   . Anxiety disorder Mother   . Obesity Mother   . Hypertension Father   . Heart disease Father   . Hyperlipidemia Father   . Miscarriages [place]   . Hypertension Brother   . Diabetes Maternal Grandmother   . Heart disease Maternal Grandfather   . Hypertension Maternal Grandfather   . Stroke Maternal Grandfather   . Heart disease Paternal Grandmother   . Hypertension Paternal Grandmother   . Stroke Paternal Grandmother   . Alzheimer's disease Paternal Grandmother   . Hearing loss Neg Hx  ROS: Review of Systems  Constitutional: Positive for malaise/fatigue. Negative for weight loss.  Gastrointestinal: Negative for nausea and vomiting.  Genitourinary: Negative for frequency.  Musculoskeletal:       Negative muscle weakness  Endo/Heme/Allergies: Negative for polydipsia.       Positive hypoglycemia Positive polyphagia    PHYSICAL EXAM: Blood pressure 122/73, pulse 82, temperature 97.9 F (36.6 C), temperature source Oral, height 5\' 7"  (1.702 m), weight 221 lb (100.2 kg), last menstrual period 11/29/2017, SpO2 98 %, unknown if currently breastfeeding. Body mass index is 34.61 kg/m. Physical Exam    Constitutional: She is oriented to person, place, and time. She appears well-developed and well-nourished.  Cardiovascular: Normal rate.  Pulmonary/Chest: Effort normal.  Musculoskeletal: Normal range of motion.  Neurological: She is oriented to person, place, and time.  Skin: Skin is warm and dry.  Psychiatric: She has a normal mood and affect. Her behavior is normal.  Vitals reviewed.   RECENT LABS AND TESTS: BMET    Component Value Date/Time   NA 137 12/07/2017 1125   K 4.2 12/07/2017 1125   CL 102 12/07/2017 1125   CO2 21 12/07/2017 1125   GLUCOSE 82 12/07/2017 1125   GLUCOSE 86 09/20/2015 1839   BUN 10 12/07/2017 1125   CREATININE 0.81 12/07/2017 1125   CALCIUM 9.0 12/07/2017 1125   GFRNONAA 96 12/07/2017 1125   GFRAA 111 12/07/2017 1125   Lab Results  Component Value Date   HGBA1C 5.4 12/07/2017   Lab Results  Component Value Date   INSULIN 13.7 12/07/2017   CBC    Component Value Date/Time   WBC 9.4 12/07/2017 1125   WBC 16.0 (H) 11/24/2015 0520   RBC 4.31 12/07/2017 1125   RBC 3.63 (L) 11/24/2015 0520   HGB 12.9 12/07/2017 1125   HCT 37.7 12/07/2017 1125   PLT 251 01/13/2017 1604   MCV 88 12/07/2017 1125   MCH 29.9 12/07/2017 1125   MCH 29.8 11/24/2015 0520   MCHC 34.2 12/07/2017 1125   MCHC 33.1 11/24/2015 0520   RDW 14.3 12/07/2017 1125   LYMPHSABS 3.0 12/07/2017 1125   EOSABS 0.2 12/07/2017 1125   BASOSABS 0.0 12/07/2017 1125   Iron/TIBC/Ferritin/ %Sat No results found for: IRON, TIBC, FERRITIN, IRONPCTSAT Lipid Panel     Component Value Date/Time   CHOL 164 12/07/2017 1125   TRIG 89 12/07/2017 1125   HDL 43 12/07/2017 1125   CHOLHDL 3.4 01/13/2017 1604   LDLCALC 103 (H) 12/07/2017 1125   Hepatic Function Panel     Component Value Date/Time   PROT 7.8 12/07/2017 1125   ALBUMIN 4.2 12/07/2017 1125   AST 15 12/07/2017 1125   ALT 15 12/07/2017 1125   ALKPHOS 61 12/07/2017 1125   BILITOT 0.5 12/07/2017 1125      Component Value  Date/Time   TSH 0.804 12/07/2017 1125   TSH 1.090 01/13/2017 1604  Results for Walters, Raven (MRN [id number]) as of 12/21/2017 16:09  Ref. Range 12/07/2017 11:25  Vitamin D, 25-Hydroxy Latest Ref Range: 30.0 - 100.0 ng/mL 15.6 (L)    ASSESSMENT AND PLAN: Vitamin D deficiency - Plan: Vitamin D, Ergocalciferol, (DRISDOL) 50000 units CAPS capsule  Insulin resistance - Plan: metFORMIN (GLUCOPHAGE) 500 MG tablet  At risk for diabetes mellitus  Class 1 obesity with serious comorbidity and body mass index (BMI) of 34.0 to 34.9 in adult, unspecified obesity type  PLAN:  Vitamin D Deficiency Raven Walters was informed that low vitamin D levels contributes to fatigue and are  associated with obesity, breast, and colon cancer. Raven Walters agrees to start prescription Vit D @50 ,000 IU every week #4 with no refills. She will follow up for routine testing of vitamin D, at least 2-3 times per year. She was informed of the risk of over-replacement of vitamin D and agrees to not increase her dose unless she discusses this with [place] first. We will recheck labs in 3 months and Raven Walters agrees to follow up with our clinic in 2 weeks.  Insulin Resistance Raven Walters will continue to work on weight loss, diet, exercise, and decreasing simple carbohydrates in her diet to help decrease the risk of diabetes. We dicussed metformin including benefits and risks. She was informed that eating too many simple carbohydrates or too many calories at one sitting increases the likelihood of GI side effects. Raven Walters agrees to start metformin 500 mg q AM #30 with no refills. We will recheck labs in 3 months and Raven Walters agrees to follow up with our clinic in 2 weeks as directed to monitor her progress.  Diabetes risk counselling Raven Walters was given extended (30 minutes) diabetes prevention counseling today. She is 32 y.o. female and has risk factors for diabetes including obesity and insulin resistance. We discussed intensive lifestyle modifications today  with an emphasis on weight loss as well as increasing exercise and decreasing simple carbohydrates in her diet.  Obesity Raven Walters is currently in the action stage of change. As such, her goal is to continue with weight loss efforts She has agreed to follow the Category 3 plan Raven Walters has been instructed to work up to a goal of 150 minutes of combined cardio and strengthening exercise per week for weight loss and overall health benefits. We discussed the following Behavioral Modification Strategies today: decrease eating out, work on meal planning and easy cooking plans, and no skipping meals   Raven Walters has agreed to follow up with our clinic in 2 weeks. She was informed of the importance of frequent follow up visits to maximize her success with intensive lifestyle modifications for her multiple health conditions.   OBESITY BEHAVIORAL INTERVENTION VISIT  Today's visit was # 2 out of 22.  Starting weight: 220 lbs Starting date: 12/07/17 Today's weight : 221 lbs  Today's date: 12/21/2017 Total lbs lost to date: 0 (Patients must lose 7 lbs in the first 6 months to continue with counseling)   ASK: We discussed the diagnosis of obesity with Raven Walters today and Raven Walters agreed to give [place] permission to discuss obesity behavioral modification therapy today.  ASSESS: Anshika has the diagnosis of obesity and her BMI today is 34.61 Liora is in the action stage of change   ADVISE: Diania was educated on the multiple health risks of obesity as well as the benefit of weight loss to improve her health. She was advised of the need for long term treatment and the importance of lifestyle modifications.  AGREE: Multiple dietary modification options and treatment options were discussed and  Dacie agreed to the above obesity treatment plan.  I, Burt Knack, am acting as transcriptionist for Quillian Quince, MD  I have reviewed the above documentation for accuracy and completeness, and I agree with the  above. -Quillian Quince, MD

## 2018-01-05 ENCOUNTER — Ambulatory Visit (INDEPENDENT_AMBULATORY_CARE_PROVIDER_SITE_OTHER): Payer: 59 | Admitting: Family Medicine

## 2018-01-05 VITALS — BP 116/82 | HR 71 | Temp 97.9°F | Ht 67.0 in | Wt 219.0 lb

## 2018-01-05 DIAGNOSIS — E8881 Metabolic syndrome: Secondary | ICD-10-CM

## 2018-01-05 DIAGNOSIS — Z6834 Body mass index (BMI) 34.0-34.9, adult: Secondary | ICD-10-CM | POA: Diagnosis not present

## 2018-01-05 DIAGNOSIS — E88819 Insulin resistance, unspecified: Secondary | ICD-10-CM

## 2018-01-05 DIAGNOSIS — Z9189 Other specified personal risk factors, not elsewhere classified: Secondary | ICD-10-CM | POA: Diagnosis not present

## 2018-01-05 DIAGNOSIS — E669 Obesity, unspecified: Secondary | ICD-10-CM | POA: Diagnosis not present

## 2018-01-05 NOTE — Progress Notes (Signed)
Office: [phone]  /  Fax: [phone]   HPI:   Chief Complaint: OBESITY Raven Walters is here to discuss her progress with her obesity treatment plan. She is on the Category 3 plan and is following her eating plan approximately 85 % of the time. She states she is walking 30 minutes 3 to 4 times per week. Raven Walters  Her weight is 219 lb (99.3 kg) today and has had a weight loss of 2 pounds over a period of 2 weeks since her last visit. She has lost 1 lb since starting treatment with [place].  Insulin Resistance Raven Walters has a diagnosis of insulin resistance based on her elevated fasting insulin level >5. Although Raven Walters's blood glucose readings are still under good control, insulin resistance puts her at greater risk of metabolic syndrome and diabetes. She started taking metformin and had mild GI upset, but this has resolved. She is doing well with weight loss. Raven Walters continues to work on diet and exercise to decrease risk of diabetes.  At risk for diabetes Raven Walters is at higher than average risk for developing diabetes due to her obesity and insulin resistance. She currently denies polyuria or polydipsia.  ALLERGIES: No Known Allergies  MEDICATIONS: Current Outpatient Medications on File Prior to Visit  Medication Sig Dispense Refill  . cephALEXin (KEFLEX) 500 MG capsule Take 1 capsule (500 mg total) by mouth 2 (two) times daily. 14 capsule 0  . escitalopram (LEXAPRO) 10 MG tablet TAKE 1/2 TABLET FOR 3 DAYS THEN INCREASE TO ONE TABLET BY MOUTH DAILY 30 tablet 3  . labetalol (NORMODYNE) 100 MG tablet Take 1 tablet (100 mg total) by mouth 2 (two) times daily. 60 tablet 2  . metFORMIN (GLUCOPHAGE) 500 MG tablet Take 1 tablet (500 mg total) by mouth daily with breakfast. 30 tablet 0  . Vitamin D, Ergocalciferol, (DRISDOL) 50000 units CAPS capsule Take 1 capsule (50,000 Units total) by mouth every 7 (seven) days. 4 capsule 0   No current facility-administered medications on file prior to visit.      PAST MEDICAL HISTORY: Past Medical History:  Diagnosis Date  . Anxiety   . Asthma    pt states it is induced by excercise  . Chest pain 02/07/2017  . Dyspnea   . Essential hypertension 02/07/2017  . Headache   . Hx MRSA infection   . Infection    UTI  . Palpitations 02/07/2017  . Preeclampsia   . PVC (premature ventricular contraction) 03/18/2017    PAST SURGICAL HISTORY: Past Surgical History:  Procedure Laterality Date  . NO PAST SURGERIES      SOCIAL HISTORY: Social History   Tobacco Use  . Smoking status: Never Smoker  . Smokeless tobacco: Never Used  Substance Use Topics  . Alcohol use: Yes    Comment: social  . Drug use: No    FAMILY HISTORY: Family History  Problem Relation Age of Onset  . Hypertension Mother   . Cardiomyopathy Mother   . Heart disease Mother   . Anxiety disorder Mother   . Obesity Mother   . Hypertension Father   . Heart disease Father   . Hyperlipidemia Father   . Miscarriages / [place] Son   . Hypertension Brother   . Diabetes Maternal Grandmother   . Heart disease Maternal Grandfather   . Hypertension Maternal Grandfather   . Stroke Maternal Grandfather   . Heart disease Paternal Grandmother   . Hypertension Paternal Grandmother   . Stroke Paternal Grandmother   . Alzheimer's disease  Paternal Grandmother   . Hearing loss Neg Hx     ROS: Review of Systems  Constitutional: Positive for weight loss.  Gastrointestinal: Negative for diarrhea, nausea and vomiting.  Genitourinary: Negative for frequency.  Endo/Heme/Allergies: Negative for polydipsia.    PHYSICAL EXAM: Blood pressure 116/82, pulse 71, temperature 97.9 F (36.6 C), temperature source Oral, height 5\' 7"  (1.702 m), weight 219 lb (99.3 kg), last menstrual period 05/23/2017, SpO2 99 %, unknown if currently breastfeeding. Body mass index is 34.3 kg/m. Physical Exam  Constitutional: She is oriented to person, place, and time. She appears well-developed and  well-nourished.  Cardiovascular: Normal rate.  Pulmonary/Chest: Effort normal.  Musculoskeletal: Normal range of motion.  Neurological: She is oriented to person, place, and time.  Skin: Skin is warm and dry.  Psychiatric: She has a normal mood and affect. Her behavior is normal.  Vitals reviewed.   RECENT LABS AND TESTS: BMET    Component Value Date/Time   NA 137 12/07/2017 1125   K 4.2 12/07/2017 1125   CL 102 12/07/2017 1125   CO2 21 12/07/2017 1125   GLUCOSE 82 12/07/2017 1125   GLUCOSE 86 09/20/2015 1839   BUN 10 12/07/2017 1125   CREATININE 0.81 12/07/2017 1125   CALCIUM 9.0 12/07/2017 1125   GFRNONAA 96 12/07/2017 1125   GFRAA 111 12/07/2017 1125   Lab Results  Component Value Date   HGBA1C 5.4 12/07/2017   Lab Results  Component Value Date   INSULIN 13.7 12/07/2017   CBC    Component Value Date/Time   WBC 9.4 12/07/2017 1125   WBC 16.0 (H) 11/24/2015 0520   RBC 4.31 12/07/2017 1125   RBC 3.63 (L) 11/24/2015 0520   HGB 12.9 12/07/2017 1125   HCT 37.7 12/07/2017 1125   PLT 251 01/13/2017 1604   MCV 88 12/07/2017 1125   MCH 29.9 12/07/2017 1125   MCH 29.8 11/24/2015 0520   MCHC 34.2 12/07/2017 1125   MCHC 33.1 11/24/2015 0520   RDW 14.3 12/07/2017 1125   LYMPHSABS 3.0 12/07/2017 1125   EOSABS 0.2 12/07/2017 1125   BASOSABS 0.0 12/07/2017 1125   Iron/TIBC/Ferritin/ %Sat No results found for: IRON, TIBC, FERRITIN, IRONPCTSAT Lipid Panel     Component Value Date/Time   CHOL 164 12/07/2017 1125   TRIG 89 12/07/2017 1125   HDL 43 12/07/2017 1125   CHOLHDL 3.4 01/13/2017 1604   LDLCALC 103 (H) 12/07/2017 1125   Hepatic Function Panel     Component Value Date/Time   PROT 7.8 12/07/2017 1125   ALBUMIN 4.2 12/07/2017 1125   AST 15 12/07/2017 1125   ALT 15 12/07/2017 1125   ALKPHOS 61 12/07/2017 1125   BILITOT 0.5 12/07/2017 1125      Component Value Date/Time   TSH 0.804 12/07/2017 1125   TSH 1.090 01/13/2017 1604    ASSESSMENT AND  PLAN: Insulin resistance  At risk for diabetes mellitus  Class 1 obesity with serious comorbidity and body mass index (BMI) of 34.0 to 34.9 in adult, unspecified obesity type  PLAN:  Insulin Resistance Raven Walters will continue to work on weight loss, exercise, and decreasing simple carbohydrates in her diet to help decrease the risk of diabetes. We dicussed metformin including benefits and risks. She was informed that eating too many simple carbohydrates or too many calories at one sitting increases the likelihood of GI side effects. Raven Walters agrees to continue metformin and will follow up with us as directed to monitor her progress.  Diabetes risk counseling Raven Walters was  given extended (15 minutes) diabetes prevention counseling today. She is 32 y.o. female and has risk factors for diabetes including obesity and insulin resistance. We discussed intensive lifestyle modifications today with an emphasis on weight loss as well as increasing exercise and decreasing simple carbohydrates in her diet.  Obesity Raven Walters is currently in the action stage of change. As such, her goal is to continue with weight loss efforts She has agreed to follow the Category 3 plan Raven Walters has been instructed to work up to a goal of 150 minutes of combined cardio and strengthening exercise per week for weight loss and overall health benefits. We discussed the following Behavioral Modification Strategies today: increasing lean protein intake, decreasing simple carbohydrates , work on meal planning and easy cooking plans and dealing with family or coworker sabotage  Raven Walters has agreed to follow up with our clinic in 2 weeks. She was informed of the importance of frequent follow up visits to maximize her success with intensive lifestyle modifications for her multiple health conditions.   OBESITY BEHAVIORAL INTERVENTION VISIT  Today's visit was # 3 out of 22.  Starting weight: 220 lbs Starting date: 12/07/17 Today's weight :  219 lbs Today's date: 01/05/2018 Total lbs lost to date: 1 (Patients must lose 7 lbs in the first 6 months to continue with counseling)   ASK: We discussed the diagnosis of obesity with Raven Walters today and Raven Walters agreed to give [place] permission to discuss obesity behavioral modification therapy today.  ASSESS: Raven Walters has the diagnosis of obesity and her BMI today is 34.29 Raven Walters is in the action stage of change   ADVISE: Raven Walters was educated on the multiple health risks of obesity as well as the benefit of weight loss to improve her health. She was advised of the need for long term treatment and the importance of lifestyle modifications.  AGREE: Multiple dietary modification options and treatment options were discussed and  Raven Walters agreed to the above obesity treatment plan.  I, [place] Crane, am acting as transcriptionist for Quillian Quince, MD  I have reviewed the above documentation for accuracy and completeness, and I agree with the above. -Quillian Quince, MD

## 2018-01-06 ENCOUNTER — Telehealth: Payer: Self-pay | Admitting: Family Medicine

## 2018-01-06 NOTE — Telephone Encounter (Signed)
Copied from CRM [id number]. Topic: Quick Communication - Rx Refill/Question >> Jan 06, 2018  4:10 PM Elleana Stillson, Marcos EkeSharamare E, NT wrote: Medication: escitalopram (LEXAPRO) 10 MG tablet and  labetalol (NORMODYNE) 100 MG tablet  Has the patient contacted their pharmacy? Yes    (Agent: If no, request that the patient contact the pharmacy for the refill.)   Preferred Pharmacy (with phone number or street name): [id number], [place] - [address] [phone] (Phone) [phone] (Fax)     Agent: Please be advised that RX refills may take up to 3 business days. We ask that you follow-up with your pharmacy.

## 2018-01-06 NOTE — Telephone Encounter (Signed)
Patient stated that she really needs her blood pressure medication.  She has not taken it for a week. Please send to her preferred pharmacy ASAP.  Thank you.

## 2018-01-07 ENCOUNTER — Other Ambulatory Visit: Payer: Self-pay

## 2018-01-07 DIAGNOSIS — I10 Essential (primary) hypertension: Secondary | ICD-10-CM

## 2018-01-07 DIAGNOSIS — Z3A01 Less than 8 weeks gestation of pregnancy: Secondary | ICD-10-CM

## 2018-01-07 MED ORDER — LABETALOL HCL 100 MG PO TABS: 100.0000 mg | ORAL_TABLET | Freq: Two times a day (BID) | ORAL | 0 refills | Status: DC

## 2018-01-07 MED ORDER — ESCITALOPRAM OXALATE 10 MG PO TABS: ORAL_TABLET | ORAL | 3 refills | Status: DC

## 2018-01-13 ENCOUNTER — Other Ambulatory Visit (INDEPENDENT_AMBULATORY_CARE_PROVIDER_SITE_OTHER): Payer: Self-pay | Admitting: Family Medicine

## 2018-01-13 DIAGNOSIS — E559 Vitamin D deficiency, unspecified: Secondary | ICD-10-CM

## 2018-01-15 ENCOUNTER — Other Ambulatory Visit (INDEPENDENT_AMBULATORY_CARE_PROVIDER_SITE_OTHER): Payer: Self-pay | Admitting: Family Medicine

## 2018-01-15 DIAGNOSIS — E559 Vitamin D deficiency, unspecified: Secondary | ICD-10-CM

## 2018-01-17 ENCOUNTER — Other Ambulatory Visit (INDEPENDENT_AMBULATORY_CARE_PROVIDER_SITE_OTHER): Payer: Self-pay | Admitting: Family Medicine

## 2018-01-17 DIAGNOSIS — E8881 Metabolic syndrome: Secondary | ICD-10-CM

## 2018-01-17 DIAGNOSIS — E88819 Insulin resistance, unspecified: Secondary | ICD-10-CM

## 2018-01-24 ENCOUNTER — Ambulatory Visit (INDEPENDENT_AMBULATORY_CARE_PROVIDER_SITE_OTHER): Payer: 59 | Admitting: Family Medicine

## 2018-01-24 VITALS — BP 143/90 | HR 68 | Temp 98.0°F | Ht 67.0 in | Wt 218.0 lb

## 2018-01-24 DIAGNOSIS — Z9189 Other specified personal risk factors, not elsewhere classified: Secondary | ICD-10-CM | POA: Diagnosis not present

## 2018-01-24 DIAGNOSIS — Z6834 Body mass index (BMI) 34.0-34.9, adult: Secondary | ICD-10-CM | POA: Diagnosis not present

## 2018-01-24 DIAGNOSIS — I10 Essential (primary) hypertension: Secondary | ICD-10-CM | POA: Diagnosis not present

## 2018-01-24 DIAGNOSIS — E559 Vitamin D deficiency, unspecified: Secondary | ICD-10-CM

## 2018-01-24 DIAGNOSIS — E8881 Metabolic syndrome: Secondary | ICD-10-CM | POA: Diagnosis not present

## 2018-01-24 DIAGNOSIS — E669 Obesity, unspecified: Secondary | ICD-10-CM | POA: Diagnosis not present

## 2018-01-24 MED ORDER — METFORMIN HCL 500 MG PO TABS: 500.0000 mg | ORAL_TABLET | Freq: Every day | ORAL | 0 refills | Status: DC

## 2018-01-24 MED ORDER — VITAMIN D (ERGOCALCIFEROL) 1.25 MG (50000 UNIT) PO CAPS: 50000.0000 [IU] | ORAL_CAPSULE | ORAL | 0 refills | Status: DC

## 2018-01-24 MED ORDER — LABETALOL HCL 100 MG PO TABS: 100.0000 mg | ORAL_TABLET | Freq: Two times a day (BID) | ORAL | 0 refills | Status: DC

## 2018-01-24 NOTE — Progress Notes (Signed)
Office: [phone]  /  Fax: [phone]   HPI:   Chief Complaint: OBESITY Elyce is here to discuss her progress with her obesity treatment plan. She is on the Category 3 plan and is following her eating plan approximately 85 % of the time. She states she is walking for 25 minutes 3 times per week. Valeda continues to lose weight but has had increase temptations and sabotage from husband and feels she has increased emotional eating. she is struggling with meal planning. She has some traveling and celebrations coming up and worried about weight gain.  Her weight is 218 lb (98.9 kg) today and has had a weight loss of 1 pound over a period of 2 to 3 weeks weeks since her last visit. She has lost 2 lbs since starting treatment with [place].  Hypertension SIA GABRIELSEN is a 32 y.o. female with hypertension. Esteen's blood pressure is elevated today, she notes increase stress at work and thinks this is the cause of her elevated blood pressure. She denies chest pain or headache. She is working weight loss to help control her blood pressure with the goal of decreasing her risk of heart attack and stroke. Adison's blood pressure is not currently controlled.  At risk for cardiovascular disease Tiwatope is at a higher than average risk for cardiovascular disease due to obesity. She currently denies any chest pain.  Vitamin D Deficiency Kriste has a diagnosis of vitamin D deficiency. She is on Vit D prescription, not yet at goal. She denies nausea, vomiting or muscle weakness.  Insulin Resistance Calene has a diagnosis of insulin resistance based on her elevated fasting insulin level >5. Although Naziah's blood glucose readings are still under good control, insulin resistance puts her at greater risk of metabolic syndrome and diabetes. She is stable on metformin and continues to work on diet and exercise to decrease risk of diabetes.  ALLERGIES: No Known Allergies  MEDICATIONS: Current Outpatient  Medications on File Prior to Visit  Medication Sig Dispense Refill  . cephALEXin (KEFLEX) 500 MG capsule Take 1 capsule (500 mg total) by mouth 2 (two) times daily. 14 capsule 0  . escitalopram (LEXAPRO) 10 MG tablet TAKE 1/2 TABLET FOR 3 DAYS THEN INCREASE TO ONE TABLET BY MOUTH DAILY 30 tablet 3   No current facility-administered medications on file prior to visit.     PAST MEDICAL HISTORY: Past Medical History:  Diagnosis Date  . Anxiety   . Asthma    pt states it is induced by excercise  . Chest pain 02/07/2017  . Dyspnea   . Essential hypertension 02/07/2017  . Headache   . Hx MRSA infection   . Infection    UTI  . Palpitations 02/07/2017  . Preeclampsia   . PVC (premature ventricular contraction) 03/18/2017    PAST SURGICAL HISTORY: Past Surgical History:  Procedure Laterality Date  . NO PAST SURGERIES      SOCIAL HISTORY: Social History   Tobacco Use  . Smoking status: Never Smoker  . Smokeless tobacco: Never Used  Substance Use Topics  . Alcohol use: Yes    Comment: social  . Drug use: No    FAMILY HISTORY: Family History  Problem Relation Age of Onset  . Hypertension Mother   . Cardiomyopathy Mother   . Heart disease Mother   . Anxiety disorder Mother   . Obesity Mother   . Hypertension Father   . Heart disease Father   . Hyperlipidemia Father   . Miscarriages /  Stillbirths Son   . Hypertension Brother   . Diabetes Maternal Grandmother   . Heart disease Maternal Grandfather   . Hypertension Maternal Grandfather   . Stroke Maternal Grandfather   . Heart disease Paternal Grandmother   . Hypertension Paternal Grandmother   . Stroke Paternal Grandmother   . Alzheimer's disease Paternal Grandmother   . Hearing loss Neg Hx     ROS: Review of Systems  Constitutional: Positive for weight loss.  Cardiovascular: Negative for chest pain.  Gastrointestinal: Negative for nausea and vomiting.  Musculoskeletal:       Negative muscle weakness    Neurological: Negative for headaches.    PHYSICAL EXAM: Blood pressure (!) 143/90, pulse 68, temperature 98 F (36.7 C), temperature source Oral, height 5\' 7"  (1.702 m), weight 218 lb (98.9 kg), last menstrual period 05/23/2017, SpO2 99 %, unknown if currently breastfeeding. Body mass index is 34.14 kg/m. Physical Exam  Constitutional: She is oriented to person, place, and time. She appears well-developed and well-nourished.  Cardiovascular: Normal rate.  Pulmonary/Chest: Effort normal.  Musculoskeletal: Normal range of motion.  Neurological: She is oriented to person, place, and time.  Skin: Skin is warm and dry.  Psychiatric: She has a normal mood and affect. Her behavior is normal.  Vitals reviewed.   RECENT LABS AND TESTS: BMET    Component Value Date/Time   NA 137 12/07/2017 1125   K 4.2 12/07/2017 1125   CL 102 12/07/2017 1125   CO2 21 12/07/2017 1125   GLUCOSE 82 12/07/2017 1125   GLUCOSE 86 09/20/2015 1839   BUN 10 12/07/2017 1125   CREATININE 0.81 12/07/2017 1125   CALCIUM 9.0 12/07/2017 1125   GFRNONAA 96 12/07/2017 1125   GFRAA 111 12/07/2017 1125   Lab Results  Component Value Date   HGBA1C 5.4 12/07/2017   Lab Results  Component Value Date   INSULIN 13.7 12/07/2017   CBC    Component Value Date/Time   WBC 9.4 12/07/2017 1125   WBC 16.0 (H) 11/24/2015 0520   RBC 4.31 12/07/2017 1125   RBC 3.63 (L) 11/24/2015 0520   HGB 12.9 12/07/2017 1125   HCT 37.7 12/07/2017 1125   PLT 251 01/13/2017 1604   MCV 88 12/07/2017 1125   MCH 29.9 12/07/2017 1125   MCH 29.8 11/24/2015 0520   MCHC 34.2 12/07/2017 1125   MCHC 33.1 11/24/2015 0520   RDW 14.3 12/07/2017 1125   LYMPHSABS 3.0 12/07/2017 1125   EOSABS 0.2 12/07/2017 1125   BASOSABS 0.0 12/07/2017 1125   Iron/TIBC/Ferritin/ %Sat No results found for: IRON, TIBC, FERRITIN, IRONPCTSAT Lipid Panel     Component Value Date/Time   CHOL 164 12/07/2017 1125   TRIG 89 12/07/2017 1125   HDL 43 12/07/2017  1125   CHOLHDL 3.4 01/13/2017 1604   LDLCALC 103 (H) 12/07/2017 1125   Hepatic Function Panel     Component Value Date/Time   PROT 7.8 12/07/2017 1125   ALBUMIN 4.2 12/07/2017 1125   AST 15 12/07/2017 1125   ALT 15 12/07/2017 1125   ALKPHOS 61 12/07/2017 1125   BILITOT 0.5 12/07/2017 1125      Component Value Date/Time   TSH 0.804 12/07/2017 1125   TSH 1.090 01/13/2017 1604  Results for Fendrick, Talissa N "Lether Lukach" (MRN [id number]) as of 01/24/2018 17:48  Ref. Range 12/07/2017 11:25  Vitamin D, 25-Hydroxy Latest Ref Range: 30.0 - 100.0 ng/mL 15.6 (L)    ASSESSMENT AND PLAN: Essential hypertension - Plan: labetalol (NORMODYNE) 100 MG tablet  Vitamin D deficiency - Plan: Vitamin D, Ergocalciferol, (DRISDOL) 50000 units CAPS capsule  Insulin resistance - Plan: metFORMIN (GLUCOPHAGE) 500 MG tablet  At risk for heart disease  Class 1 obesity with serious comorbidity and body mass index (BMI) of 34.0 to 34.9 in adult, unspecified obesity type  PLAN:  Hypertension We discussed sodium restriction, working on healthy weight loss, and a regular exercise program as the means to achieve improved blood pressure control. Jase agreed with this plan and agreed to follow up as directed. We will continue to monitor her blood pressure as well as her progress with the above lifestyle modifications. Lyndzee agrees to continue labetalol 100 mg BID #60 and we will refill for 1 month and she will watch for signs of hypotension as she continues her lifestyle modifications. She will continue diet and exercise and Shareen agrees to follow up with our clinic in 2 to 3 weeks and we will recheck blood pressure at that time.  Cardiovascular risk counselling Latona was given extended (15 minutes) coronary artery disease prevention counseling today. She is 32 y.o. female and has risk factors for heart disease including obesity and hypertension. We discussed intensive lifestyle modifications today with an  emphasis on specific weight loss instructions and strategies. Pt was also informed of the importance of increasing exercise and decreasing saturated fats to help prevent heart disease.  Vitamin D Deficiency Itzy was informed that low vitamin D levels contributes to fatigue and are associated with obesity, breast, and colon cancer. Vennessa agrees to continue taking prescription Vit D @50 ,000 IU every week #4 and we will refill for 1 month. She will follow up for routine testing of vitamin D, at least 2-3 times per year. She was informed of the risk of over-replacement of vitamin D and agrees to not increase her dose unless she discusses this with [place] first. Natausha agrees to follow up with our clinic in 2 to 3 weeks.  Insulin Resistance Hendel will continue to work on weight loss, exercise, and decreasing simple carbohydrates in her diet to help decrease the risk of diabetes. We dicussed metformin including benefits and risks. She was informed that eating too many simple carbohydrates or too many calories at one sitting increases the likelihood of GI side effects. Erine agrees to continue taking metformin 500 mg q AM #30 and we will refill for 1 month. Geni agrees to follow up with our clinic in 2 to 3 weeks as directed to monitor her progress.  Obesity Rosa is currently in the action stage of change. As such, her goal is to continue with weight loss efforts She has agreed to follow the Category 3 plan Cassidey has been instructed to work up to a goal of 150 minutes of combined cardio and strengthening exercise per week for weight loss and overall health benefits. We discussed the following Behavioral Modification Strategies today: increasing lean protein intake, increasing vegetables, dealing with family or coworker sabotage, and travel eating strategies   Celenia has agreed to follow up with our clinic in 2 to 3 weeks. She was informed of the importance of frequent follow up visits to maximize her  success with intensive lifestyle modifications for her multiple health conditions.   OBESITY BEHAVIORAL INTERVENTION VISIT  Today's visit was # 4 out of 22.  Starting weight: 220 lbs Starting date: 12/07/17 Today's weight : 218 lbs Today's date: 01/24/2018 Total lbs lost to date: 2 (Patients must lose 7 lbs in the first 6 months to continue with counseling)  ASK: We discussed the diagnosis of obesity with Cristino MartesAlexis N Scheibel today and [name] agreed to give us permission to discuss obesity behavioral modification therapy today.  ASSESS: [name] has the diagnosis of obesity and her BMI today is 34.14 [name] is in the action stage of change   ADVISE: [name] was educated on the multiple health risks of obesity as well as the benefit of weight loss to improve her health. She was advised of the need for long term treatment and the importance of lifestyle modifications.  AGREE: Multiple dietary modification options and treatment options were discussed and  Kymia agreed to the above obesity treatment plan.  I, Burt KnackSharon Martin, am acting as transcriptionist for Quillian Quincearen Brandee Markin, MD  I have reviewed the above documentation for accuracy and completeness, and I agree with the above. -Quillian Quincearen Jakyah Bradby, MD

## 2018-02-09 ENCOUNTER — Ambulatory Visit (INDEPENDENT_AMBULATORY_CARE_PROVIDER_SITE_OTHER): Payer: 59 | Admitting: Physician Assistant

## 2018-02-09 ENCOUNTER — Encounter (INDEPENDENT_AMBULATORY_CARE_PROVIDER_SITE_OTHER): Payer: Self-pay | Admitting: Physician Assistant

## 2018-02-09 VITALS — BP 124/84 | HR 73 | Temp 97.6°F | Ht 67.0 in | Wt 217.0 lb

## 2018-02-09 DIAGNOSIS — Z6834 Body mass index (BMI) 34.0-34.9, adult: Secondary | ICD-10-CM

## 2018-02-09 DIAGNOSIS — E669 Obesity, unspecified: Secondary | ICD-10-CM

## 2018-02-09 DIAGNOSIS — E559 Vitamin D deficiency, unspecified: Secondary | ICD-10-CM | POA: Diagnosis not present

## 2018-02-09 DIAGNOSIS — I10 Essential (primary) hypertension: Secondary | ICD-10-CM | POA: Diagnosis not present

## 2018-02-09 DIAGNOSIS — F3289 Other specified depressive episodes: Secondary | ICD-10-CM

## 2018-02-09 DIAGNOSIS — Z9189 Other specified personal risk factors, not elsewhere classified: Secondary | ICD-10-CM

## 2018-02-09 DIAGNOSIS — E8881 Metabolic syndrome: Secondary | ICD-10-CM | POA: Diagnosis not present

## 2018-02-09 MED ORDER — BUPROPION HCL ER (SR) 150 MG PO TB12: 150.0000 mg | ORAL_TABLET | Freq: Every day | ORAL | 0 refills | Status: DC

## 2018-02-09 MED ORDER — LABETALOL HCL 100 MG PO TABS: 100.0000 mg | ORAL_TABLET | Freq: Two times a day (BID) | ORAL | 0 refills | Status: DC

## 2018-02-09 MED ORDER — METFORMIN HCL 500 MG PO TABS: 500.0000 mg | ORAL_TABLET | Freq: Two times a day (BID) | ORAL | 0 refills | Status: DC

## 2018-02-09 MED ORDER — VITAMIN D (ERGOCALCIFEROL) 1.25 MG (50000 UNIT) PO CAPS: 50000.0000 [IU] | ORAL_CAPSULE | ORAL | 0 refills | Status: DC

## 2018-02-10 NOTE — Progress Notes (Signed)
Office: [phone]  /  Fax: [phone]   HPI:   Chief Complaint: OBESITY Raven Walters is here to discuss her progress with her obesity treatment plan. She is on the Category 3 plan and is following her eating plan approximately 85 % of the time. She states she is walking for 30 minutes 3 times per week. Raven Walters continues to do well with weight loss. She admits to hunger in the evening time and has had increase in cravings.  Her weight is 217 lb (98.4 kg) today and has had a weight loss of 1 pound over a period of 2 weeks since her last visit. She has lost 3 lbs since starting treatment with us.  Vitamin D Deficiency Raven Walters has a diagnosis of vitamin D deficiency. She is currently taking prescription Vit D and denies nausea, vomiting or muscle weakness.  Hypertension Raven Walters is a 32 y.o. female with hypertension. Raven Walters's blood pressure is stable. She denies chest pain or shortness of breath. She requests a refill of labetalol , as it would be hard to schedule an appointment with her primary care physician, and she is out of her medication. Her heart rate is at 73. She is working weight loss to help control her blood pressure with the goal of decreasing her risk of heart attack and stroke. [name]'s blood pressure is currently controlled.  At risk for cardiovascular disease Raven Walters is at a higher than average risk for cardiovascular disease due to obesity and hypertension. She currently denies any chest pain.  Insulin Resistance Raven Walters has a diagnosis of insulin resistance based on her elevated fasting insulin level >5. Although Raven Walters's blood glucose readings are still under good control, insulin resistance puts her at greater risk of metabolic syndrome and diabetes. She is taking metformin currently and continues to work on diet and exercise to decrease risk of diabetes. She notes polyphagia.  Depression with emotional eating behaviors Raven Walters's mood is stable and she is also on Lexapro  10 mg. Raven Walters struggles with emotional eating and using food for comfort to the extent that it is negatively impacting her health. She often snacks when she is not hungry. Raven Walters sometimes feels she is out of control and then feels guilty that she made poor food choices. She has been working on behavior modification techniques to help reduce her emotional eating and has been somewhat successful. She shows no sign of suicidal or homicidal ideations.  [facility] 12/07/2017 06/25/2017 06/18/2017 01/13/2017  Decreased Interest 3 0 0 0  Down, Depressed, Hopeless 1 0 0 0  PHQ - 2 Score 4 0 0 0  Altered sleeping 3 - - -  Tired, decreased energy 3 - - -  Change in appetite 3 - - -  Feeling bad or failure about yourself  2 - - -  Trouble concentrating 3 - - -  Moving slowly or fidgety/restless 1 - - -  Suicidal thoughts 0 - - -  PHQ-9 Score 19 - - -  Difficult doing work/chores Not difficult at all - - -   ALLERGIES: No Known Allergies  MEDICATIONS: Current Outpatient Medications on File Prior to Visit  Medication Sig Dispense Refill  . cephALEXin (KEFLEX) 500 MG capsule Take 1 capsule (500 mg total) by mouth 2 (two) times daily. 14 capsule 0  . escitalopram (LEXAPRO) 10 MG tablet TAKE 1/2 TABLET FOR 3 DAYS THEN INCREASE TO ONE TABLET BY MOUTH DAILY 30 tablet 3   No current facility-administered medications on file prior  to visit.     PAST MEDICAL HISTORY: Past Medical History:  Diagnosis Date  . Anxiety   . Asthma    pt states it is induced by excercise  . Chest pain 02/07/2017  . Dyspnea   . Essential hypertension 02/07/2017  . Headache   . Hx MRSA infection   . Infection    UTI  . Palpitations 02/07/2017  . Preeclampsia   . PVC (premature ventricular contraction) 03/18/2017    PAST SURGICAL HISTORY: Past Surgical History:  Procedure Laterality Date  . NO PAST SURGERIES      SOCIAL HISTORY: Social History   Tobacco Use  . Smoking status: Never Smoker  . Smokeless  tobacco: Never Used  Substance Use Topics  . Alcohol use: Yes    Comment: social  . Drug use: No    FAMILY HISTORY: Family History  Problem Relation Age of Onset  . Hypertension Mother   . Cardiomyopathy Mother   . Heart disease Mother   . Anxiety disorder Mother   . Obesity Mother   . Hypertension Father   . Heart disease Father   . Hyperlipidemia Father   . Miscarriages / [place] Son   . Hypertension Brother   . Diabetes Maternal Grandmother   . Heart disease Maternal Grandfather   . Hypertension Maternal Grandfather   . Stroke Maternal Grandfather   . Heart disease Paternal Grandmother   . Hypertension Paternal Grandmother   . Stroke Paternal Grandmother   . Alzheimer's disease Paternal Grandmother   . Hearing loss Neg Hx     ROS: Review of Systems  Constitutional: Positive for weight loss.  Respiratory: Negative for shortness of breath.   Cardiovascular: Negative for chest pain.  Gastrointestinal: Negative for nausea and vomiting.  Musculoskeletal:       Negative muscle weakness  Endo/Heme/Allergies:       Positive polyphagia  Psychiatric/Behavioral: Positive for depression. Negative for suicidal ideas.    PHYSICAL EXAM: Blood pressure 124/84, pulse 73, temperature 97.6 F (36.4 C), temperature source Oral, height 5\' 7"  (1.702 m), weight 217 lb (98.4 kg), last menstrual period 05/23/2017, SpO2 100 %, unknown if currently breastfeeding. Body mass index is 33.99 kg/m. Physical Exam  Constitutional: She is oriented to person, place, and time. She appears well-developed and well-nourished.  Cardiovascular: Normal rate.  Pulmonary/Chest: Effort normal.  Musculoskeletal: Normal range of motion.  Neurological: She is oriented to person, place, and time.  Skin: Skin is warm and dry.  Psychiatric: She has a normal mood and affect. Her behavior is normal.  Vitals reviewed.   RECENT LABS AND TESTS: BMET    Component Value Date/Time   NA 137 12/07/2017 1125    K 4.2 12/07/2017 1125   CL 102 12/07/2017 1125   CO2 21 12/07/2017 1125   GLUCOSE 82 12/07/2017 1125   GLUCOSE 86 09/20/2015 1839   BUN 10 12/07/2017 1125   CREATININE 0.81 12/07/2017 1125   CALCIUM 9.0 12/07/2017 1125   GFRNONAA 96 12/07/2017 1125   GFRAA 111 12/07/2017 1125   Lab Results  Component Value Date   HGBA1C 5.4 12/07/2017   Lab Results  Component Value Date   INSULIN 13.7 12/07/2017   CBC    Component Value Date/Time   WBC 9.4 12/07/2017 1125   WBC 16.0 (H) 11/24/2015 0520   RBC 4.31 12/07/2017 1125   RBC 3.63 (L) 11/24/2015 0520   HGB 12.9 12/07/2017 1125   HCT 37.7 12/07/2017 1125   PLT 251 01/13/2017 1604  MCV 88 12/07/2017 1125   MCH 29.9 12/07/2017 1125   MCH 29.8 11/24/2015 0520   MCHC 34.2 12/07/2017 1125   MCHC 33.1 11/24/2015 0520   RDW 14.3 12/07/2017 1125   LYMPHSABS 3.0 12/07/2017 1125   EOSABS 0.2 12/07/2017 1125   BASOSABS 0.0 12/07/2017 1125   Iron/TIBC/Ferritin/ %Sat No results found for: IRON, TIBC, FERRITIN, IRONPCTSAT Lipid Panel     Component Value Date/Time   CHOL 164 12/07/2017 1125   TRIG 89 12/07/2017 1125   HDL 43 12/07/2017 1125   CHOLHDL 3.4 01/13/2017 1604   LDLCALC 103 (H) 12/07/2017 1125   Hepatic Function Panel     Component Value Date/Time   PROT 7.8 12/07/2017 1125   ALBUMIN 4.2 12/07/2017 1125   AST 15 12/07/2017 1125   ALT 15 12/07/2017 1125   ALKPHOS 61 12/07/2017 1125   BILITOT 0.5 12/07/2017 1125      Component Value Date/Time   TSH 0.804 12/07/2017 1125   TSH 1.090 01/13/2017 1604  Results for Golinski, Letrice N "BOBBYJO MARULANDA" (MRN [id number]) as of 02/10/2018 08:29  Ref. Range 12/07/2017 11:25  Vitamin D, 25-Hydroxy Latest Ref Range: 30.0 - 100.0 ng/mL 15.6 (L)    ASSESSMENT AND PLAN: Vitamin D deficiency - Plan: Vitamin D, Ergocalciferol, (DRISDOL) 50000 units CAPS capsule  Essential hypertension - Plan: labetalol (NORMODYNE) 100 MG tablet  Insulin resistance - Plan: metFORMIN (GLUCOPHAGE) 500  MG tablet  Other depression - with emotional eating  At risk for heart disease  Class 1 obesity with serious comorbidity and body mass index (BMI) of 34.0 to 34.9 in adult, unspecified obesity type  PLAN:  Vitamin D Deficiency Tabatha was informed that low vitamin D levels contributes to fatigue and are associated with obesity, breast, and colon cancer. Diasia agrees to continue taking prescription Vit D @50 ,000 IU every week #4 and we will refill for 1 month. She will follow up for routine testing of vitamin D, at least 2-3 times per year. She was informed of the risk of over-replacement of vitamin D and agrees to not increase her dose unless she discusses this with [place] first. Virgene agrees to follow up with our clinic in 2 weeks.  Hypertension We discussed sodium restriction, working on healthy weight loss, and a regular exercise program as the means to achieve improved blood pressure control. Keandrea agreed with this plan and agreed to follow up as directed. We will continue to monitor her blood pressure as well as her progress with the above lifestyle modifications. Millianna agrees to continue taking labetalol 100 mg BID #60 and we will refill for 1 month and she will watch for signs of hypotension as she continues her lifestyle modifications. Rainah agrees to follow up with our clinic in 2 weeks.  Cardiovascular risk counselling Kwana was given extended (15 minutes) coronary artery disease prevention counseling today. She is 32 y.o. female and has risk factors for heart disease including obesity and hypertension. We discussed intensive lifestyle modifications today with an emphasis on specific weight loss instructions and strategies. Pt was also informed of the importance of increasing exercise and decreasing saturated fats to help prevent heart disease.  Insulin Resistance Britzy will continue to work on weight loss, exercise, and decreasing simple carbohydrates in her diet to help decrease the  risk of diabetes. We dicussed metformin including benefits and risks. She was informed that eating too many simple carbohydrates or too many calories at one sitting increases the likelihood of GI side effects. Arihanna agrees to  increase metformin to 500 mg BID #60 and we will refill for 1 month. Kendalyn agrees to follow up with our clinic in 2 weeks as directed to monitor her progress.  Depression with Emotional Eating Behaviors We discussed behavior modification techniques today to help Taisia deal with her emotional eating and depression. Vernisha agrees to continue taking Lexapro and she agrees to start Wellbutrin SR 150 mg qd #30 with no refills. Sebrina agrees to follow up with our clinic in 2 weeks.  Obesity Clayton is currently in the action stage of change. As such, her goal is to continue with weight loss efforts She has agreed to follow the Category 3 plan Valeria has been instructed to work up to a goal of 150 minutes of combined cardio and strengthening exercise per week for weight loss and overall health benefits. We discussed the following Behavioral Modification Strategies today: increasing lean protein intake and emotional eating strategies   Danie has agreed to follow up with our clinic in 2 weeks. She was informed of the importance of frequent follow up visits to maximize her success with intensive lifestyle modifications for her multiple health conditions.   OBESITY BEHAVIORAL INTERVENTION VISIT  Today's visit was # 5 out of 22.  Starting weight: 220 lbs Starting date: 12/07/17 Today's weight : 217 lbs Today's date: 02/09/2018 Total lbs lost to date: 3 (Patients must lose 7 lbs in the first 6 months to continue with counseling)   ASK: We discussed the diagnosis of obesity with Raven Martes today and Shaylene agreed to give [place] permission to discuss obesity behavioral modification therapy today.  ASSESS: Odyssey has the diagnosis of obesity and her BMI today is 33.98 Neema is  in the action stage of change   ADVISE: Decie was educated on the multiple health risks of obesity as well as the benefit of weight loss to improve her health. She was advised of the need for long term treatment and the importance of lifestyle modifications.  AGREE: Multiple dietary modification options and treatment options were discussed and  British agreed to the above obesity treatment plan.   Trude Mcburney, am acting as transcriptionist for Illa Level, PA-C I, Illa Level [facility], have reviewed this note and agree with its content

## 2018-02-14 ENCOUNTER — Other Ambulatory Visit: Payer: Self-pay | Admitting: Family Medicine

## 2018-02-14 ENCOUNTER — Other Ambulatory Visit (INDEPENDENT_AMBULATORY_CARE_PROVIDER_SITE_OTHER): Payer: Self-pay | Admitting: Physician Assistant

## 2018-02-14 DIAGNOSIS — I10 Essential (primary) hypertension: Secondary | ICD-10-CM

## 2018-02-14 DIAGNOSIS — E88819 Insulin resistance, unspecified: Secondary | ICD-10-CM

## 2018-02-14 DIAGNOSIS — E8881 Metabolic syndrome: Secondary | ICD-10-CM

## 2018-02-14 NOTE — Telephone Encounter (Unsigned)
Copied from CRM [id number]. Topic: Quick Communication - Rx Refill/Question >> Feb 14, 2018  4:18 PM Raquel SarnaHayes, Raven G wrote: escitalopram (LEXAPRO) 10 MG tablet labetalol (NORMODYNE) 100 MG tablet  Pt needing new Rx's written for 90 day supply due to insurance will not pay unless it's a 90 day supply.  [facility] [address] [place], [place] [address] [phone]

## 2018-02-14 NOTE — Telephone Encounter (Signed)
LOV: 06/18/17  PCP: Dr. Sheppard CoilStallings  [facility]      [address] [place]

## 2018-02-15 NOTE — Telephone Encounter (Signed)
Patient called and informed she will need to schedule an office visit before medication can be refilled, she agreed, appointment scheduled for tomorrow at 0940 with Dr. Creta LevinStallings. I advised once she comes in for the visit to inform Dr. Creta LevinStallings she needs refills, she verbalized understanding.

## 2018-02-16 ENCOUNTER — Encounter: Payer: Self-pay | Admitting: Family Medicine

## 2018-02-16 ENCOUNTER — Other Ambulatory Visit: Payer: Self-pay

## 2018-02-16 ENCOUNTER — Ambulatory Visit (INDEPENDENT_AMBULATORY_CARE_PROVIDER_SITE_OTHER): Payer: 59 | Admitting: Family Medicine

## 2018-02-16 VITALS — BP 126/84 | HR 77 | Temp 98.9°F | Resp 18 | Ht 67.0 in | Wt 217.4 lb

## 2018-02-16 DIAGNOSIS — F411 Generalized anxiety disorder: Secondary | ICD-10-CM

## 2018-02-16 DIAGNOSIS — Z6834 Body mass index (BMI) 34.0-34.9, adult: Secondary | ICD-10-CM

## 2018-02-16 DIAGNOSIS — E669 Obesity, unspecified: Secondary | ICD-10-CM | POA: Diagnosis not present

## 2018-02-16 DIAGNOSIS — I10 Essential (primary) hypertension: Secondary | ICD-10-CM | POA: Diagnosis not present

## 2018-02-16 MED ORDER — ESCITALOPRAM OXALATE 10 MG PO TABS: ORAL_TABLET | ORAL | 3 refills | Status: DC

## 2018-02-16 MED ORDER — BUPROPION HCL ER (SR) 150 MG PO TB12: 150.0000 mg | ORAL_TABLET | Freq: Two times a day (BID) | ORAL | 3 refills | Status: DC

## 2018-02-16 MED ORDER — LABETALOL HCL 100 MG PO TABS: 100.0000 mg | ORAL_TABLET | Freq: Two times a day (BID) | ORAL | 1 refills | Status: DC

## 2018-02-16 NOTE — Patient Instructions (Addendum)
   IF you received an x-ray today, you will receive an invoice from [facility]. Please contact [facility] at [phone] with questions or concerns regarding your invoice.   IF you received labwork today, you will receive an invoice from [facility]. Please contact [facility] at [phone] with questions or concerns regarding your invoice.   Our billing staff will not be able to assist you with questions regarding bills from these companies.  You will be contacted with the lab results as soon as they are available. The fastest way to get your results is to activate your My Chart account. Instructions are located on the last page of this paperwork. If you have not heard from us regarding the results in 2 weeks, please contact this office.     Generalized Anxiety Disorder, Adult Generalized anxiety disorder (GAD) is a mental health disorder. People with this condition constantly worry about everyday events. Unlike normal anxiety, worry related to GAD is not triggered by a specific event. These worries also do not fade or get better with time. GAD interferes with life functions, including relationships, work, and school. GAD can vary from mild to severe. People with severe GAD can have intense waves of anxiety with physical symptoms (panic attacks). What are the causes? The exact cause of GAD is not known. What increases the risk? This condition is more likely to develop in:  Women.  People who have a family history of anxiety disorders.  People who are very shy.  People who experience very stressful life events, such as the death of a loved one.  People who have a very stressful family environment.  What are the signs or symptoms? People with GAD often worry excessively about many things in their lives, such as their health and family. They may also be overly concerned about:  Doing well at work.  Being on time.  Natural disasters.  Friendships.  Physical  symptoms of GAD include:  Fatigue.  Muscle tension or having muscle twitches.  Trembling or feeling shaky.  Being easily startled.  Feeling like your heart is pounding or racing.  Feeling out of breath or like you cannot take a deep breath.  Having trouble falling asleep or staying asleep.  Sweating.  Nausea, diarrhea, or irritable bowel syndrome (IBS).  Headaches.  Trouble concentrating or remembering facts.  Restlessness.  Irritability.  How is this diagnosed? Your health care provider can diagnose GAD based on your symptoms and medical history. You will also have a physical exam. The health care provider will ask specific questions about your symptoms, including how severe they are, when they started, and if they come and go. Your health care provider may ask you about your use of alcohol or drugs, including prescription medicines. Your health care provider may refer you to a mental health specialist for further evaluation. Your health care provider will do a thorough examination and may perform additional tests to rule out other possible causes of your symptoms. To be diagnosed with GAD, a person must have anxiety that:  Is out of his or her control.  Affects several different aspects of his or her life, such as work and relationships.  Causes distress that makes him or her unable to take part in normal activities.  Includes at least three physical symptoms of GAD, such as restlessness, fatigue, trouble concentrating, irritability, muscle tension, or sleep problems.  Before your health care provider can confirm a diagnosis of GAD, these symptoms must be present more days than   they are not, and they must last for six months or longer. How is this treated? The following therapies are usually used to treat GAD:  Medicine. Antidepressant medicine is usually prescribed for long-term daily control. Antianxiety medicines may be added in severe cases, especially when panic  attacks occur.  Talk therapy (psychotherapy). Certain types of talk therapy can be helpful in treating GAD by providing support, education, and guidance. Options include: ? Cognitive behavioral therapy (CBT). People learn coping skills and techniques to ease their anxiety. They learn to identify unrealistic or negative thoughts and behaviors and to replace them with positive ones. ? Acceptance and commitment therapy (ACT). This treatment teaches people how to be mindful as a way to cope with unwanted thoughts and feelings. ? Biofeedback. This process trains you to manage your body's response (physiological response) through breathing techniques and relaxation methods. You will work with a therapist while machines are used to monitor your physical symptoms.  Stress management techniques. These include yoga, meditation, and exercise.  A mental health specialist can help determine which treatment is best for you. Some people see improvement with one type of therapy. However, other people require a combination of therapies. Follow these instructions at home:  Take over-the-counter and prescription medicines only as told by your health care provider.  Try to maintain a normal routine.  Try to anticipate stressful situations and allow extra time to manage them.  Practice any stress management or self-calming techniques as taught by your health care provider.  Do not punish yourself for setbacks or for not making progress.  Try to recognize your accomplishments, even if they are small.  Keep all follow-up visits as told by your health care provider. This is important. Contact a health care provider if:  Your symptoms do not get better.  Your symptoms get worse.  You have signs of depression, such as: ? A persistently sad, cranky, or irritable mood. ? Loss of enjoyment in activities that used to bring you joy. ? Change in weight or eating. ? Changes in sleeping habits. ? Avoiding friends  or family members. ? Loss of energy for normal tasks. ? Feelings of guilt or worthlessness. Get help right away if:  You have serious thoughts about hurting yourself or others. If you ever feel like you may hurt yourself or others, or have thoughts about taking your own life, get help right away. You can go to your nearest emergency department or call:  Your local emergency services (911 in the [place]  A suicide crisis helpline, such as the National Suicide Prevention Lifeline at 1-800-273-8255. This is open 24 hours a day.  Summary  Generalized anxiety disorder (GAD) is a mental health disorder that involves worry that is not triggered by a specific event.  People with GAD often worry excessively about many things in their lives, such as their health and family.  GAD may cause physical symptoms such as restlessness, trouble concentrating, sleep problems, frequent sweating, nausea, diarrhea, headaches, and trembling or muscle twitching.  A mental health specialist can help determine which treatment is best for you. Some people see improvement with one type of therapy. However, other people require a combination of therapies. This information is not intended to replace advice given to you by your health care provider. Make sure you discuss any questions you have with your health care provider. Document Released: 02/20/2013 Document Revised: 09/15/2016 Document Reviewed: 09/15/2016 Elsevier Interactive Patient Education  2018 [employer]  

## 2018-02-16 NOTE — Progress Notes (Signed)
Chief Complaint  Patient presents with  . Medication Refill    lexapro, labetalol    HPI   Essential Hypertension She reports that she is stable with her labetalol She denies hypotension  She does not have headaches, chest pains, dizziness Lab Results  Component Value Date   CREATININE 0.81 12/07/2017    BP Readings from Last 3 Encounters:  02/16/18 126/84  02/09/18 124/84  01/24/18 (!) 143/90   Obesity She is working with Dr. Dalbert GarnetBeasley on weight loss She is working on nutrition and exercise She states that that she is down 20 pounds since last August 2018. Wt Readings from Last 3 Encounters:  02/16/18 217 lb 6.4 oz (98.6 kg)  02/09/18 217 lb (98.4 kg)  01/24/18 218 lb (98.9 kg)  Body mass index is 34.05 kg/m. 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 Anxiety   . Asthma    pt states it is induced by excercise  . Chest pain 02/07/2017  . Dyspnea   . Essential hypertension 02/07/2017  . Headache   . Hx MRSA infection   . Infection    UTI  . Palpitations 02/07/2017  . Preeclampsia   . PVC (premature ventricular contraction) 03/18/2017    Current Outpatient Medications  Medication Sig Dispense Refill  . buPROPion (WELLBUTRIN SR) 150 MG 12 hr tablet Take 1 tablet (150 mg total) by mouth 2 (two) times daily. 180 tablet 3  . escitalopram  (LEXAPRO) 10 MG tablet TAKE 1/2 TABLET FOR 3 DAYS THEN INCREASE TO ONE TABLET BY MOUTH DAILY 90 tablet 3  . labetalol (NORMODYNE) 100 MG tablet Take 1 tablet (100 mg total) by mouth 2 (two) times daily. 180 tablet 1  . metFORMIN (GLUCOPHAGE) 500 MG tablet Take 1 tablet (500 mg total) by mouth 2 (two) times daily with a meal. 60 tablet 0  . Vitamin D, Ergocalciferol, (DRISDOL) 50000 units CAPS capsule Take 1 capsule (50,000 Units total) by mouth every 7 (seven) days. 4 capsule 0   No current facility-administered medications for this visit.     Allergies: No Known Allergies  Past Surgical History:  Procedure Laterality Date  . NO PAST SURGERIES      Social History   Socioeconomic History  . Marital status: Married    Spouse name: Oletha Blendhomas Harl  . Number of children: 2  . Years of education: Not on file  . Highest education level: Not on file  Occupational History  . Not on file  Social Needs  . Financial resource strain: Not on file  . Food insecurity:    Worry: Not on file    Inability: Not on file  . Transportation needs:    Medical: Not on file    Non-medical: Not on file  Tobacco Use  . Smoking status: Never Smoker  . Smokeless tobacco: Never Used  Substance and Sexual Activity  . Alcohol use: Yes    Comment: social  . Drug use: No  . Sexual activity: Yes    Birth control/protection: None  Lifestyle  . Physical activity:    Days per week: Not on file    Minutes per session: Not on file  . Stress: Not on file  Relationships  . Social connections:    Talks on phone: Not on file    Gets together: Not on file    Attends religious service: Not on file    Active member of club or organization: Not on file    Attends meetings of clubs or organizations: Not on file    Relationship status: Not on file  Other Topics Concern  . Not on file  Social History Narrative  . Not on file    Family History  Problem Relation Age of Onset  . Hypertension Mother   .  Cardiomyopathy Mother   . Heart disease Mother   . Anxiety disorder Mother   . Obesity Mother   . Hypertension Father   . Heart disease Father   . Hyperlipidemia Father   . Miscarriages / [place] Son   . Hypertension Brother   . Diabetes Maternal Grandmother   . Heart disease Maternal Grandfather   . Hypertension Maternal Grandfather   . Stroke Maternal Grandfather   . Heart disease Paternal Grandmother   . Hypertension Paternal Grandmother   . Stroke Paternal Grandmother   . Alzheimer's disease Paternal Grandmother   . Hearing loss Neg Hx      ROS Review of Systems See HPI Constitution: No fevers or chills No malaise No diaphoresis Skin: No rash or itching Eyes: no blurry vision, no double vision GU: no dysuria or hematuria Neuro: no dizziness or headaches * all others reviewed and negative   Objective: Vitals:   02/16/18 0951  BP: 126/84  Pulse: 77  Resp: 18  Temp: 98.9 F (37.2 C)  TempSrc: Oral  SpO2: 98%  Weight: 217 lb 6.4 oz (98.6 kg)  Height: 5\' 7"  (1.702 m)    Physical Exam Physical Exam  Constitutional: She is oriented to person, place, and time. She appears well-developed and well-nourished.  HENT:  Head: Normocephalic and atraumatic.  Eyes: Conjunctivae and EOM are normal.  Cardiovascular: Normal rate, regular rhythm and normal heart sounds.   Pulmonary/Chest: Effort normal and breath sounds normal. No respiratory distress. She has no wheezes.  Abdominal: Normal appearance and bowel sounds are normal. There is no tenderness. There is no CVA tenderness.  Neurological: She is alert and oriented to person, place, and time.    Assessment and Plan Jiselle was seen today for medication refill.  Diagnoses and all orders for this visit:  Essential hypertension- stable on current meds Refilled today Reviewed renal function labs -     labetalol (NORMODYNE) 100 MG tablet; Take 1 tablet (100 mg total) by mouth 2 (two) times daily.  Generalized  anxiety disorder- stable on current meds Refilled today -     buPROPion (WELLBUTRIN SR) 150 MG 12 hr tablet; Take 1 tablet (150 mg total) by mouth 2 (two) times daily. -     escitalopram (LEXAPRO) 10 MG tablet; TAKE 1/2 TABLET FOR 3 DAYS THEN INCREASE TO ONE TABLET BY MOUTH DAILY  Class 1 obesity with serious comorbidity and body mass index (BMI) of 34.0 to 34.9 in adult, unspecified obesity  type -  Improved with diet, exercise, stress mgmt, metformin    Kalilah Barua A Totiana Everson

## 2018-02-21 ENCOUNTER — Other Ambulatory Visit (INDEPENDENT_AMBULATORY_CARE_PROVIDER_SITE_OTHER): Payer: Self-pay

## 2018-02-21 DIAGNOSIS — E8881 Metabolic syndrome: Secondary | ICD-10-CM

## 2018-02-21 MED ORDER — METFORMIN HCL 500 MG PO TABS: 500.0000 mg | ORAL_TABLET | Freq: Two times a day (BID) | ORAL | 2 refills | Status: DC

## 2018-02-24 ENCOUNTER — Ambulatory Visit (INDEPENDENT_AMBULATORY_CARE_PROVIDER_SITE_OTHER): Payer: 59 | Admitting: Physician Assistant

## 2018-02-24 VITALS — BP 127/83 | HR 74 | Temp 98.4°F | Ht 67.0 in | Wt 213.0 lb

## 2018-02-24 DIAGNOSIS — E669 Obesity, unspecified: Secondary | ICD-10-CM | POA: Diagnosis not present

## 2018-02-24 DIAGNOSIS — F3289 Other specified depressive episodes: Secondary | ICD-10-CM

## 2018-02-24 DIAGNOSIS — E559 Vitamin D deficiency, unspecified: Secondary | ICD-10-CM

## 2018-02-24 DIAGNOSIS — Z6833 Body mass index (BMI) 33.0-33.9, adult: Secondary | ICD-10-CM | POA: Diagnosis not present

## 2018-02-24 DIAGNOSIS — Z9189 Other specified personal risk factors, not elsewhere classified: Secondary | ICD-10-CM | POA: Diagnosis not present

## 2018-02-24 MED ORDER — BUPROPION HCL ER (SR) 150 MG PO TB12: 150.0000 mg | ORAL_TABLET | Freq: Two times a day (BID) | ORAL | 0 refills | Status: DC

## 2018-02-24 NOTE — Progress Notes (Signed)
Office: [phone]  /  Fax: [phone]   HPI:   Chief Complaint: OBESITY Raven Walters is here to discuss her progress with her obesity treatment plan. She is on the Category 3 plan and is following her eating plan approximately 85 % of the time. She states she is walking, high density for 30 minutes 3 times per week. Raven Walters continues to do well with weight loss. She is following the plan more closely and states her hunger is well controlled.  Her weight is 213 lb (96.6 kg) today and has had a weight loss of 4 pounds over a period of 2 weeks since her last visit. She has lost 7 lbs since starting treatment with [place].  Vitamin D Deficiency Raven Walters has a diagnosis of vitamin D deficiency. She is currently taking prescription Vit D and denies nausea, vomiting or muscle weakness.  At risk for osteopenia and osteoporosis [name] is at higher risk of osteopenia and osteoporosis due to vitamin D deficiency.   Depression with emotional eating behaviors Raven Walters is struggling with emotional eating and using food for comfort to the extent that it is negatively impacting her health. She often snacks when she is not hungry. Raven Walters sometimes feels she is out of control and then feels guilty that she made poor food choices. She has been working on behavior modification techniques to help reduce her emotional eating and has been somewhat successful. Her mood is stable and she shows no sign of suicidal or homicidal ideations.  Depression screen [facility] 2/9 02/16/2018 12/07/2017 06/25/2017 06/18/2017 01/13/2017  Decreased Interest 0 3 0 0 0  Down, Depressed, Hopeless 0 1 0 0 0  PHQ - 2 Score 0 4 0 0 0  Altered sleeping - 3 - - -  Tired, decreased energy - 3 - - -  Change in appetite - 3 - - -  Feeling bad or failure about yourself  - 2 - - -  Trouble concentrating - 3 - - -  Moving slowly or fidgety/restless - 1 - - -  Suicidal thoughts - 0 - - -  PHQ-9 Score - 19 - - -  Difficult doing work/chores - Not difficult at  all - - -   ALLERGIES: No Known Allergies  MEDICATIONS: Current Outpatient Medications on File Prior to Visit  Medication Sig Dispense Refill  . escitalopram (LEXAPRO) 10 MG tablet TAKE 1/2 TABLET FOR 3 DAYS THEN INCREASE TO ONE TABLET BY MOUTH DAILY 90 tablet 3  . labetalol (NORMODYNE) 100 MG tablet Take 1 tablet (100 mg total) by mouth 2 (two) times daily. 180 tablet 1  . metFORMIN (GLUCOPHAGE) 500 MG tablet Take 1 tablet (500 mg total) by mouth 2 (two) times daily with a meal. 180 tablet 2  . Vitamin D, Ergocalciferol, (DRISDOL) 50000 units CAPS capsule Take 1 capsule (50,000 Units total) by mouth every 7 (seven) days. 4 capsule 0   No current facility-administered medications on file prior to visit.     PAST MEDICAL HISTORY: Past Medical History:  Diagnosis Date  . Anxiety   . Asthma    pt states it is induced by excercise  . Chest pain 02/07/2017  . Dyspnea   . Essential hypertension 02/07/2017  . Headache   . Hx MRSA infection   . Infection    UTI  . Palpitations 02/07/2017  . Preeclampsia   . PVC (premature ventricular contraction) 03/18/2017    PAST SURGICAL HISTORY: Past Surgical History:  Procedure Laterality Date  . NO PAST SURGERIES  SOCIAL HISTORY: Social History   Tobacco Use  . Smoking status: Never Smoker  . Smokeless tobacco: Never Used  Substance Use Topics  . Alcohol use: Yes    Comment: social  . Drug use: No    FAMILY HISTORY: Family History  Problem Relation Age of Onset  . Hypertension Mother   . Cardiomyopathy Mother   . Heart disease Mother   . Anxiety disorder Mother   . Obesity Mother   . Hypertension Father   . Heart disease Father   . Hyperlipidemia Father   . Miscarriages / [place] Son   . Hypertension Brother   . Diabetes Maternal Grandmother   . Heart disease Maternal Grandfather   . Hypertension Maternal Grandfather   . Stroke Maternal Grandfather   . Heart disease Paternal Grandmother   . Hypertension Paternal  Grandmother   . Stroke Paternal Grandmother   . Alzheimer's disease Paternal Grandmother   . Hearing loss Neg Hx     ROS: Review of Systems  Constitutional: Positive for weight loss.  Gastrointestinal: Negative for nausea and vomiting.  Musculoskeletal:       Negative muscle weakness  Psychiatric/Behavioral: Positive for depression. Negative for suicidal ideas.    PHYSICAL EXAM: Blood pressure 127/83, pulse 74, temperature 98.4 F (36.9 C), height 5\' 7"  (1.702 m), weight 213 lb (96.6 kg), last menstrual period 02/15/2018, SpO2 100 %, unknown if currently breastfeeding. Body mass index is 33.36 kg/m. Physical Exam  Constitutional: She is oriented to person, place, and time. She appears well-developed and well-nourished.  Cardiovascular: Normal rate.  Pulmonary/Chest: Effort normal.  Musculoskeletal: Normal range of motion.  Neurological: She is oriented to person, place, and time.  Skin: Skin is warm and dry.  Psychiatric: She has a normal mood and affect. Her behavior is normal.  Vitals reviewed.   RECENT LABS AND TESTS: BMET    Component Value Date/Time   NA 137 12/07/2017 1125   K 4.2 12/07/2017 1125   CL 102 12/07/2017 1125   CO2 21 12/07/2017 1125   GLUCOSE 82 12/07/2017 1125   GLUCOSE 86 09/20/2015 1839   BUN 10 12/07/2017 1125   CREATININE 0.81 12/07/2017 1125   CALCIUM 9.0 12/07/2017 1125   GFRNONAA 96 12/07/2017 1125   GFRAA 111 12/07/2017 1125   Lab Results  Component Value Date   HGBA1C 5.4 12/07/2017   Lab Results  Component Value Date   INSULIN 13.7 12/07/2017   CBC    Component Value Date/Time   WBC 9.4 12/07/2017 1125   WBC 16.0 (H) 11/24/2015 0520   RBC 4.31 12/07/2017 1125   RBC 3.63 (L) 11/24/2015 0520   HGB 12.9 12/07/2017 1125   HCT 37.7 12/07/2017 1125   PLT 251 01/13/2017 1604   MCV 88 12/07/2017 1125   MCH 29.9 12/07/2017 1125   MCH 29.8 11/24/2015 0520   MCHC 34.2 12/07/2017 1125   MCHC 33.1 11/24/2015 0520   RDW 14.3  12/07/2017 1125   LYMPHSABS 3.0 12/07/2017 1125   EOSABS 0.2 12/07/2017 1125   BASOSABS 0.0 12/07/2017 1125   Iron/TIBC/Ferritin/ %Sat No results found for: IRON, TIBC, FERRITIN, IRONPCTSAT Lipid Panel     Component Value Date/Time   CHOL 164 12/07/2017 1125   TRIG 89 12/07/2017 1125   HDL 43 12/07/2017 1125   CHOLHDL 3.4 01/13/2017 1604   LDLCALC 103 (H) 12/07/2017 1125   Hepatic Function Panel     Component Value Date/Time   PROT 7.8 12/07/2017 1125   ALBUMIN 4.2 12/07/2017  1125   AST 15 12/07/2017 1125   ALT 15 12/07/2017 1125   ALKPHOS 61 12/07/2017 1125   BILITOT 0.5 12/07/2017 1125      Component Value Date/Time   TSH 0.804 12/07/2017 1125   TSH 1.090 01/13/2017 1604  Results for Klas, Zamyiah N "Shavonna Callan" (MRN [id number]) as of 02/24/2018 14:55  Ref. Range 12/07/2017 11:25  Vitamin D, 25-Hydroxy Latest Ref Range: 30.0 - 100.0 ng/mL 15.6 (L)    ASSESSMENT AND PLAN: Vitamin D deficiency  Other depression - with emotional eating - Plan: buPROPion (WELLBUTRIN SR) 150 MG 12 hr tablet  At risk for osteoporosis  Class 1 obesity with serious comorbidity and body mass index (BMI) of 33.0 to 33.9 in adult, unspecified obesity type  PLAN:  Vitamin D Deficiency [name] was informed that low vitamin D levels contributes to fatigue and are associated with obesity, breast, and colon cancer. Rowene agrees to continue taking prescription Vit D @50 ,000 IU every week and will follow up for routine testing of vitamin D, at least 2-3 times per year. She was informed of the risk of over-replacement of vitamin D and agrees to not increase her dose unless she discusses this with us first. [name] agrees to follow up with our clinic in 2 weeks.  At risk for osteopenia and osteoporosis [name] is at risk for osteopenia and osteoporsis due to her vitamin D deficiency. She was encouraged to take her vitamin D and follow her higher calcium diet and increase strengthening exercise to help  strengthen her bones and decrease her risk of osteopenia and osteoporosis.  Depression with Emotional Eating Behaviors We discussed behavior modification techniques today to help [name] deal with her emotional eating and depression. Kymia agrees to continue taking Wellbutrin SR 150 mg qd BID #180 (90 day supply) and she agrees to follow up with our clinic in 2 weeks.  Obesity [name] is currently in the action stage of change. As such, her goal is to continue with weight loss efforts She has agreed to follow the Category 3 plan [name] has been instructed to work up to a goal of 150 minutes of combined cardio and strengthening exercise per week for weight loss and overall health benefits. We discussed the following Behavioral Modification Strategies today: increasing lean protein intake and work on meal planning and easy cooking plans   [name] has agreed to follow up with our clinic in 2 weeks. She was informed of the importance of frequent follow up visits to maximize her success with intensive lifestyle modifications for her multiple health conditions.   OBESITY BEHAVIORAL INTERVENTION VISIT  Today's visit was # 6 out of 22.  Starting weight: 220 lbs Starting date: 12/07/17 Today's weight : 213 lbs  Today's date: 02/24/2018 Total lbs lost to date: 7 (Patients must lose 7 lbs in the first 6 months to continue with counseling)   ASK: We discussed the diagnosis of obesity with Cristino MartesAlexis N Sherry today and [name] agreed to give us permission to discuss obesity behavioral modification therapy today.  ASSESS: [name] has the diagnosis of obesity and her BMI today is 33.35 [name] is in the action stage of change   ADVISE: [name] was educated on the multiple health risks of obesity as well as the benefit of weight loss to improve her health. She was advised of the need for long term treatment and the importance of lifestyle modifications.  AGREE: Multiple dietary modification options and  treatment options were discussed and  Emerly agreed to the  above obesity treatment plan.   Wilhemena Durie, am acting as transcriptionist for Lacy Duverney, PA-C I, Lacy Duverney [facility], have reviewed this note and agree with its content

## 2018-04-07 ENCOUNTER — Encounter (INDEPENDENT_AMBULATORY_CARE_PROVIDER_SITE_OTHER): Payer: Self-pay

## 2018-04-07 ENCOUNTER — Ambulatory Visit (INDEPENDENT_AMBULATORY_CARE_PROVIDER_SITE_OTHER): Payer: 59 | Admitting: Physician Assistant

## 2018-05-02 ENCOUNTER — Ambulatory Visit (INDEPENDENT_AMBULATORY_CARE_PROVIDER_SITE_OTHER): Payer: 59 | Admitting: Physician Assistant

## 2018-05-02 VITALS — BP 129/87 | HR 72 | Temp 98.1°F | Ht 67.0 in | Wt 223.0 lb

## 2018-05-02 DIAGNOSIS — F3289 Other specified depressive episodes: Secondary | ICD-10-CM

## 2018-05-02 DIAGNOSIS — Z9189 Other specified personal risk factors, not elsewhere classified: Secondary | ICD-10-CM | POA: Diagnosis not present

## 2018-05-02 DIAGNOSIS — Z6834 Body mass index (BMI) 34.0-34.9, adult: Secondary | ICD-10-CM

## 2018-05-02 DIAGNOSIS — E669 Obesity, unspecified: Secondary | ICD-10-CM | POA: Diagnosis not present

## 2018-05-02 DIAGNOSIS — E559 Vitamin D deficiency, unspecified: Secondary | ICD-10-CM

## 2018-05-02 MED ORDER — VITAMIN D (ERGOCALCIFEROL) 1.25 MG (50000 UNIT) PO CAPS: 50000.0000 [IU] | ORAL_CAPSULE | ORAL | 0 refills | Status: DC

## 2018-05-02 NOTE — Progress Notes (Signed)
Office: [phone]  /  Fax: [phone]   HPI:   Chief Complaint: OBESITY Raven Walters is here to discuss her progress with her obesity treatment plan. She is on the Category 3 plan and is following her eating plan approximately 35 % of the time. She states she is walking for 20 minutes 7 times per week. Raven Walters's last follow up was 02/24/18. She started a new job and has not been mindful of her eating. Also, she eats out more.  Her weight is 223 lb (101.2 kg) today and has gained 10 pounds since her last visit. She has lost 0 lbs since starting treatment with [place].  Vitamin D Deficiency Raven Walters has a diagnosis of vitamin D deficiency. She is currently taking prescription Vit D and denies nausea, vomiting or muscle weakness.  At risk for osteopenia and osteoporosis [name] is at higher risk of osteopenia and osteoporosis due to vitamin D deficiency.   Depression with emotional eating behaviors Raven Walters has been taking Wellbutrin only once in the morning, her mood is stable. She is also on Lexapro. Raven Walters struggles with emotional eating and using food for comfort to the extent that it is negatively impacting her health. She often snacks when she is not hungry. Raven Walters sometimes feels she is out of control and then feels guilty that she made poor food choices. She has been working on behavior modification techniques to help reduce her emotional eating and has been somewhat successful. She shows no sign of suicidal or homicidal ideations.  Depression screen [facility] 2/9 02/16/2018 12/07/2017 06/25/2017 06/18/2017 01/13/2017  Decreased Interest 0 3 0 0 0  Down, Depressed, Hopeless 0 1 0 0 0  PHQ - 2 Score 0 4 0 0 0  Altered sleeping - 3 - - -  Tired, decreased energy - 3 - - -  Change in appetite - 3 - - -  Feeling bad or failure about yourself  - 2 - - -  Trouble concentrating - 3 - - -  Moving slowly or fidgety/restless - 1 - - -  Suicidal thoughts - 0 - - -  PHQ-9 Score - 19 - - -  Difficult doing  work/chores - Not difficult at all - - -    ALLERGIES: No Known Allergies  MEDICATIONS: Current Outpatient Medications on File Prior to Visit  Medication Sig Dispense Refill  . buPROPion (WELLBUTRIN SR) 150 MG 12 hr tablet Take 1 tablet (150 mg total) by mouth 2 (two) times daily. (Patient taking differently: Take 150 mg by mouth daily. ) 180 tablet 0  . escitalopram (LEXAPRO) 10 MG tablet TAKE 1/2 TABLET FOR 3 DAYS THEN INCREASE TO ONE TABLET BY MOUTH DAILY 90 tablet 3  . labetalol (NORMODYNE) 100 MG tablet Take 1 tablet (100 mg total) by mouth 2 (two) times daily. 180 tablet 1  . metFORMIN (GLUCOPHAGE) 500 MG tablet Take 1 tablet (500 mg total) by mouth 2 (two) times daily with a meal. 180 tablet 2  . Vitamin D, Ergocalciferol, (DRISDOL) 50000 units CAPS capsule Take 1 capsule (50,000 Units total) by mouth every 7 (seven) days. 4 capsule 0   No current facility-administered medications on file prior to visit.     PAST MEDICAL HISTORY: Past Medical History:  Diagnosis Date  . Anxiety   . Asthma    pt states it is induced by excercise  . Chest pain 02/07/2017  . Dyspnea   . Essential hypertension 02/07/2017  . Headache   . Hx MRSA infection   .  Infection    UTI  . Palpitations 02/07/2017  . Preeclampsia   . PVC (premature ventricular contraction) 03/18/2017    PAST SURGICAL HISTORY: Past Surgical History:  Procedure Laterality Date  . NO PAST SURGERIES      SOCIAL HISTORY: Social History   Tobacco Use  . Smoking status: Never Smoker  . Smokeless tobacco: Never Used  Substance Use Topics  . Alcohol use: Yes    Comment: social  . Drug use: No    FAMILY HISTORY: Family History  Problem Relation Age of Onset  . Hypertension Mother   . Cardiomyopathy Mother   . Heart disease Mother   . Anxiety disorder Mother   . Obesity Mother   . Hypertension Father   . Heart disease Father   . Hyperlipidemia Father   . Miscarriages [place]   . Hypertension Brother     . Diabetes Maternal Grandmother   . Heart disease Maternal Grandfather   . Hypertension Maternal Grandfather   . Stroke Maternal Grandfather   . Heart disease Paternal Grandmother   . Hypertension Paternal Grandmother   . Stroke Paternal Grandmother   . Alzheimer's disease Paternal Grandmother   . Hearing loss Neg Hx     ROS: Review of Systems  Constitutional: Negative for weight loss.  Gastrointestinal: Negative for nausea and vomiting.  Musculoskeletal:       Negative muscle weakness  Psychiatric/Behavioral: Positive for depression. Negative for suicidal ideas.    PHYSICAL EXAM: Blood pressure 129/87, pulse 72, temperature 98.1 F (36.7 C), temperature source Oral, height 5\' 7"  (1.702 m), weight 223 lb (101.2 kg), last menstrual period 05/23/2017, SpO2 100 %, unknown if currently breastfeeding. Body mass index is 34.93 kg/m. Physical Exam  Constitutional: She is oriented to person, place, and time. She appears well-developed and well-nourished.  Cardiovascular: Normal rate.  Pulmonary/Chest: Effort normal.  Musculoskeletal: Normal range of motion.  Neurological: She is oriented to person, place, and time.  Skin: Skin is warm and dry.  Psychiatric: She has a normal mood and affect. Her behavior is normal.  Vitals reviewed.   RECENT LABS AND TESTS: BMET    Component Value Date/Time   NA 137 12/07/2017 1125   K 4.2 12/07/2017 1125   CL 102 12/07/2017 1125   CO2 21 12/07/2017 1125   GLUCOSE 82 12/07/2017 1125   GLUCOSE 86 09/20/2015 1839   BUN 10 12/07/2017 1125   CREATININE 0.81 12/07/2017 1125   CALCIUM 9.0 12/07/2017 1125   GFRNONAA 96 12/07/2017 1125   GFRAA 111 12/07/2017 1125   Lab Results  Component Value Date   HGBA1C 5.4 12/07/2017   Lab Results  Component Value Date   INSULIN 13.7 12/07/2017   CBC    Component Value Date/Time   WBC 9.4 12/07/2017 1125   WBC 16.0 (H) 11/24/2015 0520   RBC 4.31 12/07/2017 1125   RBC 3.63 (L) 11/24/2015 0520    HGB 12.9 12/07/2017 1125   HCT 37.7 12/07/2017 1125   PLT 251 01/13/2017 1604   MCV 88 12/07/2017 1125   MCH 29.9 12/07/2017 1125   MCH 29.8 11/24/2015 0520   MCHC 34.2 12/07/2017 1125   MCHC 33.1 11/24/2015 0520   RDW 14.3 12/07/2017 1125   LYMPHSABS 3.0 12/07/2017 1125   EOSABS 0.2 12/07/2017 1125   BASOSABS 0.0 12/07/2017 1125   Iron/TIBC/Ferritin/ %Sat No results found for: IRON, TIBC, FERRITIN, IRONPCTSAT Lipid Panel     Component Value Date/Time   CHOL 164 12/07/2017 1125  TRIG 89 12/07/2017 1125   HDL 43 12/07/2017 1125   CHOLHDL 3.4 01/13/2017 1604   LDLCALC 103 (H) 12/07/2017 1125   Hepatic Function Panel     Component Value Date/Time   PROT 7.8 12/07/2017 1125   ALBUMIN 4.2 12/07/2017 1125   AST 15 12/07/2017 1125   ALT 15 12/07/2017 1125   ALKPHOS 61 12/07/2017 1125   BILITOT 0.5 12/07/2017 1125      Component Value Date/Time   TSH 0.804 12/07/2017 1125   TSH 1.090 01/13/2017 1604  Results for Glandon, Mindel N "Sophiea Mckiernan" (MRN [id number]) as of 05/02/2018 10:04  Ref. Range 12/07/2017 11:25  Vitamin D, 25-Hydroxy Latest Ref Range: 30.0 - 100.0 ng/mL 15.6 (L)    ASSESSMENT AND PLAN: Vitamin D deficiency - Plan: Vitamin D, Ergocalciferol, (DRISDOL) 50000 units CAPS capsule  Other depression - with emotional eating  At risk for osteoporosis  Class 1 obesity with serious comorbidity and body mass index (BMI) of 34.0 to 34.9 in adult, unspecified obesity type  PLAN:  Vitamin D Deficiency Keily was informed that low vitamin D levels contributes to fatigue and are associated with obesity, breast, and colon cancer. Nerida agrees to continue taking prescription Vit D @50 ,000 IU every week #4 and we will refill for 1 month. She will follow up for routine testing of vitamin D, at least 2-3 times per year. She was informed of the risk of over-replacement of vitamin D and agrees to not increase her dose unless she discusses this with [place] first. Javeria agrees to  follow up with our clinic in 2 weeks.  At risk for osteopenia and osteoporosis Kynlie is at risk for osteopenia and osteoporsis due to her vitamin D deficiency. She was encouraged to take her vitamin D and follow her higher calcium diet and increase strengthening exercise to help strengthen her bones and decrease her risk of osteopenia and osteoporosis.  Depression with Emotional Eating Behaviors We discussed behavior modification techniques today to help Kandiss deal with her emotional eating and depression. Imanni agrees to decrease Wellbutrin SR to 150 mg once daily, and she agrees to follow up with our clinic in 2 weeks.  Obesity Josephene is currently in the action stage of change. As such, her goal is to continue with weight loss efforts She has agreed to portion control better and make smarter food choices, such as increase vegetables and decrease simple carbohydrates  Lennon has been instructed to work up to a goal of 150 minutes of combined cardio and strengthening exercise per week for weight loss and overall health benefits. We discussed the following Behavioral Modification Strategies today: decrease eating out, decrease junk food, and planning for success   Fujiko has agreed to follow up with our clinic in 2 weeks. She was informed of the importance of frequent follow up visits to maximize her success with intensive lifestyle modifications for her multiple health conditions.   OBESITY BEHAVIORAL INTERVENTION VISIT  Today's visit was # 7 out of 22.  Starting weight: 220 lbs Starting date: 12/07/17 Today's weight : 223 lbs  Today's date: 05/02/2018 Total lbs lost to date: 0 (Patients must lose 7 lbs in the first 6 months to continue with counseling)   ASK: We discussed the diagnosis of obesity with Cristino Martes today and Lacole agreed to give [place] permission to discuss obesity behavioral modification therapy today.  ASSESS: Marijke has the diagnosis of obesity and her BMI today  is 34.92 Claira is in the action stage of  change   ADVISE: Nastasha was educated on the multiple health risks of obesity as well as the benefit of weight loss to improve her health. She was advised of the need for long term treatment and the importance of lifestyle modifications.  AGREE: Multiple dietary modification options and treatment options were discussed and  Kandra agreed to the above obesity treatment plan.   Trude Mcburney, am acting as transcriptionist for Illa Level, PA-C I, Illa Level [facility], have reviewed this note and agree with its content

## 2018-05-18 ENCOUNTER — Encounter (INDEPENDENT_AMBULATORY_CARE_PROVIDER_SITE_OTHER): Payer: Self-pay

## 2018-05-18 ENCOUNTER — Ambulatory Visit (INDEPENDENT_AMBULATORY_CARE_PROVIDER_SITE_OTHER): Payer: 59 | Admitting: Physician Assistant

## 2018-05-19 ENCOUNTER — Ambulatory Visit: Payer: 59 | Admitting: Family Medicine

## 2018-06-09 ENCOUNTER — Ambulatory Visit: Payer: Self-pay | Admitting: Family Medicine

## 2018-06-09 NOTE — Progress Notes (Deleted)
No chief complaint on file.   HPI  4 review of systems  Past Medical History:  Diagnosis Date  . Anxiety   . Asthma    pt states it is induced by excercise  . Chest pain 02/07/2017  . Dyspnea   . Essential hypertension 02/07/2017  . Headache   . Hx MRSA infection   . Infection    UTI  . Palpitations 02/07/2017  . Preeclampsia   . PVC (premature ventricular contraction) 03/18/2017    Current Outpatient Medications  Medication Sig Dispense Refill  . buPROPion (WELLBUTRIN SR) 150 MG 12 hr tablet Take 1 tablet (150 mg total) by mouth 2 (two) times daily. (Patient taking differently: Take 150 mg by mouth daily. ) 180 tablet 0  . escitalopram (LEXAPRO) 10 MG tablet TAKE 1/2 TABLET FOR 3 DAYS THEN INCREASE TO ONE TABLET BY MOUTH DAILY 90 tablet 3  . labetalol (NORMODYNE) 100 MG tablet Take 1 tablet (100 mg total) by mouth 2 (two) times daily. 180 tablet 1  . metFORMIN (GLUCOPHAGE) 500 MG tablet Take 1 tablet (500 mg total) by mouth 2 (two) times daily with a meal. 180 tablet 2  . Vitamin D, Ergocalciferol, (DRISDOL) 50000 units CAPS capsule Take 1 capsule (50,000 Units total) by mouth every 7 (seven) days. 4 capsule 0   No current facility-administered medications for this visit.     Allergies: No Known Allergies  Past Surgical History:  Procedure Laterality Date  . NO PAST SURGERIES      Social History   Socioeconomic History  . Marital status: Married    Spouse name: Raven Walters  . Number of children: 2  . Years of education: Not on file  . Highest education level: Not on file  Occupational History  . Not on file  Social Needs  . Financial resource strain: Not on file  . Food insecurity:    Worry: Not on file    Inability: Not on file  . Transportation needs:    Medical: Not on file    Non-medical: Not on file  Tobacco Use  . Smoking status: Never Smoker  . Smokeless tobacco: Never Used  Substance and Sexual Activity  . Alcohol use: Yes    Comment: social  .  Drug use: No  . Sexual activity: Yes    Birth control/protection: None  Lifestyle  . Physical activity:    Days per week: Not on file    Minutes per session: Not on file  . Stress: Not on file  Relationships  . Social connections:    Talks on phone: Not on file    Gets together: Not on file    Attends religious service: Not on file    Active member of club or organization: Not on file    Attends meetings of clubs or organizations: Not on file    Relationship status: Not on file  Other Topics Concern  . Not on file  Social History Narrative  . Not on file    Family History  Problem Relation Age of Onset  . Hypertension Mother   . Cardiomyopathy Mother   . Heart disease Mother   . Anxiety disorder Mother   . Obesity Mother   . Hypertension Father   . Heart disease Father   . Hyperlipidemia Father   . Miscarriages / [place] Son   . Hypertension Brother   . Diabetes Maternal Grandmother   . Heart disease Maternal Grandfather   . Hypertension Maternal Grandfather   .  Stroke Maternal Grandfather   . Heart disease Paternal Grandmother   . Hypertension Paternal Grandmother   . Stroke Paternal Grandmother   . Alzheimer's disease Paternal Grandmother   . Hearing loss Neg Hx      ROS Review of Systems See HPI Constitution: No fevers or chills No malaise No diaphoresis Skin: No rash or itching Eyes: no blurry vision, no double vision GU: no dysuria or hematuria Neuro: no dizziness or headaches * all others reviewed and negative   Objective: There were no vitals filed for this visit.  Physical Exam  Assessment and Plan There are no diagnoses linked to this encounter.   Raven Walters

## 2018-06-21 NOTE — Progress Notes (Signed)
Chief Complaint  Patient presents with  . Hypertension    f/u    HPI  Hypertension: Patient here for follow-up of elevated blood pressure. She is exercising and is adherent to low salt diet.  Blood pressure is well controlled at home. Cardiac symptoms none. Patient denies chest pain, chest pressure/discomfort, claudication, exertional chest pressure/discomfort, fatigue, irregular heart beat, near-syncope and palpitations.  Cardiovascular risk factors: hypertension. Use of agents associated with hypertension: none. History of target organ damage: none.   BP Readings from Last 3 Encounters:  06/22/18 132/80  05/02/18 129/87  02/24/18 127/83   Lab Results  Component Value Date   CREATININE 0.81 12/07/2017     She reports that she is not exercising   Wt Readings from Last 3 Encounters:  06/22/18 227 lb 9.6 oz (103.2 kg)  05/02/18 223 lb (101.2 kg)  02/24/18 213 lb (96.6 kg)    Allergic rhinitis She reports that she is congested  Typically environmental changes like weather or dust are her triggers No asthma No wheezing    Past Medical History:  Diagnosis Date  . Anxiety   . Asthma    pt states it is induced by excercise  . Chest pain 02/07/2017  . Dyspnea   . Essential hypertension 02/07/2017  . Headache   . Hx MRSA infection   . Infection    UTI  . Palpitations 02/07/2017  . Preeclampsia   . PVC (premature ventricular contraction) 03/18/2017    Current Outpatient Medications  Medication Sig Dispense Refill  . buPROPion (WELLBUTRIN SR) 150 MG 12 hr tablet Take 1 tablet (150 mg total) by mouth 2 (two) times daily. (Patient taking differently: Take 150 mg by mouth daily. ) 180 tablet 0  . escitalopram (LEXAPRO) 10 MG tablet TAKE 1/2 TABLET FOR 3 DAYS THEN INCREASE TO ONE TABLET BY MOUTH DAILY 90 tablet 3  . labetalol (NORMODYNE) 100 MG tablet Take 1 tablet (100 mg total) by mouth 2 (two) times daily. 180 tablet 1  . metFORMIN (GLUCOPHAGE) 500 MG tablet Take 1 tablet  (500 mg total) by mouth 2 (two) times daily with a meal. 180 tablet 2  . Vitamin D, Ergocalciferol, (DRISDOL) 50000 units CAPS capsule Take 1 capsule (50,000 Units total) by mouth every 7 (seven) days. 4 capsule 0  . fluticasone (FLONASE) 50 MCG/ACT nasal spray Place 2 sprays into both nostrils daily. 16 g 6   No current facility-administered medications for this visit.     Allergies: No Known Allergies  Past Surgical History:  Procedure Laterality Date  . NO PAST SURGERIES      Social History   Socioeconomic History  . Marital status: Married    Spouse name: Oletha Blendhomas Yazdi  . Number of children: 2  . Years of education: Not on file  . Highest education level: Not on file  Occupational History  . Not on file  Social Needs  . Financial resource strain: Not on file  . Food insecurity:    Worry: Not on file    Inability: Not on file  . Transportation needs:    Medical: Not on file    Non-medical: Not on file  Tobacco Use  . Smoking status: Never Smoker  . Smokeless tobacco: Never Used  Substance and Sexual Activity  . Alcohol use: Yes    Comment: social  . Drug use: No  . Sexual activity: Yes    Birth control/protection: IUD  Lifestyle  . Physical activity:    Days per week: Not  on file    Minutes per session: Not on file  . Stress: Not on file  Relationships  . Social connections:    Talks on phone: Not on file    Gets together: Not on file    Attends religious service: Not on file    Active member of club or organization: Not on file    Attends meetings of clubs or organizations: Not on file    Relationship status: Not on file  Other Topics Concern  . Not on file  Social History Narrative  . Not on file    Family History  Problem Relation Age of Onset  . Hypertension Mother   . Cardiomyopathy Mother   . Heart disease Mother   . Anxiety disorder Mother   . Obesity Mother   . Hypertension Father   . Heart disease Father   . Hyperlipidemia Father   .  [place] / [place]   . Hypertension Brother   . Diabetes Maternal Grandmother   . Heart disease Maternal Grandfather   . Hypertension Maternal Grandfather   . Stroke Maternal Grandfather   . Heart disease Paternal Grandmother   . Hypertension Paternal Grandmother   . Stroke Paternal Grandmother   . Alzheimer's disease Paternal Grandmother   . Hearing loss Neg Hx      ROS Review of Systems See HPI Constitution: No fevers or chills No malaise No diaphoresis Skin: No rash or itching Eyes: no blurry vision, no double vision GU: no dysuria or hematuria Neuro: no dizziness or headaches all others reviewed and negative   Objective: Vitals:   06/22/18 1457  BP: 132/80  Pulse: 87  Resp: 20  Temp: 99.6 F (37.6 C)  TempSrc: Oral  SpO2: 99%  Weight: 227 lb 9.6 oz (103.2 kg)  Height: 5' 7.91" (1.725 m)    Physical Exam Physical Exam  Constitutional: She is oriented to person, place, and time. She appears well-developed and well-nourished.  HENT:  Head: Normocephalic and atraumatic.  Eyes: Conjunctivae and EOM are normal.  Cardiovascular: Normal rate, regular rhythm and normal heart sounds.   Pulmonary/Chest: Effort normal and breath sounds normal. No respiratory distress. She has no wheezes.  Abdominal: Normal appearance and bowel sounds are normal. There is no tenderness. There is no CVA tenderness.  Neurological: She is alert and oriented to person, place, and time.    Assessment and Plan [name] was seen today for hypertension.  Diagnoses and all orders for this visit:  Essential hypertension- Patient's blood pressure is at goal of 139/89 or less. Condition is stable. Continue current medications and treatment plan. I recommend that you exercise for 30-45 minutes 5 days a week. I also recommend a balanced diet with fruits and vegetables every day, lean meats, and little fried foods. The DASH diet (you can find this online) is a good example of this.  -      labetalol (NORMODYNE) 100 MG tablet; Take 1 tablet (100 mg total) by mouth 2 (two) times daily.  Vitamin D deficiency- continue vitamin D -     VITAMIN D 25 Hydroxy (Vit-D Deficiency, Fractures); Future  Allergic rhinitis -     fluticasone (FLONASE) 50 MCG/ACT nasal spray; Place 2 sprays into both nostrils daily.     Kimyatta Lecy A Aviyon Hocevar

## 2018-06-22 ENCOUNTER — Other Ambulatory Visit: Payer: Self-pay

## 2018-06-22 ENCOUNTER — Ambulatory Visit: Payer: BLUE CROSS/BLUE SHIELD | Admitting: Family Medicine

## 2018-06-22 ENCOUNTER — Encounter: Payer: Self-pay | Admitting: Family Medicine

## 2018-06-22 VITALS — BP 132/80 | HR 87 | Temp 99.6°F | Resp 20 | Ht 67.91 in | Wt 227.6 lb

## 2018-06-22 DIAGNOSIS — I10 Essential (primary) hypertension: Secondary | ICD-10-CM

## 2018-06-22 DIAGNOSIS — J301 Allergic rhinitis due to pollen: Secondary | ICD-10-CM

## 2018-06-22 DIAGNOSIS — E559 Vitamin D deficiency, unspecified: Secondary | ICD-10-CM

## 2018-06-22 MED ORDER — LABETALOL HCL 100 MG PO TABS: 100.0000 mg | ORAL_TABLET | Freq: Two times a day (BID) | ORAL | 1 refills | Status: DC

## 2018-06-22 MED ORDER — FLUTICASONE PROPIONATE 50 MCG/ACT NA SUSP: 2.0000 | Freq: Every day | NASAL | 6 refills | Status: AC

## 2018-06-22 NOTE — Patient Instructions (Addendum)
I will contact you with your lab results within the next 2 weeks.  If you have not heard from us then please contact us. The fastest way to get your results is to register for My Chart.   IF you received an x-ray today, you will receive an invoice from [facility]. Please contact [facility] at [phone] with questions or concerns regarding your invoice.   IF you received labwork today, you will receive an invoice from [facility]. Please contact [facility] at [phone] with questions or concerns regarding your invoice.   Our billing staff will not be able to assist you with questions regarding bills from these companies.  You will be contacted with the lab results as soon as they are available. The fastest way to get your results is to activate your My Chart account. Instructions are located on the last page of this paperwork. If you have not heard from us regarding the results in 2 weeks, please contact this office.     How to Take Your Blood Pressure Blood pressure is a measurement of how strongly your blood is pressing against the walls of your arteries. Arteries are blood vessels that carry blood from your heart throughout your body. Your health care provider takes your blood pressure at each office visit. You can also take your own blood pressure at home with a blood pressure machine. You may need to take your own blood pressure:  To confirm a diagnosis of high blood pressure (hypertension).  To monitor your blood pressure over time.  To make sure your blood pressure medicine is working.  Supplies needed: To take your blood pressure, you will need a blood pressure machine. You can buy a blood pressure machine, or blood pressure monitor, at most drugstores or online. There are several types of home blood pressure monitors. When choosing one, consider the following:  Choose a monitor that has an arm cuff.  Choose a monitor that wraps snugly around your upper  arm. You should be able to fit only one finger between your arm and the cuff.  Do not choose a monitor that measures your blood pressure from your wrist or finger.  Your health care provider can suggest a reliable monitor that will meet your needs. How to prepare To get the most accurate reading, avoid the following for 30 minutes before you check your blood pressure:  Drinking caffeine.  Drinking alcohol.  Eating.  Smoking.  Exercising.  Five minutes before you check your blood pressure:  Empty your bladder.  Sit quietly without talking in a dining chair, rather than in a soft couch or armchair.  How to take your blood pressure To check your blood pressure, follow the instructions in the manual that came with your blood pressure monitor. If you have a digital blood pressure monitor, the instructions may be as follows: 1. Sit up straight. 2. Place your feet on the floor. Do not cross your ankles or legs. 3. Rest your left arm at the level of your heart on a table or desk or on the arm of a chair. 4. Pull up your shirt sleeve. 5. Wrap the blood pressure cuff around the upper part of your left arm, 1 inch (2.5 cm) above your elbow. It is best to wrap the cuff around bare skin. 6. Fit the cuff snugly around your arm. You should be able to place only one finger between the cuff and your arm. 7. Position the cord inside the groove of  your elbow. 8. Press the power button. 9. Sit quietly while the cuff inflates and deflates. 10. Read the digital reading on the monitor screen and write it down (record it). 11. Wait 2-3 minutes, then repeat the steps, starting at step 1.  What does my blood pressure reading mean? A blood pressure reading consists of a higher number over a lower number. Ideally, your blood pressure should be below 120/80. The first ("top") number is called the systolic pressure. It is a measure of the pressure in your arteries as your heart beats. The second ("bottom")  number is called the diastolic pressure. It is a measure of the pressure in your arteries as the heart relaxes. Blood pressure is classified into four stages. The following are the stages for adults who do not have a short-term serious illness or a chronic condition. Systolic pressure and diastolic pressure are measured in a unit called mm Hg. Normal  Systolic pressure: below 120.  Diastolic pressure: below 80. Elevated  Systolic pressure: 120-129.  Diastolic pressure: below 80. Hypertension stage 1  Systolic pressure: 130-139.  Diastolic pressure: 80-89. Hypertension stage 2  Systolic pressure: 140 or above.  Diastolic pressure: 90 or above. You can have prehypertension or hypertension even if only the systolic or only the diastolic number in your reading is higher than normal. Follow these instructions at home:  Check your blood pressure as often as recommended by your health care provider.  Take your monitor to the next appointment with your health care provider to make sure: ? That you are using it correctly. ? That it provides accurate readings.  Be sure you understand what your goal blood pressure numbers are.  Tell your health care provider if you are having any side effects from blood pressure medicine. Contact a health care provider if:  Your blood pressure is consistently high. Get help right away if:  Your systolic blood pressure is higher than 180.  Your diastolic blood pressure is higher than 110. This information is not intended to replace advice given to you by your health care provider. Make sure you discuss any questions you have with your health care provider. Document Released: 04/03/2016 Document Revised: 06/16/2016 Document Reviewed: 04/03/2016 Elsevier Interactive Patient Education  [employer]2018 [employer]

## 2018-07-15 ENCOUNTER — Other Ambulatory Visit (INDEPENDENT_AMBULATORY_CARE_PROVIDER_SITE_OTHER): Payer: Self-pay | Admitting: Physician Assistant

## 2018-07-15 DIAGNOSIS — E559 Vitamin D deficiency, unspecified: Secondary | ICD-10-CM

## 2018-07-15 DIAGNOSIS — F3289 Other specified depressive episodes: Secondary | ICD-10-CM

## 2018-08-11 ENCOUNTER — Emergency Department (HOSPITAL_COMMUNITY)
Admission: EM | Admit: 2018-08-11 | Discharge: 2018-08-11 | Disposition: A | Discharge status: Home / Self Care | Payer: BLUE CROSS/BLUE SHIELD | Attending: Emergency Medicine | Admitting: Emergency Medicine

## 2018-08-11 ENCOUNTER — Ambulatory Visit (INDEPENDENT_AMBULATORY_CARE_PROVIDER_SITE_OTHER): Payer: BLUE CROSS/BLUE SHIELD | Admitting: Bariatrics

## 2018-08-11 ENCOUNTER — Encounter (HOSPITAL_COMMUNITY): Payer: Self-pay

## 2018-08-11 ENCOUNTER — Emergency Department (HOSPITAL_COMMUNITY): Payer: BLUE CROSS/BLUE SHIELD

## 2018-08-11 ENCOUNTER — Other Ambulatory Visit: Payer: Self-pay

## 2018-08-11 VITALS — BP 118/80 | HR 77 | Temp 98.4°F | Ht 67.0 in | Wt 229.0 lb

## 2018-08-11 DIAGNOSIS — I10 Essential (primary) hypertension: Secondary | ICD-10-CM | POA: Diagnosis not present

## 2018-08-11 DIAGNOSIS — R0789 Other chest pain: Secondary | ICD-10-CM | POA: Diagnosis not present

## 2018-08-11 DIAGNOSIS — M25512 Pain in left shoulder: Secondary | ICD-10-CM | POA: Insufficient documentation

## 2018-08-11 DIAGNOSIS — Z6835 Body mass index (BMI) 35.0-35.9, adult: Secondary | ICD-10-CM

## 2018-08-11 DIAGNOSIS — Z79899 Other long term (current) drug therapy: Secondary | ICD-10-CM | POA: Diagnosis not present

## 2018-08-11 DIAGNOSIS — R079 Chest pain, unspecified: Secondary | ICD-10-CM | POA: Diagnosis not present

## 2018-08-11 LAB — BASIC METABOLIC PANEL
Anion gap: 8 (ref 5–15)
BUN: 10 mg/dL (ref 6–20)
CO2: 24 mmol/L (ref 22–32)
Calcium: 8.8 mg/dL — ABNORMAL LOW (ref 8.9–10.3)
Chloride: 105 mmol/L (ref 98–111)
Creatinine, Ser: 0.81 mg/dL (ref 0.44–1.00)
GFR calc Af Amer: >60 mL/min (ref >60)
GFR calc non Af Amer: >60 mL/min (ref >60)
Glucose, Bld: 95 mg/dL (ref 70–99)
Potassium: 4.1 mmol/L (ref 3.5–5.1)
SODIUM: 137 mmol/L (ref 135–145)

## 2018-08-11 LAB — CBC
HEMATOCRIT: 38.5 % (ref 36.0–46.0)
Hemoglobin: 12.4 g/dL (ref 12.0–15.0)
MCH: 29.8 pg (ref 26.0–34.0)
MCHC: 32.2 g/dL (ref 30.0–36.0)
MCV: 92.5 fL (ref 78.0–100.0)
Platelets: 247 10*3/uL (ref 150–400)
RBC: 4.16 MIL/uL (ref 3.87–5.11)
RDW: 13.5 % (ref 11.5–15.5)
WBC: 9.5 10*3/uL (ref 4.0–10.5)

## 2018-08-11 LAB — I-STAT BETA HCG BLOOD, ED (MC, WL, AP ONLY)

## 2018-08-11 LAB — I-STAT TROPONIN, ED: Troponin i, poc: 0 ng/mL (ref 0.00–0.08)

## 2018-08-11 LAB — TROPONIN I: Troponin I: <0.03 ng/mL (ref <0.03)

## 2018-08-11 MED ORDER — IBUPROFEN 400 MG PO TABS
600.0000 mg | ORAL_TABLET | Freq: Once | ORAL | Status: AC
  Administered 2018-08-11: 600 mg via ORAL
  Filled 2018-08-11: qty 1

## 2018-08-11 NOTE — Discharge Instructions (Signed)
Follow up with primary care doctor in 2 days.  Return to the ED for any worsening or other concerns.  Take motrin or tylenol every 6 hours as needed for pain.

## 2018-08-11 NOTE — ED Provider Notes (Signed)
[facility] EMERGENCY DEPARTMENT Provider Note   CSN: [id number] Arrival date & time: 08/11/18  0800     History   Chief Complaint Chief Complaint  Patient presents with  . Chest Pain    HPI Raven Walters is a 32 y.o. female.  HPI   Patient is a 32 year old female with PMHx of asthma, HTN, DM, and anxiety who presents with central sharp pleuritic constant chest pain x this morning.  Patient states she awoke with pain.  Pain is currently rated at a 5 out of 10 in intensity.  Pain is left-sided and worsens when taking a deep breath.  Nonexertional.  She complains of associated pain in her left scapula.  She denies diaphoresis, nausea, fever, vomiting, shortness of breath, abdominal pain, and all other complaints.   Patient went to her nutrition appointment this morning however they referred her to the ED for further evaluation.  Recently started an exercise regiment over the last week in preparation for 5k and weight loss. FHx significant fo mother who had an MI < 5 yo.   Past Medical History:  Diagnosis Date  . Anxiety   . Asthma    pt states it is induced by excercise  . Chest pain 02/07/2017  . Dyspnea   . Essential hypertension 02/07/2017  . Headache   . Hx MRSA infection   . Infection    UTI  . Palpitations 02/07/2017  . Preeclampsia   . PVC (premature ventricular contraction) 03/18/2017    Patient Active Problem List   Diagnosis Date Noted  . Vitamin D deficiency 12/21/2017  . Insulin resistance 12/21/2017  . Other fatigue 12/07/2017  . Shortness of breath on exertion 12/07/2017  . Depression screening 12/07/2017  . PVC (premature ventricular contraction) 03/18/2017  . Palpitations 02/07/2017  . Chest pain 02/07/2017  . Essential hypertension 02/07/2017  . Intrauterine normal pregnancy 11/23/2015    Past Surgical History:  Procedure Laterality Date  . NO PAST SURGERIES       OB History    Gravida  4   Para  2   Term  2   Preterm        AB  1   Living  2     SAB  1   TAB      Ectopic      Multiple  0   Live Births  2            Home Medications    Prior to Admission medications   Medication Sig Start Date End Date Taking? Authorizing Provider  labetalol (NORMODYNE) 100 MG tablet Take 1 tablet (100 mg total) by mouth 2 (two) times daily. 06/22/18  Yes Doristine Bosworth, MD  levonorgestrel (MIRENA, 52 MG,) 20 MCG/24HR IUD 1 Device by Intrauterine route once. 09-2017   Yes [provider]  metFORMIN (GLUCOPHAGE) 500 MG tablet Take 1 tablet (500 mg total) by mouth 2 (two) times daily with a meal. 02/21/18  Yes Camila Li, Sahar M, PA-C  Vitamin D, Ergocalciferol, (DRISDOL) 50000 units CAPS capsule Take 1 capsule (50,000 Units total) by mouth every 7 (seven) days. 05/02/18  Yes Camila Li, Sahar M, PA-C  buPROPion (WELLBUTRIN SR) 150 MG 12 hr tablet Take 1 tablet (150 mg total) by mouth 2 (two) times daily. Patient not taking: Reported on 08/11/2018 02/24/18   Demetrio Lapping, PA-C  escitalopram (LEXAPRO) 10 MG tablet TAKE 1/2 TABLET FOR 3 DAYS THEN INCREASE TO ONE TABLET BY MOUTH DAILY Patient  not taking: Reported on 08/11/2018 02/16/18   Doristine Bosworth, MD  fluticasone (FLONASE) 50 MCG/ACT nasal spray Place 2 sprays into both nostrils daily. Patient not taking: Reported on 08/11/2018 06/22/18   Doristine Bosworth, MD    Family History Family History  Problem Relation Age of Onset  . Hypertension Mother   . Cardiomyopathy Mother   . Heart disease Mother   . Anxiety disorder Mother   . Obesity Mother   . Hypertension Father   . Heart disease Father   . Hyperlipidemia Father   . Miscarriages / [place] Son   . Hypertension Brother   . Diabetes Maternal Grandmother   . Heart disease Maternal Grandfather   . Hypertension Maternal Grandfather   . Stroke Maternal Grandfather   . Heart disease Paternal Grandmother   . Hypertension Paternal Grandmother   . Stroke Paternal Grandmother   . Alzheimer's disease  Paternal Grandmother   . Hearing loss Neg Hx     Social History Social History   Tobacco Use  . Smoking status: Never Smoker  . Smokeless tobacco: Never Used  Substance Use Topics  . Alcohol use: Yes    Comment: social  . Drug use: No     Allergies   Patient has no known allergies.   Review of Systems Review of Systems  Constitutional: Negative for chills, diaphoresis and fever.  HENT: Negative for rhinorrhea and sore throat.   Eyes: Negative for pain and visual disturbance.  Respiratory: Negative for cough and shortness of breath.   Cardiovascular: Positive for chest pain. Negative for palpitations.  Gastrointestinal: Negative for abdominal pain, diarrhea, nausea and vomiting.  Genitourinary: Negative for dysuria and hematuria.  Musculoskeletal: Negative for arthralgias and back pain.  Skin: Negative for color change and rash.  Neurological: Negative for seizures and syncope.  All other systems reviewed and are negative.    Physical Exam Updated Vital Signs BP 131/80 (BP Location: Right Arm)   Pulse 73   Temp 98.9 F (37.2 C) (Oral)   Resp 20   SpO2 100%   Physical Exam  Constitutional: She is oriented to person, place, and time. She appears well-developed and well-nourished. She does not appear ill. No distress.  HENT:  Head: Normocephalic and atraumatic.  Eyes: Pupils are equal, round, and reactive to light. Conjunctivae and EOM are normal.  Neck: Normal range of motion. Neck supple.  Cardiovascular: Normal rate, regular rhythm and normal pulses.  No murmur heard. Pulmonary/Chest: Effort normal and breath sounds normal. No respiratory distress.  Abdominal: Soft. She exhibits no distension. There is no tenderness. There is no guarding.  Musculoskeletal: Normal range of motion. She exhibits no edema.  Neurological: She is alert and oriented to person, place, and time.  Skin: Skin is warm and dry. Capillary refill takes less than 2 seconds.  Psychiatric: She  has a normal mood and affect.  Nursing note and vitals reviewed.    ED Treatments / Results  Labs (all labs ordered are listed, but only abnormal results are displayed) Labs Reviewed  BASIC METABOLIC PANEL - Abnormal; Notable for the following components:      Result Value   Calcium 8.8 (*)    All other components within normal limits  CBC  TROPONIN I  I-STAT TROPONIN, ED  I-STAT BETA HCG BLOOD, ED ([facility], [facility], [facility] ONLY)    EKG EKG Interpretation  Date/Time:  Thursday August 11 2018 08:09:14 EDT Ventricular Rate:  79 PR Interval:  120 QRS Duration: 82 QT  Interval:  360 QTC Calculation: 412 R Axis:   69 Text Interpretation:  Normal sinus rhythm Septal infarct , age undetermined Abnormal ECG No old tracing to compare Confirmed by Rolan Bucco [phone]) on 08/11/2018 8:22:21 AM   Radiology Dg Chest 2 View  Result Date: 08/11/2018 CLINICAL DATA:  Chest pain and chest tightness beginning this morning. History of hypertension, diabetes, asthma, and palpitations. EXAM: CHEST - 2 VIEW COMPARISON:  None. FINDINGS: The lungs are well-expanded. There is no focal infiltrate. There is no pleural effusion. The heart and pulmonary vascularity are normal. The mediastinum is normal in width. The bony thorax exhibits no acute abnormality. IMPRESSION: There is no active cardiopulmonary disease. Electronically Signed   By: David  [place] M.D.   On: 08/11/2018 09:21    Procedures Procedures (including critical care time)  Medications Ordered in ED Medications  ibuprofen (ADVIL,MOTRIN) tablet 600 mg (600 mg Oral Given 08/11/18 0950)     Initial Impression / Assessment and Plan / ED Course  I have reviewed the triage vital signs and the nursing notes.  Pertinent labs & imaging results that were available during my care of the patient were reviewed by me and considered in my medical decision making (see chart for details).     Patient is a 32 year old female with PMHx of asthma, HTN, DM, and  anxiety who presents with central sharp pleuritic constant chest pain x this morning.  Of note patient recently started an exercise regiment over the last week.  On arrival HDS. Exam as above unremarkable.   EKG shows NSR with a rate of 79bpm and no evidence of acute ischemic changes, abnormal intervals, or dysrhythmia. Isolated TWI in V2 otherwise no change from prior on 12/07/17.  CXR without acute cardiopulmonary process.  Labs obtained and unremarkable.  No gross metabolic or infectious abnormalities. Initial troponin negative, delta pending.  Etiology likely 2/2 MSK vs costochondritis in setting of new exercise regiment.  Doubt ACS as EKG without acute ischemia, low HEAR score, initial troponin negative, and CP is pleuritic/reproducible.  Unlikely pneumonia, no cough, no leukocytosis, no fevers, CXR and exam without acute findings. Unlikely pneumothorax, no findings on CXR. Unlikely pericarditis/myocarditis, does not fit clinical picture, chest pain not exertional, no EKG findings to support.  Unlikely dissection as no ripping tearing pain, no neck pain, no pulse deficit, and no concerning FHx.  Unlikely PE as PERC negative.  Doubt acute infectious process.  Doubt acute intracranial abnormality as no reported trauma, no anticoagulation use, and patient with nonfocal neuro exam.  Awaiting delta troponin however patient requesting to leave as she has an appointment with her son.  Shared decision making was had and patient was aware of the risks of leaving prior to troponin resulting.  She verbalized her understanding.  I continue to have low suspicion for ACS. Stable for d/c home.  08/12/18 1030am - Labs reviewed and delta troponin negative.   Old records reviewed.  Imaging and labs reviewed and interpreted by myself and attending and used in the MDM.  Addressed patient question and concerns.  Reviewed discharged instructions with strict precautions given.  Advised patient to schedule follow-up with  primary care provider.  Patient verbalized understanding and agrees with plan.  Patient stable at discharge.  The plan for this patient was discussed with Dr. Fredderick Phenix who voiced agreement and who oversaw evaluation and treatment of this patient.  Final Clinical Impressions(s) / ED Diagnoses   Final diagnoses:  Atypical chest pain    ED Discharge  Orders    None       Abelardo Diesel, MD 08/12/18 1041    Rolan Bucco, MD 08/12/18 1114

## 2018-08-11 NOTE — ED Triage Notes (Signed)
Pt states she woke up this morning feeling like someone punched her in the chest. She states pain is worse with inspiration. MD reported pt was diaphoretic.

## 2018-08-11 NOTE — ED Notes (Signed)
Pt needs to leave to take her son to [facility] at 2 pm. Troponin has not resulted at this time. CP is better 2/10.

## 2018-08-11 NOTE — ED Notes (Signed)
Patient transported to X-ray 

## 2018-08-11 NOTE — Progress Notes (Signed)
Office: [phone]  /  Fax: [phone]   HPI:   Chief Complaint: OBESITY Raven Walters is here to discuss her progress with her obesity treatment plan. She is on the portion control better and make smarter food choices, such as increase vegetables and decrease simple carbohydrates and is following her eating plan approximately 70 % of the time. She states she is walking 2-3 miles 7 times per week. I did not discuss obesity with Raven Walters at this visit due to chest pain. She is to return to our clinic in 2 weeks to discuss obesity at that time.  Her weight is 229 lb (103.9 kg) today and has gained 6 pounds since her last visit. She has lost 0 lbs since starting treatment with [place].  Chest Pain Elie complains of chest pain in our office today. She notes sweating and shortness of breath. She has no history of myocardial infarction, and she notes pain is consistent and radiating to her back. She denies GI upset or GERD symptoms. EKG shows within normal limits.  Hypertension [name] is a 32 y.o. female with hypertension. Raven Walters is taking labetalol and she denies missing her medication. She notes chest pain and shortness of breath. She is working weight loss to help control her blood pressure with the goal of decreasing her risk of heart attack and stroke. Raven Walters's blood pressure is currently controlled.  ALLERGIES: No Known Allergies  MEDICATIONS: Current Outpatient Medications on File Prior to Visit  Medication Sig Dispense Refill  . buPROPion (WELLBUTRIN SR) 150 MG 12 hr tablet Take 1 tablet (150 mg total) by mouth 2 (two) times daily. (Patient not taking: Reported on 08/11/2018) 180 tablet 0  . escitalopram (LEXAPRO) 10 MG tablet TAKE 1/2 TABLET FOR 3 DAYS THEN INCREASE TO ONE TABLET BY MOUTH DAILY (Patient not taking: Reported on 08/11/2018) 90 tablet 3  . fluticasone (FLONASE) 50 MCG/ACT nasal spray Place 2 sprays into both nostrils daily. (Patient not taking: Reported on 08/11/2018) 16 g 6   . labetalol (NORMODYNE) 100 MG tablet Take 1 tablet (100 mg total) by mouth 2 (two) times daily. 180 tablet 1  . metFORMIN (GLUCOPHAGE) 500 MG tablet Take 1 tablet (500 mg total) by mouth 2 (two) times daily with a meal. 180 tablet 2  . Vitamin D, Ergocalciferol, (DRISDOL) 50000 units CAPS capsule Take 1 capsule (50,000 Units total) by mouth every 7 (seven) days. 4 capsule 0   No current facility-administered medications on file prior to visit.     PAST MEDICAL HISTORY: Past Medical History:  Diagnosis Date  . Anxiety   . Asthma    pt states it is induced by excercise  . Chest pain 02/07/2017  . Dyspnea   . Essential hypertension 02/07/2017  . Headache   . Hx MRSA infection   . Infection    UTI  . Palpitations 02/07/2017  . Preeclampsia   . PVC (premature ventricular contraction) 03/18/2017    PAST SURGICAL HISTORY: Past Surgical History:  Procedure Laterality Date  . NO PAST SURGERIES      SOCIAL HISTORY: Social History   Tobacco Use  . Smoking status: Never Smoker  . Smokeless tobacco: Never Used  Substance Use Topics  . Alcohol use: Yes    Comment: social  . Drug use: No    FAMILY HISTORY: Family History  Problem Relation Age of Onset  . Hypertension Mother   . Cardiomyopathy Mother   . Heart disease Mother   . Anxiety disorder Mother   .  Obesity Mother   . Hypertension Father   . Heart disease Father   . Hyperlipidemia Father   . Miscarriages / [place] Son   . Hypertension Brother   . Diabetes Maternal Grandmother   . Heart disease Maternal Grandfather   . Hypertension Maternal Grandfather   . Stroke Maternal Grandfather   . Heart disease Paternal Grandmother   . Hypertension Paternal Grandmother   . Stroke Paternal Grandmother   . Alzheimer's disease Paternal Grandmother   . Hearing loss Neg Hx     ROS: Review of Systems  Constitutional: Negative for weight loss.  Respiratory: Positive for shortness of breath.   Cardiovascular: Positive for  chest pain.  Skin:       + Sweating    PHYSICAL EXAM: Blood pressure 118/80, pulse 77, temperature 98.4 F (36.9 C), temperature source Oral, height 5\' 7"  (1.702 m), weight 229 lb (103.9 kg), SpO2 99 %. Body mass index is 35.87 kg/m. Physical Exam  Constitutional: She is oriented to person, place, and time. She appears well-developed and well-nourished.  Cardiovascular: Normal rate.  Pulmonary/Chest: Effort normal.  Musculoskeletal: Normal range of motion.  Neurological: She is oriented to person, place, and time.  Skin: Skin is warm and dry.  Psychiatric: She has a normal mood and affect. Her behavior is normal.  Vitals reviewed.   RECENT LABS AND TESTS: BMET    Component Value Date/Time   NA 137 08/11/2018 0811   NA 137 12/07/2017 1125   K 4.1 08/11/2018 0811   CL 105 08/11/2018 0811   CO2 24 08/11/2018 0811   GLUCOSE 95 08/11/2018 0811   BUN 10 08/11/2018 0811   BUN 10 12/07/2017 1125   CREATININE 0.81 08/11/2018 0811   CALCIUM 8.8 (L) 08/11/2018 0811   GFRNONAA >60 08/11/2018 0811   GFRAA >60 08/11/2018 0811   Lab Results  Component Value Date   HGBA1C 5.4 12/07/2017   Lab Results  Component Value Date   INSULIN 13.7 12/07/2017   CBC    Component Value Date/Time   WBC 9.5 08/11/2018 0811   RBC 4.16 08/11/2018 0811   HGB 12.4 08/11/2018 0811   HGB 12.9 12/07/2017 1125   HCT 38.5 08/11/2018 0811   HCT 37.7 12/07/2017 1125   PLT 247 08/11/2018 0811   PLT 251 01/13/2017 1604   MCV 92.5 08/11/2018 0811   MCV 88 12/07/2017 1125   MCH 29.8 08/11/2018 0811   MCHC 32.2 08/11/2018 0811   RDW 13.5 08/11/2018 0811   RDW 14.3 12/07/2017 1125   LYMPHSABS 3.0 12/07/2017 1125   EOSABS 0.2 12/07/2017 1125   BASOSABS 0.0 12/07/2017 1125   Iron/TIBC/Ferritin/ %Sat No results found for: IRON, TIBC, FERRITIN, IRONPCTSAT Lipid Panel     Component Value Date/Time   CHOL 164 12/07/2017 1125   TRIG 89 12/07/2017 1125   HDL 43 12/07/2017 1125   CHOLHDL 3.4  01/13/2017 1604   LDLCALC 103 (H) 12/07/2017 1125   Hepatic Function Panel     Component Value Date/Time   PROT 7.8 12/07/2017 1125   ALBUMIN 4.2 12/07/2017 1125   AST 15 12/07/2017 1125   ALT 15 12/07/2017 1125   ALKPHOS 61 12/07/2017 1125   BILITOT 0.5 12/07/2017 1125      Component Value Date/Time   TSH 0.804 12/07/2017 1125   TSH 1.090 01/13/2017 1604    ASSESSMENT AND PLAN: Other chest pain - Plan: EKG 12-Lead  PLAN:  Chest Pain Coretha was sent to [facility] for evaluation, and report  was called in to the emergency room. Elany is to call our clinic to schedule a follow up visit in 2 weeks.  Hypertension We discussed sodium restriction, working on healthy weight loss, and a regular exercise program as the means to achieve improved blood pressure control. Kayln agreed with this plan and agreed to follow up as directed. We will continue to monitor her blood pressure as well as her progress with the above lifestyle modifications. She will continue her anti-hypertensive medications and will watch for signs of hypotension as she continues her lifestyle modifications. Jourdan is to call our clinic to schedule a follow up visit in 2 weeks.  Obesity Giordana is currently in the action stage of change. As such, her goal is to continue with weight loss efforts She has agreed to (did not get to discuss a meal plan) Gerardine has been instructed to work up to a goal of 150 minutes of combined cardio and strengthening exercise per week for weight loss and overall health benefits. We discussed the following Behavioral Modification Strategies today: (we did not get to discuss behavioral modifications)   Akeira has agreed to call for a follow up with our clinic in 2 weeks. She was informed of the importance of frequent follow up visits to maximize her success with intensive lifestyle modifications for her multiple health conditions.   OBESITY BEHAVIORAL INTERVENTION VISIT  Today's visit  was # 8   Starting weight: 220 lbs Starting date: 12/07/17 Today's weight : 229 lbs  Today's date: 08/10/2018 Total lbs lost to date: 0 At least 15 minutes were spent on discussing the following behavioral intervention visit.   ASK: We discussed the diagnosis of obesity with Cristino Martes today and Jaylinn agreed to give [place] permission to discuss obesity behavioral modification therapy today.  ASSESS: Jonnae has the diagnosis of obesity and her BMI today is 35.86 Etana is in the action stage of change   ADVISE: Stachia was educated on the multiple health risks of obesity as well as the benefit of weight loss to improve her health. She was advised of the need for long term treatment and the importance of lifestyle modifications to improve her current health and to decrease her risk of future health problems.  AGREE: Multiple dietary modification options and treatment options were discussed and  Dashanti agreed to follow the recommendations documented in the above note.  ARRANGE: Kahlie was educated on the importance of frequent visits to treat obesity as outlined per CMS and USPSTF guidelines and agreed to schedule her next follow up appointment today.  Trude Mcburney, am acting as transcriptionist for [employer], DO  I have reviewed the above documentation for accuracy and completeness, and I agree with the above. -Corinna Capra, DO

## 2018-08-15 ENCOUNTER — Telehealth (INDEPENDENT_AMBULATORY_CARE_PROVIDER_SITE_OTHER): Payer: Self-pay | Admitting: Family Medicine

## 2018-08-15 ENCOUNTER — Ambulatory Visit (INDEPENDENT_AMBULATORY_CARE_PROVIDER_SITE_OTHER): Payer: BLUE CROSS/BLUE SHIELD | Admitting: Family Medicine

## 2018-08-15 VITALS — BP 128/85 | HR 81 | Temp 98.1°F | Ht 67.0 in | Wt 229.0 lb

## 2018-08-15 DIAGNOSIS — I10 Essential (primary) hypertension: Secondary | ICD-10-CM

## 2018-08-15 DIAGNOSIS — F3289 Other specified depressive episodes: Secondary | ICD-10-CM

## 2018-08-15 DIAGNOSIS — E559 Vitamin D deficiency, unspecified: Secondary | ICD-10-CM | POA: Diagnosis not present

## 2018-08-15 DIAGNOSIS — Z9189 Other specified personal risk factors, not elsewhere classified: Secondary | ICD-10-CM | POA: Diagnosis not present

## 2018-08-15 DIAGNOSIS — E8881 Metabolic syndrome: Secondary | ICD-10-CM

## 2018-08-15 DIAGNOSIS — Z6835 Body mass index (BMI) 35.0-35.9, adult: Secondary | ICD-10-CM

## 2018-08-15 MED ORDER — VITAMIN D (ERGOCALCIFEROL) 1.25 MG (50000 UNIT) PO CAPS: 50000.0000 [IU] | ORAL_CAPSULE | ORAL | 0 refills | Status: DC

## 2018-08-15 MED ORDER — TOPIRAMATE 50 MG PO TABS: 50.0000 mg | ORAL_TABLET | Freq: Every day | ORAL | 0 refills | Status: DC

## 2018-08-15 NOTE — Telephone Encounter (Signed)
Patient states that her pharmacy, [facility] on [address],  is out of (1) of the prescriptions Dr. Dalbert Garnet ordered for her.  They have the Vit D but not the other.  Please advise. Thank you. Selena Batten

## 2018-08-15 NOTE — Telephone Encounter (Signed)
Patient's prescription sent in to the pharmacy. Danija Gosa, CMA

## 2018-08-15 NOTE — Progress Notes (Signed)
Office: [phone]  /  Fax: [phone]   HPI:   Chief Complaint: OBESITY Raven Walters is here to discuss her progress with her obesity treatment plan. She is on the Category 3 plan and is following her eating plan approximately 80 % of the time. She states she is walking 60 minutes 3 times per week. Raven Walters has been off track with her diet and struggling with food cravings. She is training to walk in a 5K. She is interested in phentermine. She likes the Category 3 plan. She has recently started a new job and was unable to follow up regularly with our clinic.  Her weight is 229 lb (103.9 kg) today and has not lost weight since her last visit. She has lost 0 lbs since starting treatment with [place].  Depression with emotional eating behaviors Raven Walters is struggling with emotional eating and using food for comfort to the extent that it is negatively impacting her health. She is having food cravings. Bupropion is causing nausea. She will discontinue this medication. She has an IUD and no history of nephrolithiasis.  Hypertension Raven Walters is a 32 y.o. female with hypertension. She is working weight loss to help control her blood pressure with the goal of decreasing her risk of heart attack and stroke. Raven Walters's blood pressure is stable on labetolol. She had a recent visit to the ED for chest pain. It was determined to be musculoskeletal.  Vitamin D deficiency Raven Walters has a diagnosis of vitamin D deficiency. She is currently taking vit D and is stable.   Insulin Resistance Raven Walters has a diagnosis of insulin resistance based on her elevated fasting insulin level >5. Although Raven Walters's blood glucose readings are still under good control, insulin resistance puts her at greater risk of metabolic syndrome and diabetes. Her fasting Insulin has not been checked since 12/07/17. She reports food cravings. She is taking metformin currently and continues to work on diet and exercise to decrease risk of  diabetes.  At risk for diabetes Raven Walters is at higher than average risk for developing diabetes due to her insulin resistance and obesity.   ALLERGIES: No Known Allergies  MEDICATIONS: Current Outpatient Medications on File Prior to Visit  Medication Sig Dispense Refill  . buPROPion (WELLBUTRIN SR) 150 MG 12 hr tablet Take 1 tablet (150 mg total) by mouth 2 (two) times daily. 180 tablet 0  . escitalopram (LEXAPRO) 10 MG tablet TAKE 1/2 TABLET FOR 3 DAYS THEN INCREASE TO ONE TABLET BY MOUTH DAILY 90 tablet 3  . fluticasone (FLONASE) 50 MCG/ACT nasal spray Place 2 sprays into both nostrils daily. 16 g 6  . labetalol (NORMODYNE) 100 MG tablet Take 1 tablet (100 mg total) by mouth 2 (two) times daily. 180 tablet 1  . levonorgestrel (MIRENA, 52 MG,) 20 MCG/24HR IUD 1 Device by Intrauterine route once. [id number]    . metFORMIN (GLUCOPHAGE) 500 MG tablet Take 1 tablet (500 mg total) by mouth 2 (two) times daily with a meal. 180 tablet 2   No current facility-administered medications on file prior to visit.     PAST MEDICAL HISTORY: Past Medical History:  Diagnosis Date  . Anxiety   . Asthma    pt states it is induced by excercise  . Chest pain 02/07/2017  . Dyspnea   . Essential hypertension 02/07/2017  . Headache   . Hx MRSA infection   . Infection    UTI  . Palpitations 02/07/2017  . Preeclampsia   . PVC (premature ventricular contraction) 03/18/2017  PAST SURGICAL HISTORY: Past Surgical History:  Procedure Laterality Date  . NO PAST SURGERIES      SOCIAL HISTORY: Social History   Tobacco Use  . Smoking status: Never Smoker  . Smokeless tobacco: Never Used  Substance Use Topics  . Alcohol use: Yes    Comment: social  . Drug use: No    FAMILY HISTORY: Family History  Problem Relation Age of Onset  . Hypertension Mother   . Cardiomyopathy Mother   . Heart disease Mother   . Anxiety disorder Mother   . Obesity Mother   . Hypertension Father   . Heart disease Father    . Hyperlipidemia Father   . Miscarriages / [place] Son   . Hypertension Brother   . Diabetes Maternal Grandmother   . Heart disease Maternal Grandfather   . Hypertension Maternal Grandfather   . Stroke Maternal Grandfather   . Heart disease Paternal Grandmother   . Hypertension Paternal Grandmother   . Stroke Paternal Grandmother   . Alzheimer's disease Paternal Grandmother   . Hearing loss Neg Hx     ROS: Review of Systems  Constitutional: Negative for weight loss.  Genitourinary:       Negative for nephrolithiasis.  Psychiatric/Behavioral: Positive for depression.    PHYSICAL EXAM: Blood pressure 128/85, pulse 81, temperature 98.1 F (36.7 C), temperature source Oral, height 5\' 7"  (1.702 m), weight 229 lb (103.9 kg), SpO2 98 %. Body mass index is 35.87 kg/m. Physical Exam  Constitutional: She is oriented to person, place, and time. She appears well-developed and well-nourished.  Cardiovascular: Normal rate.  Pulmonary/Chest: Effort normal.  Musculoskeletal: Normal range of motion.  Neurological: She is oriented to person, place, and time.  Skin: Skin is warm and dry.  Psychiatric: She has a normal mood and affect. Her behavior is normal.  Vitals reviewed.   RECENT LABS AND TESTS: BMET    Component Value Date/Time   NA 137 08/11/2018 0811   NA 137 12/07/2017 1125   K 4.1 08/11/2018 0811   CL 105 08/11/2018 0811   CO2 24 08/11/2018 0811   GLUCOSE 95 08/11/2018 0811   BUN 10 08/11/2018 0811   BUN 10 12/07/2017 1125   CREATININE 0.81 08/11/2018 0811   CALCIUM 8.8 (L) 08/11/2018 0811   GFRNONAA >60 08/11/2018 0811   GFRAA >60 08/11/2018 0811   Lab Results  Component Value Date   HGBA1C 5.4 12/07/2017   Lab Results  Component Value Date   INSULIN 13.7 12/07/2017   CBC    Component Value Date/Time   WBC 9.5 08/11/2018 0811   RBC 4.16 08/11/2018 0811   HGB 12.4 08/11/2018 0811   HGB 12.9 12/07/2017 1125   HCT 38.5 08/11/2018 0811   HCT 37.7  12/07/2017 1125   PLT 247 08/11/2018 0811   PLT 251 01/13/2017 1604   MCV 92.5 08/11/2018 0811   MCV 88 12/07/2017 1125   MCH 29.8 08/11/2018 0811   MCHC 32.2 08/11/2018 0811   RDW 13.5 08/11/2018 0811   RDW 14.3 12/07/2017 1125   LYMPHSABS 3.0 12/07/2017 1125   EOSABS 0.2 12/07/2017 1125   BASOSABS 0.0 12/07/2017 1125   Iron/TIBC/Ferritin/ %Sat No results found for: IRON, TIBC, FERRITIN, IRONPCTSAT Lipid Panel     Component Value Date/Time   CHOL 164 12/07/2017 1125   TRIG 89 12/07/2017 1125   HDL 43 12/07/2017 1125   CHOLHDL 3.4 01/13/2017 1604   LDLCALC 103 (H) 12/07/2017 1125   Hepatic Function Panel  Component Value Date/Time   PROT 7.8 12/07/2017 1125   ALBUMIN 4.2 12/07/2017 1125   AST 15 12/07/2017 1125   ALT 15 12/07/2017 1125   ALKPHOS 61 12/07/2017 1125   BILITOT 0.5 12/07/2017 1125      Component Value Date/Time   TSH 0.804 12/07/2017 1125   TSH 1.090 01/13/2017 1604    Results for Sestak, Markeria N "Taffy Cassaro" (MRN [id number]) as of 08/15/2018 13:13  Ref. Range 12/07/2017 11:25  Vitamin D, 25-Hydroxy Latest Ref Range: 30.0 - 100.0 ng/mL 15.6 (L)   ASSESSMENT AND PLAN: Essential hypertension  Vitamin D deficiency - Plan: Vitamin D, Ergocalciferol, (DRISDOL) 50000 units CAPS capsule  Insulin resistance  Other depression - with emotional eating  At risk for diabetes mellitus  Class 2 severe obesity with serious comorbidity and body mass index (BMI) of 35.0 to 35.9 in adult, unspecified obesity type (HCC)  PLAN:  Hypertension We discussed sodium restriction, working on healthy weight loss, and a regular exercise program as the means to achieve improved blood pressure control.  We will continue to monitor her blood pressure as well as her progress with the above lifestyle modifications. She will continue her medications as prescribed and will watch for signs of hypotension as she continues her lifestyle modifications. Raven Walters agrees to continue  labetalol 100mg  BID # 60 with no refills. Raven Walters agreed with this plan and agreed to follow up as directed in 2 weeks.  Vitamin D Deficiency Raven Walters was informed that low vitamin D levels contributes to fatigue and are associated with obesity, breast, and colon cancer. She agrees to continue to take prescription Vit D @50 ,000 IU every week #4 with no refills and will follow up for routine testing of vitamin D, at least 2-3 times per year. She was informed of the risk of over-replacement of vitamin D and agrees to not increase her dose unless she discusses this with [place] first. We will check labs at her next visit. Raven Walters agrees to follow up in 2 weeks.  Insulin Resistance Raven Walters will continue to work on weight loss, exercise, and decreasing simple carbohydrates in her diet to help decrease the risk of diabetes.  She was informed that eating too many simple carbohydrates or too many calories at one sitting increases the likelihood of GI side effects. Raven Walters agrees to continue her diet and metformin 500mg  BID # 60 with no refills and prescription was written today. She was encouraged to eat all of her food on the plan. We will check labs at her next visit. Raven Walters agreed to follow up with [place] as directed to monitor her progress in 2 weeks.  Diabetes risk counseling Raven Walters was given extended (15 minutes) diabetes prevention counseling today. She is 32 y.o. female and has risk factors for diabetes including insulin resistance and obesity. We discussed intensive lifestyle modifications today with an emphasis on weight loss as well as increasing exercise and decreasing simple carbohydrates in her diet.  Depression with Emotional Eating Behaviors We discussed behavior modification techniques today to help Raven Walters deal with her emotional eating and depression. I advised that phentermine was not a good choice for her because she needs to eat all the food on her plan. She has agreed to start to take Topiramate 50mg  at  bedtime (she will take 25mg  and then increase to 50mg  if tolerating well). She agreed to follow up as directed.  Obesity Raven Walters is currently in the action stage of change. As such, her goal is to continue with weight  loss efforts. She has agreed to follow the Category 3 plan.  Raven Walters has been instructed to walk 3 times a week for 1 hour and work up to a goal of 150 minutes of combined cardio and strengthening exercise per week for weight loss and overall health benefits. We discussed the following Behavioral Modification Strategies today: increasing lean protein intake, decreasing simple carbohydrates, increasing vegetables, increase H2O intake, emotional eating strategies, and ways to avoid night time snacking.  Raven Walters has agreed to follow up with our clinic in 2 weeks. She was informed of the importance of frequent follow up visits to maximize her success with intensive lifestyle modifications for her multiple health conditions.   OBESITY BEHAVIORAL INTERVENTION VISIT  Today's visit was # 9   Starting weight: 220 lbs Starting date: 12/07/17 Today's weight : Weight: 229 lb (103.9 kg)  Today's date: 08/15/2018 Total lbs lost to date: 0  ASK: We discussed the diagnosis of obesity with Raven Walters today and Raven Walters agreed to give [place] permission to discuss obesity behavioral modification therapy today.  ASSESS: Raven Walters has the diagnosis of obesity and her BMI today is 35.86. Raven Walters is in the action stage of change.   ADVISE: Kamaree was educated on the multiple health risks of obesity as well as the benefit of weight loss to improve her health. She was advised of the need for long term treatment and the importance of lifestyle modifications to improve her current health and to decrease her risk of future health problems.  AGREE: Multiple dietary modification options and treatment options were discussed and Jhoselin agreed to follow the recommendations documented in the above  note.  ARRANGE: Teigen was educated on the importance of frequent visits to treat obesity as outlined per CMS and USPSTF guidelines and agreed to schedule her next follow up appointment today.  I, Kirke Corin, am acting as transcriptionist for Wilder Glade, MD  I have reviewed the above documentation for accuracy and completeness, and I agree with the above. -Quillian Quince, MD

## 2018-08-29 ENCOUNTER — Ambulatory Visit (INDEPENDENT_AMBULATORY_CARE_PROVIDER_SITE_OTHER): Payer: BLUE CROSS/BLUE SHIELD | Admitting: Family Medicine

## 2018-08-29 ENCOUNTER — Encounter (INDEPENDENT_AMBULATORY_CARE_PROVIDER_SITE_OTHER): Payer: Self-pay | Admitting: Family Medicine

## 2018-08-29 VITALS — BP 138/92 | HR 83 | Temp 98.2°F | Ht 67.0 in | Wt 218.0 lb

## 2018-08-29 DIAGNOSIS — Z6834 Body mass index (BMI) 34.0-34.9, adult: Secondary | ICD-10-CM

## 2018-08-29 DIAGNOSIS — E559 Vitamin D deficiency, unspecified: Secondary | ICD-10-CM | POA: Diagnosis not present

## 2018-08-29 DIAGNOSIS — E8881 Metabolic syndrome: Secondary | ICD-10-CM | POA: Diagnosis not present

## 2018-08-29 DIAGNOSIS — Z9189 Other specified personal risk factors, not elsewhere classified: Secondary | ICD-10-CM | POA: Diagnosis not present

## 2018-08-29 DIAGNOSIS — E66811 Obesity, class 1: Secondary | ICD-10-CM

## 2018-08-29 DIAGNOSIS — E669 Obesity, unspecified: Secondary | ICD-10-CM

## 2018-08-29 DIAGNOSIS — E88819 Insulin resistance, unspecified: Secondary | ICD-10-CM

## 2018-08-29 DIAGNOSIS — F3289 Other specified depressive episodes: Secondary | ICD-10-CM

## 2018-08-29 DIAGNOSIS — K5903 Drug induced constipation: Secondary | ICD-10-CM

## 2018-08-29 NOTE — Progress Notes (Addendum)
Office: [phone]  /  Fax: [phone]   HPI:   Chief Complaint: OBESITY Raven Walters is here to discuss her progress with her obesity treatment plan. She is on the  follow the Category 3 plan and is following her eating plan approximately 85 % of the time. She states she is walking 30-45 minutes 3 times per week. Raven Walters is doing well with the category 3 plan and reports eating all of the food on the plan. She will be attending the [employer] next week and plans on making healthy choices.  Her weight is 218 lb (98.9 kg) today and has had a weight loss of 11 pounds over a period of 2 weeks since her last visit. She has lost 2 lbs since starting treatment with [place].  Insulin Resistance Raven Walters has a diagnosis of insulin resistance based on her elevated fasting insulin level 13.7 in January of 2019. Although Whitleigh's blood glucose readings are still under good control, insulin resistance puts her at greater risk of metabolic syndrome and diabetes. She is taking metformin currently and continues to work on diet and exercise to decrease risk of diabetes. She denies nausea, vomiting, and diarrhea.   Vitamin D deficiency Raven Walters has a diagnosis of vitamin D deficiency. She is currently taking vit D and denies nausea, vomiting or muscle weakness.  Depression with emotional eating behaviors Raven Walters is struggling with emotional eating and using food for comfort to the extent that it is negatively impacting her health. She often snacks when she is not hungry. Raven Walters sometimes feels she is out of control and then feels guilty that she made poor food choices. She has been working on behavior modification techniques to help reduce her emotional eating and has been very successful. She shows no sign of suicidal or homicidal ideations. She was prescribed topiramate 50 mg at bedtime at her most recent visit and she has been tolerating this well. Denies dysgeusia or mental fogginess. She feels this has reduced her  food cravings.  Depression screen [facility] 2/9 06/22/2018 02/16/2018 12/07/2017 06/25/2017 06/18/2017  Decreased Interest 0 0 3 0 0  Down, Depressed, Hopeless 0 0 1 0 0  PHQ - 2 Score 0 0 4 0 0  Altered sleeping - - 3 - -  Tired, decreased energy - - 3 - -  Change in appetite - - 3 - -  Feeling bad or failure about yourself  - - 2 - -  Trouble concentrating - - 3 - -  Moving slowly or fidgety/restless - - 1 - -  Suicidal thoughts - - 0 - -  PHQ-9 Score - - 19 - -  Difficult doing work/chores - - Not difficult at all - -     Constipation Raven Walters notes constipation for the last few weeks. She believes it may be related to the metformin. She states BM are less frequent and are not hard and painful. She denies drinking less H20 recently.   ALLERGIES: No Known Allergies  MEDICATIONS: Current Outpatient Medications on File Prior to Visit  Medication Sig Dispense Refill  . escitalopram (LEXAPRO) 10 MG tablet TAKE 1/2 TABLET FOR 3 DAYS THEN INCREASE TO ONE TABLET BY MOUTH DAILY 90 tablet 3  . fluticasone (FLONASE) 50 MCG/ACT nasal spray Place 2 sprays into both nostrils daily. 16 g 6  . labetalol (NORMODYNE) 100 MG tablet Take 1 tablet (100 mg total) by mouth 2 (two) times daily. 180 tablet 1  . levonorgestrel (MIRENA, 52 MG,) 20 MCG/24HR IUD 1 Device  by Intrauterine route once. [phone]    . metFORMIN (GLUCOPHAGE) 500 MG tablet Take 1 tablet (500 mg total) by mouth 2 (two) times daily with a meal. 180 tablet 2  . topiramate (TOPAMAX) 50 MG tablet Take 1 tablet (50 mg total) by mouth daily. 30 tablet 0  . Vitamin D, Ergocalciferol, (DRISDOL) 50000 units CAPS capsule Take 1 capsule (50,000 Units total) by mouth every 7 (seven) days. 4 capsule 0   No current facility-administered medications on file prior to visit.     PAST MEDICAL HISTORY: Past Medical History:  Diagnosis Date  . Anxiety   . Asthma    pt states it is induced by excercise  . Chest pain 02/07/2017  . Dyspnea   . Essential  hypertension 02/07/2017  . Headache   . Hx MRSA infection   . Infection    UTI  . Palpitations 02/07/2017  . Preeclampsia   . PVC (premature ventricular contraction) 03/18/2017    PAST SURGICAL HISTORY: Past Surgical History:  Procedure Laterality Date  . NO PAST SURGERIES      SOCIAL HISTORY: Social History   Tobacco Use  . Smoking status: Never Smoker  . Smokeless tobacco: Never Used  Substance Use Topics  . Alcohol use: Yes    Comment: social  . Drug use: No    FAMILY HISTORY: Family History  Problem Relation Age of Onset  . Hypertension Mother   . Cardiomyopathy Mother   . Heart disease Mother   . Anxiety disorder Mother   . Obesity Mother   . Hypertension Father   . Heart disease Father   . Hyperlipidemia Father   . Miscarriages / [place] Son   . Hypertension Brother   . Diabetes Maternal Grandmother   . Heart disease Maternal Grandfather   . Hypertension Maternal Grandfather   . Stroke Maternal Grandfather   . Heart disease Paternal Grandmother   . Hypertension Paternal Grandmother   . Stroke Paternal Grandmother   . Alzheimer's disease Paternal Grandmother   . Hearing loss Neg Hx     ROS: Review of Systems  Gastrointestinal: Positive for constipation. Negative for diarrhea, nausea and vomiting.  Musculoskeletal:       Denies muscle weakness.  Neurological: Negative for weakness.       Denies dysgeusia or mental fogginess.    PHYSICAL EXAM: Blood pressure (!) 138/92, pulse 83, temperature 98.2 F (36.8 C), height 5\' 7"  (1.702 m), weight 218 lb (98.9 kg), SpO2 98 %. Body mass index is 34.14 kg/m. Physical Exam  Constitutional: She is oriented to person, place, and time. She appears well-developed and well-nourished.  Eyes: EOM are normal. No scleral icterus.  Neck: Normal range of motion.  Cardiovascular: Normal rate.  Pulmonary/Chest: Effort normal.  Musculoskeletal: Normal range of motion.  Neurological: She is oriented to person, place,  and time.  Skin: Skin is warm and dry.  Psychiatric: She has a normal mood and affect. Her behavior is normal.  Vitals reviewed.   RECENT LABS AND TESTS: BMET    Component Value Date/Time   NA 137 08/11/2018 0811   NA 137 12/07/2017 1125   K 4.1 08/11/2018 0811   CL 105 08/11/2018 0811   CO2 24 08/11/2018 0811   GLUCOSE 95 08/11/2018 0811   BUN 10 08/11/2018 0811   BUN 10 12/07/2017 1125   CREATININE 0.81 08/11/2018 0811   CALCIUM 8.8 (L) 08/11/2018 0811   GFRNONAA >60 08/11/2018 0811   GFRAA >60 08/11/2018 2841   Lab  Results  Component Value Date   HGBA1C 5.4 12/07/2017   Lab Results  Component Value Date   INSULIN 13.7 12/07/2017   CBC    Component Value Date/Time   WBC 9.5 08/11/2018 0811   RBC 4.16 08/11/2018 0811   HGB 12.4 08/11/2018 0811   HGB 12.9 12/07/2017 1125   HCT 38.5 08/11/2018 0811   HCT 37.7 12/07/2017 1125   PLT 247 08/11/2018 0811   PLT 251 01/13/2017 1604   MCV 92.5 08/11/2018 0811   MCV 88 12/07/2017 1125   MCH 29.8 08/11/2018 0811   MCHC 32.2 08/11/2018 0811   RDW 13.5 08/11/2018 0811   RDW 14.3 12/07/2017 1125   LYMPHSABS 3.0 12/07/2017 1125   EOSABS 0.2 12/07/2017 1125   BASOSABS 0.0 12/07/2017 1125   Iron/TIBC/Ferritin/ %Sat No results found for: IRON, TIBC, FERRITIN, IRONPCTSAT Lipid Panel     Component Value Date/Time   CHOL 164 12/07/2017 1125   TRIG 89 12/07/2017 1125   HDL 43 12/07/2017 1125   CHOLHDL 3.4 01/13/2017 1604   LDLCALC 103 (H) 12/07/2017 1125   Hepatic Function Panel     Component Value Date/Time   PROT 7.8 12/07/2017 1125   ALBUMIN 4.2 12/07/2017 1125   AST 15 12/07/2017 1125   ALT 15 12/07/2017 1125   ALKPHOS 61 12/07/2017 1125   BILITOT 0.5 12/07/2017 1125      Component Value Date/Time   TSH 0.804 12/07/2017 1125   TSH 1.090 01/13/2017 1604    ASSESSMENT AND PLAN: Insulin resistance - Plan: Hemoglobin A1c, Insulin, random, Lipid Panel With LDL/HDL Ratio  Vitamin D deficiency - Plan: VITAMIN  D 25 Hydroxy (Vit-D Deficiency, Fractures)  Other depression - with emotional eating  Drug-induced constipation  At risk for diabetes mellitus  Class 1 obesity with serious comorbidity and body mass index (BMI) of 34.0 to 34.9 in adult, unspecified obesity type  PLAN:  Insulin Resistance Noorah will continue to work on weight loss, exercise, and decreasing simple carbohydrates in her diet to help decrease the risk of diabetes. We dicussed metformin including benefits and risks. She was informed that eating too many simple carbohydrates or too many calories at one sitting increases the likelihood of GI side effects. Jaliana will continue metformin for now. We will recheck fasting insulin levels and Hgb A1c today.  Rockelle agreed to follow up with [place] as directed to monitor her progress.  Diabetes risk counselling Nikcole was given extended (15 minutes) diabetes prevention counseling today. She is 32 y.o. female and has risk factors for diabetes including obesity. We discussed intensive lifestyle modifications today with an emphasis on weight loss as well as increasing exercise and decreasing simple carbohydrates in her diet.   Vitamin D Deficiency Mariluz was informed that low vitamin D levels contributes to fatigue and are associated with obesity, breast, and colon cancer. She agrees to continue to take prescription Vit D @50 ,000 IU every week and will follow up for routine testing of vitamin D, at least 2-3 times per year. She was informed of the risk of over-replacement of vitamin D and agrees to not increase her dose unless she discusses this with [place] first.  Depression with Emotional Eating Behaviors We discussed behavior modification techniques today to help Elaine deal with her emotional eating and depression. She will continue to take topiramate 50 mg daily at bedtime for craving. Fenna agreed to follow up with [place] as directed to monitor her progress.  Constipation Patricia was informed  decrease bowel movement frequency  is normal while losing weight, but stools should not be hard or painful. She was advised to increase her H20 intake and begin taking an over the counter stool softener daily. Taneah agreed to follow up with [place] as directed to monitor her progress.  Obesity Shoshannah is currently in the action stage of change. As such, her goal is to continue with weight loss efforts She has agreed to follow the Category 3 plan. Korayma has been instructed to work up to a goal of 150 minutes of combined cardio and strengthening exercise per week for weight loss and overall health benefits. We discussed the following Behavioral Modification Stratagies today: holiday and celebration eating strategies, increasing water intake, and planning for success.  Corean has agreed to follow up with our clinic in 2 weeks. She was informed of the importance of frequent follow up visits to maximize her success with intensive lifestyle modifications for her multiple health conditions.   OBESITY BEHAVIORAL INTERVENTION VISIT  Today's visit was # 10  Starting weight: 220 Starting date: 12/07/17 Today's weight : Weight: 218 lb (98.9 kg)  Today's date: 08/29/2018 Total lbs lost to date: 2  ASK: We discussed the diagnosis of obesity with Cristino Martes today and Jon Gills agreed to give [place] permission to discuss obesity behavioral modification therapy today.  ASSESS: Teniola has the diagnosis of obesity and her BMI today is 34.14. Kiyoko is in the action stage of change   ADVISE: Harla was educated on the multiple health risks of obesity as well as the benefit of weight loss to improve her health. She was advised of the need for long term treatment and the importance of lifestyle modifications to improve her current health and to decrease her risk of future health problems.  AGREE: Multiple dietary modification options and treatment options were discussed and  Ronnita agreed to follow the  recommendations documented in the above note.  ARRANGE: Gissell was educated on the importance of frequent visits to treat obesity as outlined per CMS and USPSTF guidelines and agreed to schedule her next follow up appointment today.

## 2018-08-29 NOTE — Addendum Note (Signed)
Addended by: Jesse Sans on: 08/29/2018 03:33 PM   Modules accepted: Level of Service

## 2018-09-02 IMAGING — US US OB TRANSVAGINAL
1 series · 15 of 28 positions shown · non-contrast
Comparison: None for this pregnancy

CLINICAL DATA: Pregnant, bleeding

EXAM:
OBSTETRIC <14 WK US AND TRANSVAGINAL OB US
TECHNIQUE: Both transabdominal and transvaginal ultrasound examinations were
performed for complete evaluation of the gestation as well as the
maternal uterus, adnexal regions, and pelvic cul-de-sac.
Transvaginal technique was performed to assess early pregnancy.

[Series 1: us ob transvaginal · 15 of 49 slices shown]
[im 1/49]
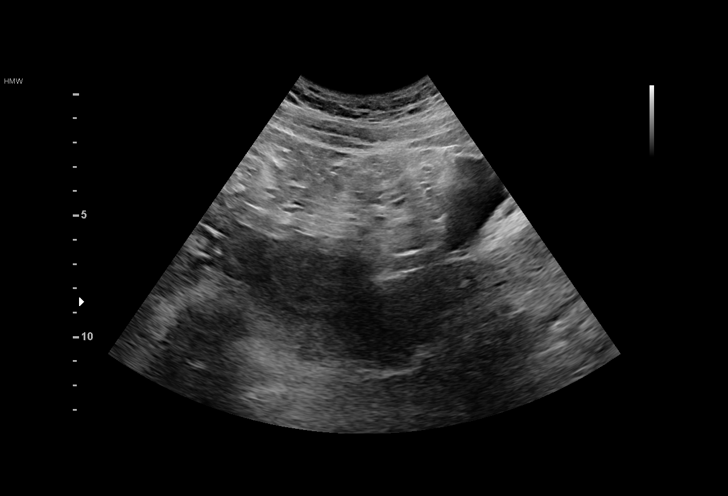
[im 4/49]
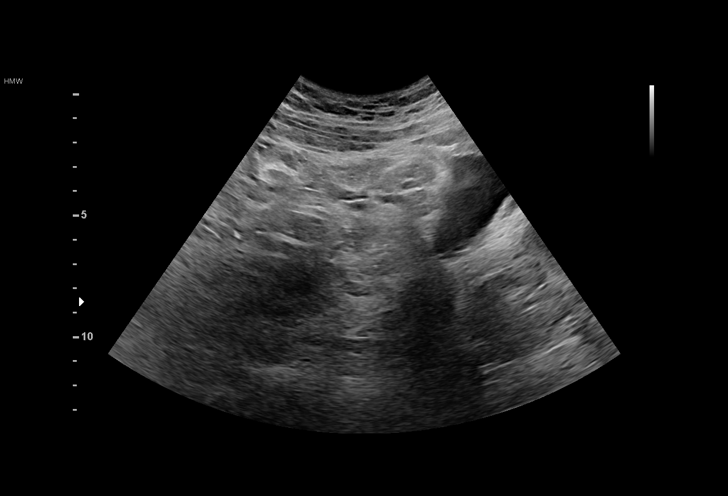
[im 8/49]
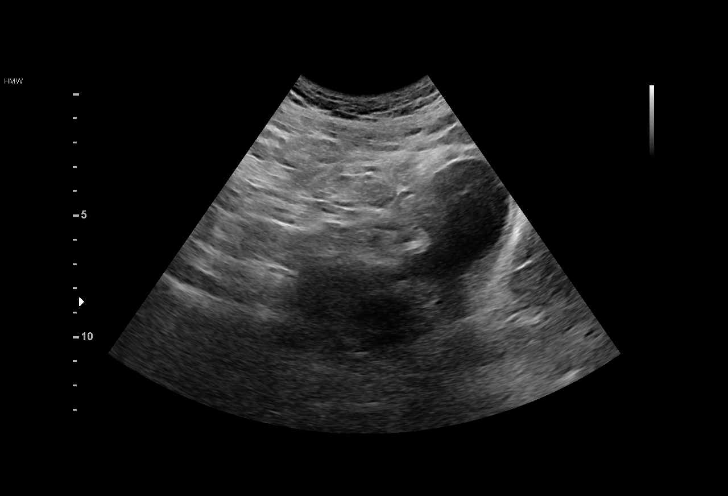
[im 11/49]
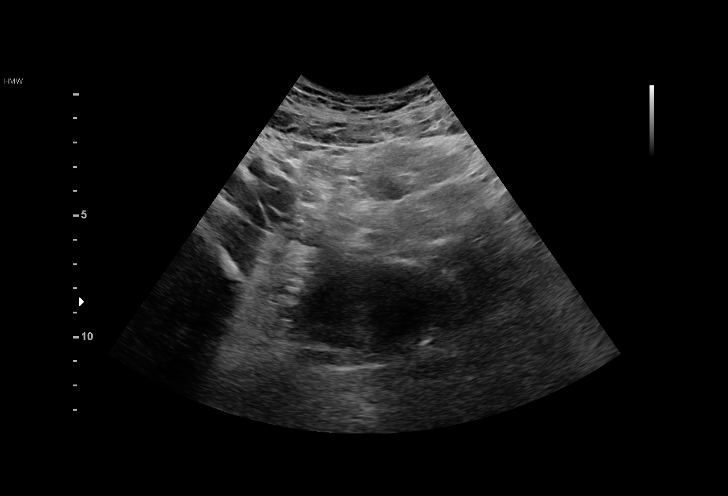
[im 15/49]
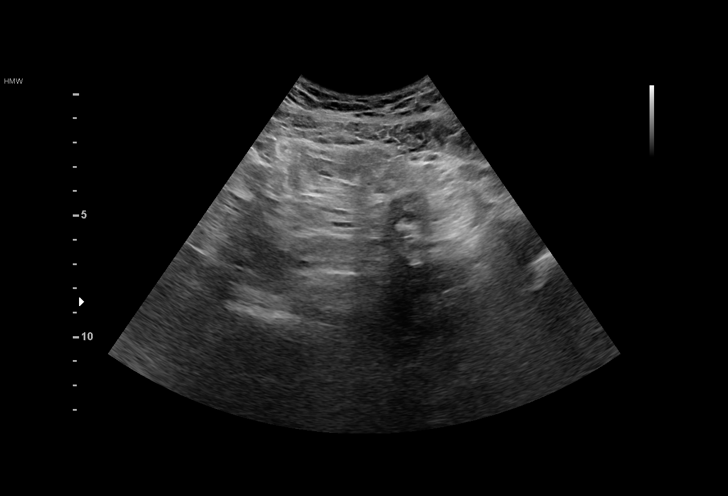
[im 18/49]
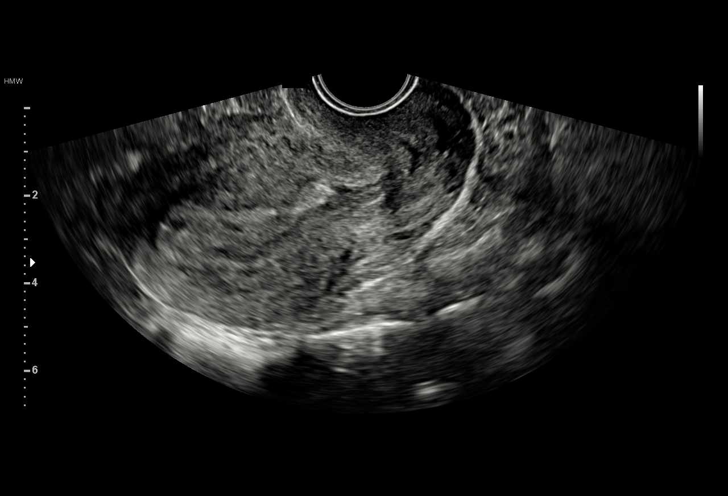
[im 22/49]
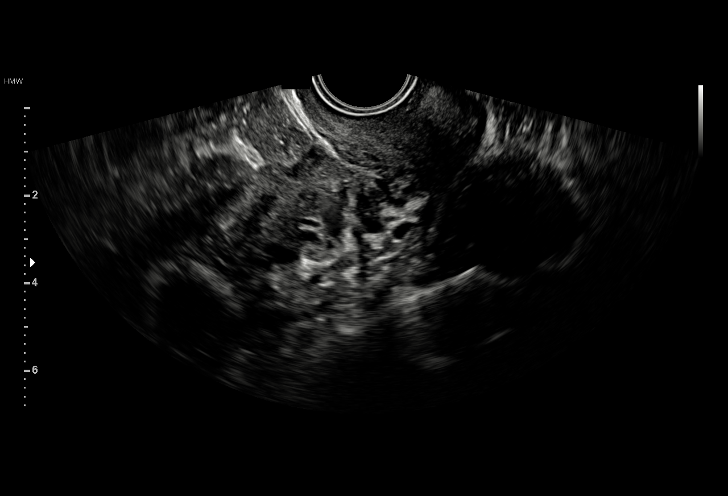
[im 25/49]
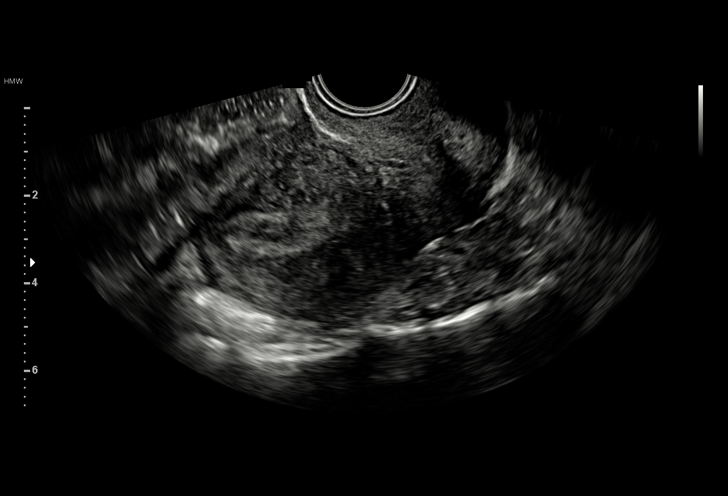
[im 27/49]
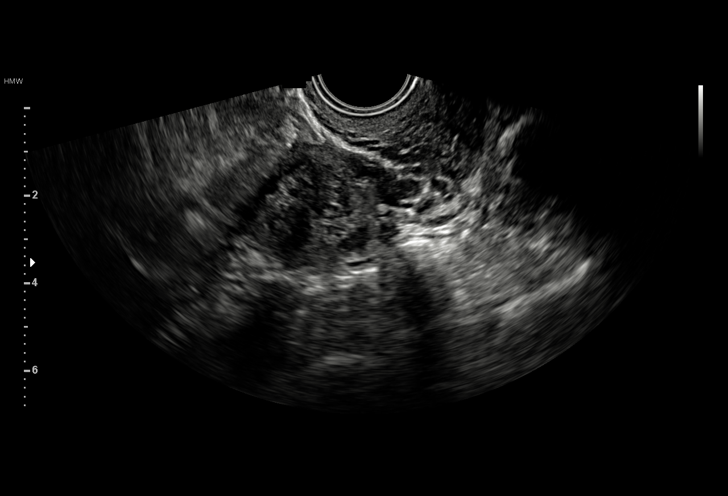
[im 31/49]
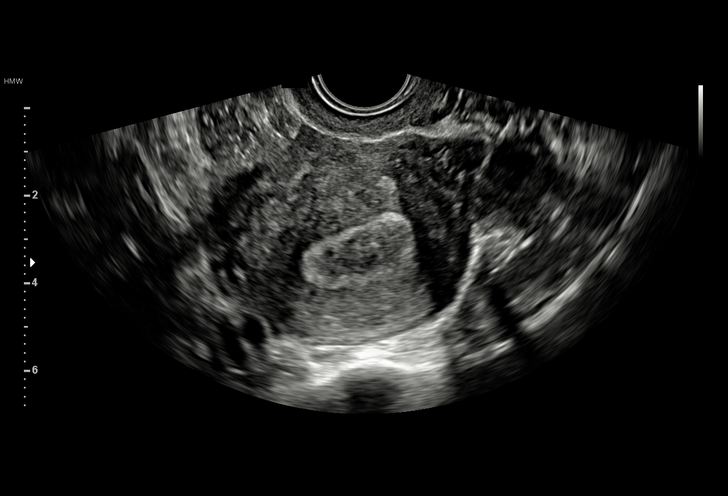
[im 34/49]
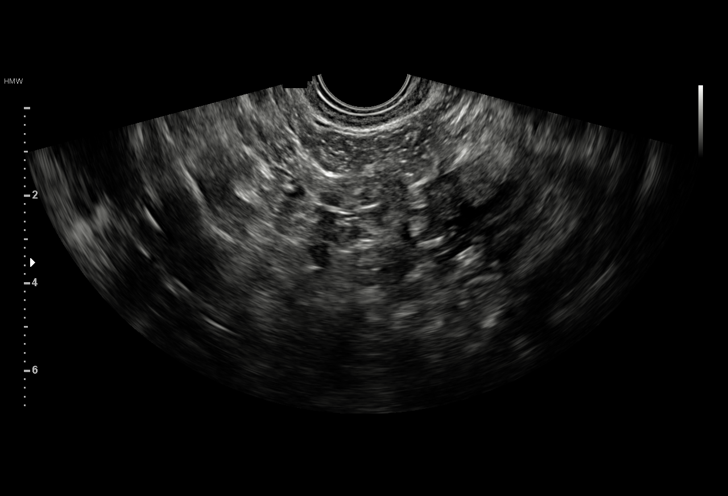
[im 38/49]
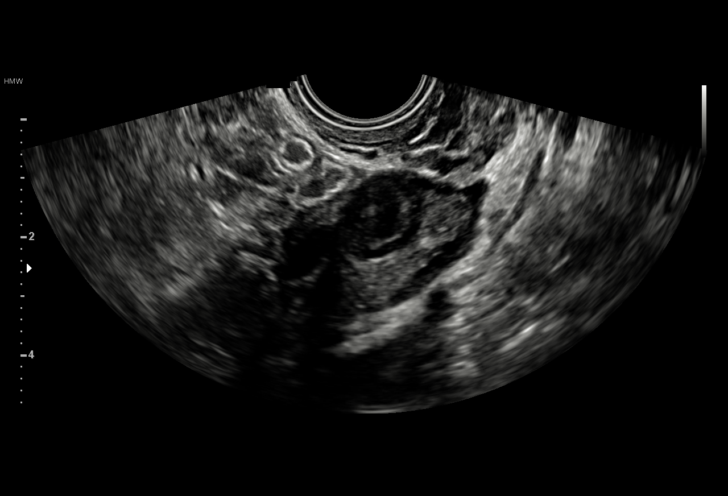
[im 41/49]
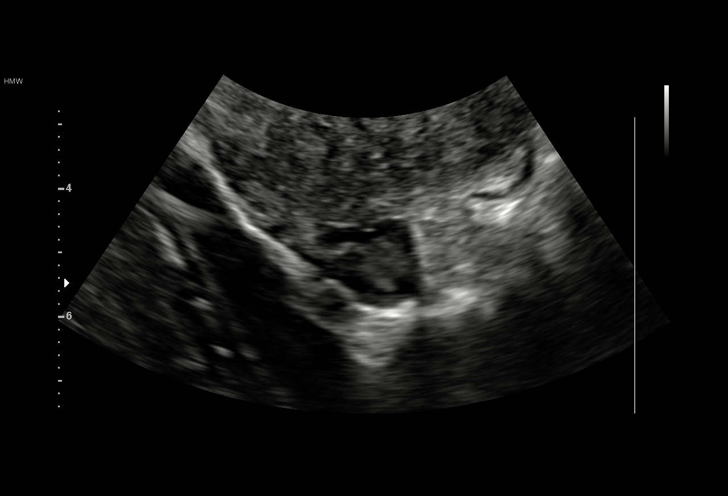
[im 45/49]
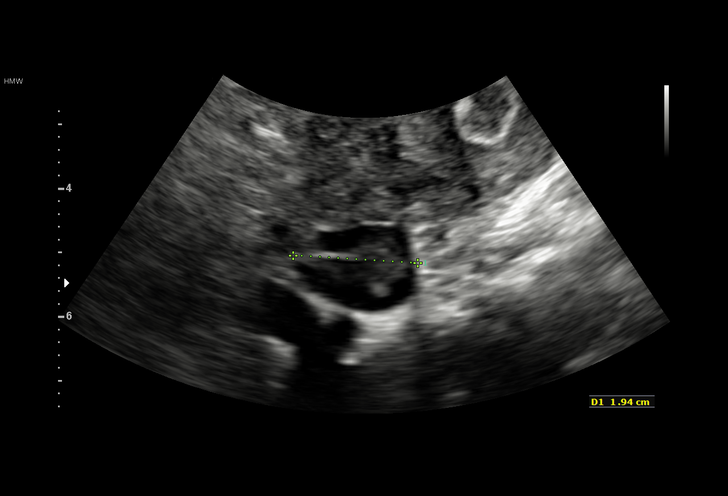
[im 49/49]
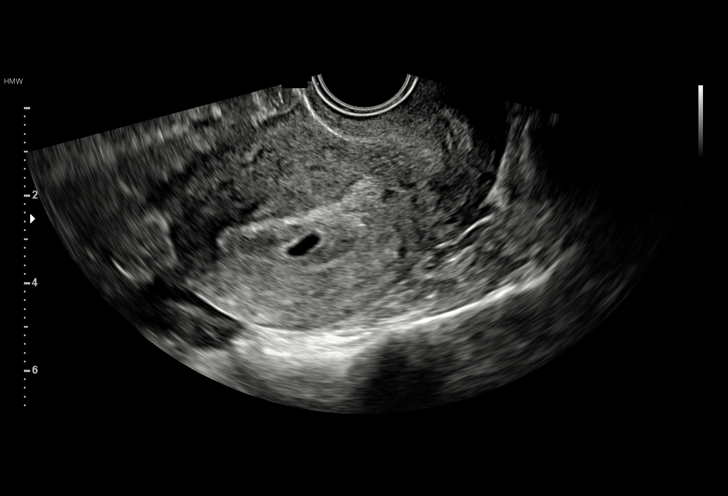

[15 of 28 positions shown; findings below may reference images not displayed]

FINDINGS: Intrauterine gestational sac: Present

Yolk sac:  Not identified

Embryo:  None identified

Cardiac Activity: N/A

Heart Rate: N/A  bpm

MSD: 6  mm   5 w   2  d

Subchorionic hemorrhage:  None identified

Maternal uterus/adnexae:

RIGHT ovary normal size and morphology, 1.9 x 2.0 x 1.5 cm.

LEFT ovary normal size at 2.3 x 2.8 x 1.7 cm containing a small
hemorrhagic corpus luteum. New line no adnexal masses or free pelvic
fluid.
IMPRESSION: Tiny gestational sac within uterus with mean sac diameter
corresponding to 5 weeks 2 days EGA.

No fetal pole identified to establish viability.

Remainder of exam unremarkable.

## 2018-09-03 ENCOUNTER — Other Ambulatory Visit (INDEPENDENT_AMBULATORY_CARE_PROVIDER_SITE_OTHER): Payer: Self-pay | Admitting: Family Medicine

## 2018-09-03 DIAGNOSIS — E559 Vitamin D deficiency, unspecified: Secondary | ICD-10-CM

## 2018-09-05 ENCOUNTER — Other Ambulatory Visit (INDEPENDENT_AMBULATORY_CARE_PROVIDER_SITE_OTHER): Payer: Self-pay | Admitting: Family Medicine

## 2018-09-05 DIAGNOSIS — F3289 Other specified depressive episodes: Secondary | ICD-10-CM

## 2018-09-12 ENCOUNTER — Encounter (INDEPENDENT_AMBULATORY_CARE_PROVIDER_SITE_OTHER): Payer: Self-pay | Admitting: Family Medicine

## 2018-09-12 ENCOUNTER — Ambulatory Visit (INDEPENDENT_AMBULATORY_CARE_PROVIDER_SITE_OTHER): Payer: BLUE CROSS/BLUE SHIELD | Admitting: Family Medicine

## 2018-09-12 VITALS — BP 131/90 | HR 93 | Temp 98.3°F | Ht 67.0 in | Wt 214.0 lb

## 2018-09-12 DIAGNOSIS — F3289 Other specified depressive episodes: Secondary | ICD-10-CM | POA: Diagnosis not present

## 2018-09-12 DIAGNOSIS — E559 Vitamin D deficiency, unspecified: Secondary | ICD-10-CM | POA: Diagnosis not present

## 2018-09-12 DIAGNOSIS — E7849 Other hyperlipidemia: Secondary | ICD-10-CM | POA: Diagnosis not present

## 2018-09-12 DIAGNOSIS — Z6833 Body mass index (BMI) 33.0-33.9, adult: Secondary | ICD-10-CM

## 2018-09-12 DIAGNOSIS — Z9189 Other specified personal risk factors, not elsewhere classified: Secondary | ICD-10-CM

## 2018-09-12 DIAGNOSIS — E669 Obesity, unspecified: Secondary | ICD-10-CM

## 2018-09-12 DIAGNOSIS — E8881 Metabolic syndrome: Secondary | ICD-10-CM | POA: Diagnosis not present

## 2018-09-12 MED ORDER — METFORMIN HCL 500 MG PO TABS: 500.0000 mg | ORAL_TABLET | Freq: Two times a day (BID) | ORAL | 0 refills | Status: DC

## 2018-09-12 MED ORDER — TOPIRAMATE 50 MG PO TABS: 50.0000 mg | ORAL_TABLET | Freq: Every day | ORAL | 0 refills | Status: DC

## 2018-09-12 NOTE — Progress Notes (Signed)
Office: [phone]  /  Fax: [phone]   HPI:   Chief Complaint: OBESITY Raven Walters is here to discuss her progress with her obesity treatment plan. She is on the Category 3 plan and is following her eating plan approximately 85 % of the time. She states she is walking 30 minutes 3 to 4 times per week. Raven Walters is doing well on her diet. She wants more variety at dinner. She is eating all of her food, except for her snack calories.  Her weight is 214 lb (97.1 kg) today and has had a weight loss of 4 pounds over a period of 2 weeks since her last visit. She has lost 6 lbs since starting treatment with [place].  Hyperlipidemia Raven Walters has hyperlipidemia and has been trying to improve her cholesterol levels with intensive lifestyle modification including a low saturated fat diet, exercise, and weight loss. Her LDL is slightly elevated at the last check of her labs. She is stable and not on a statin. She denies any chest pain or claudication. Lab Results  Component Value Date   LDLCALC 103 (H) 12/07/2017   Insulin Resistance Raven Walters has a diagnosis of insulin resistance based on her elevated fasting insulin level >5. Although Raven Walters's blood glucose readings are still under good control, insulin resistance puts her at greater risk of metabolic syndrome and diabetes. She is taking metformin currently and continues to work on diet and exercise to decrease risk of diabetes. She is stable and denies polyphagia. She also denies nausea, vomiting, and diarrhea.  At risk for diabetes Raven Walters is at higher than average risk for developing diabetes due to her insulin resistance and obesity.   Vitamin D deficiency Raven Walters has a diagnosis of vitamin D deficiency. She is currently taking vit D and denies nausea, vomiting, or muscle weakness.  Depression with emotional eating behaviors Raven Walters is struggling with emotional eating and using food for comfort to the extent that it is negatively impacting her health. Her  depression/emotional eating is well controlled with topiramate which she tolerates well. She has an IUD.  ALLERGIES: No Known Allergies  MEDICATIONS: Current Outpatient Medications on File Prior to Visit  Medication Sig Dispense Refill  . escitalopram (LEXAPRO) 10 MG tablet TAKE 1/2 TABLET FOR 3 DAYS THEN INCREASE TO ONE TABLET BY MOUTH DAILY 90 tablet 3  . fluticasone (FLONASE) 50 MCG/ACT nasal spray Place 2 sprays into both nostrils daily. 16 g 6  . labetalol (NORMODYNE) 100 MG tablet Take 1 tablet (100 mg total) by mouth 2 (two) times daily. 180 tablet 1  . levonorgestrel (MIRENA, 52 MG,) 20 MCG/24HR IUD 1 Device by Intrauterine route once. [id number]    . Vitamin D, Ergocalciferol, (DRISDOL) 50000 units CAPS capsule Take 1 capsule (50,000 Units total) by mouth every 7 (seven) days. 4 capsule 0   No current facility-administered medications on file prior to visit.     PAST MEDICAL HISTORY: Past Medical History:  Diagnosis Date  . Anxiety   . Asthma    pt states it is induced by excercise  . Chest pain 02/07/2017  . Dyspnea   . Essential hypertension 02/07/2017  . Headache   . Hx MRSA infection   . Infection    UTI  . Palpitations 02/07/2017  . Preeclampsia   . PVC (premature ventricular contraction) 03/18/2017    PAST SURGICAL HISTORY: Past Surgical History:  Procedure Laterality Date  . NO PAST SURGERIES      SOCIAL HISTORY: Social History   Tobacco Use  .  Smoking status: Never Smoker  . Smokeless tobacco: Never Used  Substance Use Topics  . Alcohol use: Yes    Comment: social  . Drug use: No    FAMILY HISTORY: Family History  Problem Relation Age of Onset  . Hypertension Mother   . Cardiomyopathy Mother   . Heart disease Mother   . Anxiety disorder Mother   . Obesity Mother   . Hypertension Father   . Heart disease Father   . Hyperlipidemia Father   . Miscarriages / [place] Son   . Hypertension Brother   . Diabetes Maternal Grandmother   . Heart  disease Maternal Grandfather   . Hypertension Maternal Grandfather   . Stroke Maternal Grandfather   . Heart disease Paternal Grandmother   . Hypertension Paternal Grandmother   . Stroke Paternal Grandmother   . Alzheimer's disease Paternal Grandmother   . Hearing loss Neg Hx     ROS: Review of Systems  Constitutional: Positive for weight loss.  Cardiovascular: Negative for chest pain and claudication.  Gastrointestinal: Negative for diarrhea, nausea and vomiting.  Musculoskeletal:       Negative for muscle weakness.  Endo/Heme/Allergies:       Negative for polyphagia.  Psychiatric/Behavioral: Positive for depression.    PHYSICAL EXAM: Blood pressure 131/90, pulse 93, temperature 98.3 F (36.8 C), height 5\' 7"  (1.702 m), weight 214 lb (97.1 kg), SpO2 100 %. Body mass index is 33.52 kg/m. Physical Exam  Constitutional: She is oriented to person, place, and time. She appears well-developed and well-nourished.  Cardiovascular: Normal rate.  Pulmonary/Chest: Effort normal.  Musculoskeletal: Normal range of motion.  Neurological: She is oriented to person, place, and time.  Skin: Skin is warm and dry.  Psychiatric: She has a normal mood and affect. Her behavior is normal.  Vitals reviewed.   RECENT LABS AND TESTS: BMET    Component Value Date/Time   NA 137 08/11/2018 0811   NA 137 12/07/2017 1125   K 4.1 08/11/2018 0811   CL 105 08/11/2018 0811   CO2 24 08/11/2018 0811   GLUCOSE 95 08/11/2018 0811   BUN 10 08/11/2018 0811   BUN 10 12/07/2017 1125   CREATININE 0.81 08/11/2018 0811   CALCIUM 8.8 (L) 08/11/2018 0811   GFRNONAA >60 08/11/2018 0811   GFRAA >60 08/11/2018 0811   Lab Results  Component Value Date   HGBA1C 5.4 12/07/2017   Lab Results  Component Value Date   INSULIN 13.7 12/07/2017   CBC    Component Value Date/Time   WBC 9.5 08/11/2018 0811   RBC 4.16 08/11/2018 0811   HGB 12.4 08/11/2018 0811   HGB 12.9 12/07/2017 1125   HCT 38.5 08/11/2018  0811   HCT 37.7 12/07/2017 1125   PLT 247 08/11/2018 0811   PLT 251 01/13/2017 1604   MCV 92.5 08/11/2018 0811   MCV 88 12/07/2017 1125   MCH 29.8 08/11/2018 0811   MCHC 32.2 08/11/2018 0811   RDW 13.5 08/11/2018 0811   RDW 14.3 12/07/2017 1125   LYMPHSABS 3.0 12/07/2017 1125   EOSABS 0.2 12/07/2017 1125   BASOSABS 0.0 12/07/2017 1125   Iron/TIBC/Ferritin/ %Sat No results found for: IRON, TIBC, FERRITIN, IRONPCTSAT Lipid Panel     Component Value Date/Time   CHOL 164 12/07/2017 1125   TRIG 89 12/07/2017 1125   HDL 43 12/07/2017 1125   CHOLHDL 3.4 01/13/2017 1604   LDLCALC 103 (H) 12/07/2017 1125   Hepatic Function Panel     Component Value Date/Time  PROT 7.8 12/07/2017 1125   ALBUMIN 4.2 12/07/2017 1125   AST 15 12/07/2017 1125   ALT 15 12/07/2017 1125   ALKPHOS 61 12/07/2017 1125   BILITOT 0.5 12/07/2017 1125      Component Value Date/Time   TSH 0.804 12/07/2017 1125   TSH 1.090 01/13/2017 1604   Results for Hocevar, Majel N "Tenasia Evitt" (MRN [id number]) as of 09/12/2018 13:14  Ref. Range 12/07/2017 11:25  Vitamin D, 25-Hydroxy Latest Ref Range: 30.0 - 100.0 ng/mL 15.6 (L)   ASSESSMENT AND PLAN: Other hyperlipidemia  Insulin resistance - Plan: metFORMIN (GLUCOPHAGE) 500 MG tablet  Vitamin D deficiency  Other depression - with emotional eating - Plan: topiramate (TOPAMAX) 50 MG tablet  At risk for diabetes mellitus  Class 1 obesity with serious comorbidity and body mass index (BMI) of 33.0 to 33.9 in adult, unspecified obesity type  PLAN:  Hyperlipidemia Iridessa was informed of the American Heart Association Guidelines emphasizing intensive lifestyle modifications as the first line treatment for hyperlipidemia. We discussed many lifestyle modifications today in depth, and Raven Walters will continue to work on decreasing saturated fats such as fatty red meat, butter and many fried foods. She will also increase vegetables and lean protein in her diet and continue to  work on exercise and weight loss efforts. We will recheck her FLP today. She agrees to continue her diet and follow up in 2 weeks.  Insulin Resistance Raven Walters will continue to work on weight loss, exercise, and decreasing simple carbohydrates in her diet to help decrease the risk of diabetes. She was informed that eating too many simple carbohydrates or too many calories at one sitting increases the likelihood of GI side effects. Raven Walters agrees to continue metformin 500mg  BID #60 with no refills and prescription was written today. We will check and A1c and a fasting Insulin today. Raven Walters agreed to follow up with [place] as directed to monitor her progress.  Diabetes risk counseling Raven Walters was given extended (15 minutes) diabetes prevention counseling today. She is 32 y.o. female and has risk factors for diabetes including insulin resistance and obesity. We discussed intensive lifestyle modifications today with an emphasis on weight loss as well as increasing exercise and decreasing simple carbohydrates in her diet.  Vitamin D Deficiency Raven Walters was informed that low vitamin D levels contributes to fatigue and are associated with obesity, breast, and colon cancer. She agrees to continue to take prescription Vit D @50 ,000 IU every week and will follow up for routine testing of vitamin D, at least 2-3 times per year. She was informed of the risk of over-replacement of vitamin D and agrees to not increase her dose unless she discusses this with [place] first. We will recheck her vitamin D level today and she agrees to follow up in 2 weeks.  Depression with Emotional Eating Behaviors We discussed behavior modification techniques today to help Raven Walters deal with her emotional eating and depression. She has agreed to take Topamax 50 mg at bedtime #30 with no refills and agreed to follow up as directed in 2 weeks. We discussed that topiramate is teratogenic and she must continue to use reliable birth control while taking the  medication. She currently has an IUD.   Obesity Leaner is currently in the action stage of change. As such, her goal is to continue with weight loss efforts. She has agreed to follow the Category 3 plan and add journaling 450 to 600 calories + 40 grams of protein for supper. She agrees to eat all  of her snack calories. Raven Walters has been instructed to continue walking 3 to 4 times per week. We discussed the following Behavioral Modification Strategies today: increase H2O intake.  Raven Walters has agreed to follow up with our clinic in 2 weeks. She was informed of the importance of frequent follow up visits to maximize her success with intensive lifestyle modifications for her multiple health conditions.   OBESITY BEHAVIORAL INTERVENTION VISIT  Today's visit was # 11   Starting weight: 220 lbs Starting date: 12/07/17 Today's weight : Weight: 214 lb (97.1 kg)  Today's date: 09/12/2018 Total lbs lost to date: 6  ASK: We discussed the diagnosis of obesity with Raven Walters today and Raven Walters agreed to give [place] permission to discuss obesity behavioral modification therapy today.  ASSESS: Raven Walters has the diagnosis of obesity and her BMI today is 33.51. Athena is in the action stage of change.   ADVISE: Raven Walters was educated on the multiple health risks of obesity as well as the benefit of weight loss to improve her health. She was advised of the need for long term treatment and the importance of lifestyle modifications to improve her current health and to decrease her risk of future health problems.  AGREE: Multiple dietary modification options and treatment options were discussed and Raven Walters agreed to follow the recommendations documented in the above note.  ARRANGE: Raven Walters was educated on the importance of frequent visits to treat obesity as outlined per CMS and USPSTF guidelines and agreed to schedule her next follow up appointment today.  Launa Flight, am acting as Energy manager for Jesse Sans, FNP.  I have reviewed the above documentation for accuracy and completeness, and I agree with the above.  - Kashden Deboy, FNP-C.

## 2018-09-13 LAB — VITAMIN D 25 HYDROXY (VIT D DEFICIENCY, FRACTURES): Vit D, 25-Hydroxy: 18.3 ng/mL — ABNORMAL LOW (ref 30.0–100.0)

## 2018-09-13 LAB — LIPID PANEL WITH LDL/HDL RATIO
Cholesterol, Total: 165 mg/dL (ref 100–199)
HDL: 43 mg/dL (ref >39)
LDL Calculated: 109 mg/dL — ABNORMAL HIGH (ref 0–99)
LDl/HDL Ratio: 2.5 ratio (ref 0.0–3.2)
Triglycerides: 66 mg/dL (ref 0–149)
VLDL Cholesterol Cal: 13 mg/dL (ref 5–40)

## 2018-09-13 LAB — HEMOGLOBIN A1C
Est. average glucose Bld gHb Est-mCnc: 103 mg/dL
Hgb A1c MFr Bld: 5.2 % (ref 4.8–5.6)

## 2018-09-13 LAB — INSULIN, RANDOM: INSULIN: 36.9 u[IU]/mL — ABNORMAL HIGH (ref 2.6–24.9)

## 2018-09-27 ENCOUNTER — Ambulatory Visit (INDEPENDENT_AMBULATORY_CARE_PROVIDER_SITE_OTHER): Payer: BLUE CROSS/BLUE SHIELD | Admitting: Physician Assistant

## 2018-09-27 VITALS — BP 130/90 | HR 91 | Temp 98.8°F | Ht 67.0 in | Wt 212.0 lb

## 2018-09-27 DIAGNOSIS — E559 Vitamin D deficiency, unspecified: Secondary | ICD-10-CM

## 2018-09-27 DIAGNOSIS — Z9189 Other specified personal risk factors, not elsewhere classified: Secondary | ICD-10-CM

## 2018-09-27 DIAGNOSIS — E8881 Metabolic syndrome: Secondary | ICD-10-CM

## 2018-09-27 DIAGNOSIS — Z6833 Body mass index (BMI) 33.0-33.9, adult: Secondary | ICD-10-CM

## 2018-09-27 DIAGNOSIS — E669 Obesity, unspecified: Secondary | ICD-10-CM | POA: Diagnosis not present

## 2018-09-27 MED ORDER — VITAMIN D (ERGOCALCIFEROL) 1.25 MG (50000 UNIT) PO CAPS: 50000.0000 [IU] | ORAL_CAPSULE | ORAL | 0 refills | Status: DC

## 2018-09-29 NOTE — Progress Notes (Signed)
Office: [phone]  /  Fax: [phone]   HPI:   Chief Complaint: OBESITY Raven Walters is here to discuss her progress with her obesity treatment plan. She is on the keep a food journal with 450-600 calories and 40+ grams of protein at supper daily and follow the Category 3 plan and is following her eating plan approximately 85 % of the time. She states she is doing cardio for 30 minutes 3 times per week. Raven Walters did well with weight loss. She reports that she has been attempting to journal dinner, but she is not always consistent. She is cravings potatoes.  Her weight is 212 lb (96.2 kg) today and has had a weight loss of 2 pounds over a period of 2 weeks since her last visit. She has lost 8 lbs since starting treatment with [place].  Vitamin D Deficiency Raven Walters has a diagnosis of vitamin D deficiency. She is currently taking prescription Vit D and denies nausea, vomiting or muscle weakness.  At risk for osteopenia and osteoporosis Raven Walters is at higher risk of osteopenia and osteoporosis due to vitamin D deficiency.   Insulin Resistance Raven Walters has a diagnosis of insulin resistance based on her elevated fasting insulin level >5. Although Raven Walters blood glucose readings are still under good control, insulin resistance puts her at greater risk of metabolic syndrome and diabetes. She is on metformin and denies nausea, vomiting, or diarrhea. She reports cravings for simple carbohydrates especially potatoes. He last insulin level was elevated. She continues to work on diet and exercise to decrease risk of diabetes.  ALLERGIES: No Known Allergies  MEDICATIONS: Current Outpatient Medications on File Prior to Visit  Medication Sig Dispense Refill  . escitalopram (LEXAPRO) 10 MG tablet TAKE 1/2 TABLET FOR 3 DAYS THEN INCREASE TO ONE TABLET BY MOUTH DAILY 90 tablet 3  . fluticasone (FLONASE) 50 MCG/ACT nasal spray Place 2 sprays into both nostrils daily. 16 g 6  . labetalol (NORMODYNE) 100 MG tablet Take  1 tablet (100 mg total) by mouth 2 (two) times daily. 180 tablet 1  . levonorgestrel (MIRENA, 52 MG,) 20 MCG/24HR IUD 1 Device by Intrauterine route once. [id number]    . metFORMIN (GLUCOPHAGE) 500 MG tablet Take 1 tablet (500 mg total) by mouth 2 (two) times daily with a meal. 60 tablet 0  . topiramate (TOPAMAX) 50 MG tablet Take 1 tablet (50 mg total) by mouth daily. 30 tablet 0   No current facility-administered medications on file prior to visit.     PAST MEDICAL HISTORY: Past Medical History:  Diagnosis Date  . Anxiety   . Asthma    pt states it is induced by excercise  . Chest pain 02/07/2017  . Dyspnea   . Essential hypertension 02/07/2017  . Headache   . Hx MRSA infection   . Infection    UTI  . Palpitations 02/07/2017  . Preeclampsia   . PVC (premature ventricular contraction) 03/18/2017    PAST SURGICAL HISTORY: Past Surgical History:  Procedure Laterality Date  . NO PAST SURGERIES      SOCIAL HISTORY: Social History   Tobacco Use  . Smoking status: Never Smoker  . Smokeless tobacco: Never Used  Substance Use Topics  . Alcohol use: Yes    Comment: social  . Drug use: No    FAMILY HISTORY: Family History  Problem Relation Age of Onset  . Hypertension Mother   . Cardiomyopathy Mother   . Heart disease Mother   . Anxiety disorder Mother   .  Obesity Mother   . Hypertension Father   . Heart disease Father   . Hyperlipidemia Father   . Misc[place]riages [place]   . Hypertension Brother   . Diabetes Maternal Grandmother   . Heart disease Maternal Grandfather   . Hypertension Maternal Grandfather   . Stroke Maternal Grandfather   . Heart disease Paternal Grandmother   . Hypertension Paternal Grandmother   . Stroke Paternal Grandmother   . Alzheimer's disease Paternal Grandmother   . Hearing loss Neg Hx     ROS: Review of Systems  Constitutional: Positive for weight loss.  Gastrointestinal: Negative for diarrhea, nausea and vomiting.    Musculoskeletal:       Negative muscle weakness    PHYSICAL EXAM: Blood pressure 130/90, pulse 91, temperature 98.8 F (37.1 C), temperature source Oral, height 5\' 7"  (1.702 m), weight 212 lb (96.2 kg), SpO2 98 %. Body mass index is 33.2 kg/m. Physical Exam  Constitutional: She is oriented to person, place, and time. She appears well-developed and well-nourished.  Cardiovascular: Normal rate.  Pulmonary/Chest: Effort normal.  Musculoskeletal: Normal range of motion.  Neurological: She is oriented to person, place, and time.  Skin: Skin is warm and dry.  Psychiatric: She has a normal mood and affect. Her behavior is normal.  Vitals reviewed.   RECENT LABS AND TESTS: BMET    Component Value Date/Time   NA 137 08/11/2018 0811   NA 137 12/07/2017 1125   K 4.1 08/11/2018 0811   CL 105 08/11/2018 0811   CO2 24 08/11/2018 0811   GLUCOSE 95 08/11/2018 0811   BUN 10 08/11/2018 0811   BUN 10 12/07/2017 1125   CREATININE 0.81 08/11/2018 0811   CALCIUM 8.8 (L) 08/11/2018 0811   GFRNONAA >60 08/11/2018 0811   GFRAA >60 08/11/2018 0811   Lab Results  Component Value Date   HGBA1C 5.2 09/12/2018   HGBA1C 5.4 12/07/2017   Lab Results  Component Value Date   INSULIN 36.9 (H) 09/12/2018   INSULIN 13.7 12/07/2017   CBC    Component Value Date/Time   WBC 9.5 08/11/2018 0811   RBC 4.16 08/11/2018 0811   HGB 12.4 08/11/2018 0811   HGB 12.9 12/07/2017 1125   HCT 38.5 08/11/2018 0811   HCT 37.7 12/07/2017 1125   PLT 247 08/11/2018 0811   PLT 251 01/13/2017 1604   MCV 92.5 08/11/2018 0811   MCV 88 12/07/2017 1125   MCH 29.8 08/11/2018 0811   MCHC 32.2 08/11/2018 0811   RDW 13.5 08/11/2018 0811   RDW 14.3 12/07/2017 1125   LYMPHSABS 3.0 12/07/2017 1125   EOSABS 0.2 12/07/2017 1125   BASOSABS 0.0 12/07/2017 1125   Iron/TIBC/Ferritin/ %Sat No results found for: IRON, TIBC, FERRITIN, IRONPCTSAT Lipid Panel     Component Value Date/Time   CHOL 165 09/12/2018 0808   TRIG  66 09/12/2018 0808   HDL 43 09/12/2018 0808   CHOLHDL 3.4 01/13/2017 1604   LDLCALC 109 (H) 09/12/2018 0808   Hepatic Function Panel     Component Value Date/Time   PROT 7.8 12/07/2017 1125   ALBUMIN 4.2 12/07/2017 1125   AST 15 12/07/2017 1125   ALT 15 12/07/2017 1125   ALKPHOS 61 12/07/2017 1125   BILITOT 0.5 12/07/2017 1125      Component Value Date/Time   TSH 0.804 12/07/2017 1125   TSH 1.090 01/13/2017 1604  Results for Job, Caroleen N "Ogechi Soulier" (MRN [id number]) as of 09/29/2018 16:18  Ref. Range 09/12/2018 08:08  Vitamin  D, 25-Hydroxy Latest Ref Range: 30.0 - 100.0 ng/mL 18.3 (L)    ASSESSMENT AND PLAN: Vitamin D deficiency - Plan: Vitamin D, Ergocalciferol, (DRISDOL) 1.25 MG (50000 UT) CAPS capsule  Insulin resistance  At risk for osteoporosis  Class 1 obesity with serious comorbidity and body mass index (BMI) of 33.0 to 33.9 in adult, unspecified obesity type  PLAN:  Vitamin D Deficiency Raven Walters was informed that low vitamin D levels contributes to fatigue and are associated with obesity, breast, and colon cancer. Raven Walters agrees to change to prescription Vit D @50 ,000 IU every 3 days #10 with no refills. She will follow up for routine testing of vitamin D, at least 2-3 times per year. She was informed of the risk of over-replacement of vitamin D and agrees to not increase her dose unless she discusses this with [place] first. Raven Walters agrees to follow up with our clinic in 2 to 3 weeks.  At risk for osteopenia and osteoporosis Raven Walters was given extended (15 minutes) osteoporosis prevention counseling today. Raven Walters is at risk for osteopenia and osteoporsis due to her vitamin D deficiency. She was encouraged to take her vitamin D and follow her higher calcium diet and increase strengthening exercise to help strengthen her bones and decrease her risk of osteopenia and osteoporosis.  Insulin Resistance Raven Walters will continue to work on weight loss, diet, exercise, and decreasing  simple carbohydrates in her diet to help decrease the risk of diabetes. We dicussed metformin including benefits and risks. She was informed that eating too many simple carbohydrates or too many calories at one sitting increases the likelihood of GI side effects. Chrysa agrees to continue taking metformin and she agrees to follow up with our clinic in 2 to 3 weeks as directed to monitor her progress.  Obesity Raven Walters is currently in the action stage of change. As such, her goal is to continue with weight loss efforts She has agreed to follow the Category 3 plan Raven Walters has been instructed to work up to a goal of 150 minutes of combined cardio and strengthening exercise per week for weight loss and overall health benefits. We discussed the following Behavioral Modification Strategies today: work on meal planning and easy cooking plans and ways to avoid boredom eating   Raven Walters has agreed to follow up with our clinic in 2 to 3 weeks. She was informed of the importance of frequent follow up visits to maximize her success with intensive lifestyle modifications for her multiple health conditions.   OBESITY BEHAVIORAL INTERVENTION VISIT  Today's visit was # 12  Starting weight: 220 lbs Starting date: 12/07/17 Today's weight : 212 lbs Today's date: 09/27/2018 Total lbs lost to date: 8    ASK: We discussed the diagnosis of obesity with Cristino Martes today and Sharina agreed to give [place] permission to discuss obesity behavioral modification therapy today.  ASSESS: Levetta has the diagnosis of obesity and her BMI today is 33.2 Torsha is in the action stage of change   ADVISE: Deavion was educated on the multiple health risks of obesity as well as the benefit of weight loss to improve her health. She was advised of the need for long term treatment and the importance of lifestyle modifications.  AGREE: Multiple dietary modification options and treatment options were discussed and  Linette agreed to  the above obesity treatment plan.  Trude Mcburney, am acting as transcriptionist for Alois Cliche, PA-C I, Alois Cliche, PA-C have reviewed above note and agree with its content

## 2018-10-04 DIAGNOSIS — Z124 Encounter for screening for malignant neoplasm of cervix: Secondary | ICD-10-CM | POA: Diagnosis not present

## 2018-10-04 DIAGNOSIS — Z01419 Encounter for gynecological examination (general) (routine) without abnormal findings: Secondary | ICD-10-CM | POA: Diagnosis not present

## 2018-10-04 DIAGNOSIS — Z6832 Body mass index (BMI) 32.0-32.9, adult: Secondary | ICD-10-CM | POA: Diagnosis not present

## 2018-10-06 ENCOUNTER — Telehealth: Payer: BLUE CROSS/BLUE SHIELD | Admitting: Family

## 2018-10-06 DIAGNOSIS — R300 Dysuria: Secondary | ICD-10-CM

## 2018-10-06 NOTE — Progress Notes (Signed)
Thank you for the details you included in the comment boxes. Those details are very helpful in determining the best course of treatment for you and help us to provide the best care. Given that you have back pain, we need a physical exam and a urine culture to ensure you do not have another serious condition such as pyelonephritis.   Based on what you shared with me it looks like you have a serious condition that should be evaluated in a face to face office visit.  NOTE: If you entered your credit card information for this eVisit, you will not be charged. You may see a "hold" on your card for the $30 but that hold will drop off and you will not have a charge processed.  If you are having a true medical emergency please call 911.  If you need an urgent face to face visit, [place] has four urgent care centers for your convenience.  If you need care fast and have a high deductible or no insurance consider:   [email or web address] to reserve your spot online an avoid wait times  [facility] [address], [address] [place], [place] [address] 8 am to 8 pm Monday-Friday 10 am to 4 pm Saturday-Sunday *Across the street from [employer]  [facility] [place]  [address] [place], [id number] 8 am to 5 pm Monday-Friday * In the [facility] on the [facility]   The following sites will take your  insurance:  . [facility]  [phone] Get Driving Directions Find a Provider at this Location  [address] [place], [place] [address] . 10 am to 8 pm Monday-Friday . 12 pm to 8 pm Saturday-Sunday   . [facility]   [phone] Get Driving Directions Find a Provider at this Location  [address] [place], [place] [address] . 8 am to 8 pm Monday-Friday . 9 am to 6 pm Saturday . 11 am to 6 pm Sunday   . [facility] [place]  [phone] Get Driving  Directions  [address] [place], [place] [address] . 8 am to 8 pm Monday-Friday . 8 am to 4 pm Saturday-Sunday   Your e-visit answers were reviewed by a board certified advanced clinical practitioner to complete your personal care plan.  Thank you for using e-Visits.

## 2018-10-07 DIAGNOSIS — N390 Urinary tract infection, site not specified: Secondary | ICD-10-CM | POA: Diagnosis not present

## 2018-10-18 ENCOUNTER — Ambulatory Visit (INDEPENDENT_AMBULATORY_CARE_PROVIDER_SITE_OTHER): Payer: BLUE CROSS/BLUE SHIELD | Admitting: Family Medicine

## 2018-10-18 ENCOUNTER — Encounter (INDEPENDENT_AMBULATORY_CARE_PROVIDER_SITE_OTHER): Payer: Self-pay | Admitting: Family Medicine

## 2018-10-18 VITALS — BP 133/90 | HR 98 | Temp 98.2°F | Ht 67.0 in | Wt 205.0 lb

## 2018-10-18 DIAGNOSIS — E559 Vitamin D deficiency, unspecified: Secondary | ICD-10-CM

## 2018-10-18 DIAGNOSIS — E66811 Obesity, class 1: Secondary | ICD-10-CM

## 2018-10-18 DIAGNOSIS — F3289 Other specified depressive episodes: Secondary | ICD-10-CM

## 2018-10-18 DIAGNOSIS — Z6832 Body mass index (BMI) 32.0-32.9, adult: Secondary | ICD-10-CM

## 2018-10-18 DIAGNOSIS — Z9189 Other specified personal risk factors, not elsewhere classified: Secondary | ICD-10-CM

## 2018-10-18 DIAGNOSIS — E669 Obesity, unspecified: Secondary | ICD-10-CM

## 2018-10-18 MED ORDER — VITAMIN D (ERGOCALCIFEROL) 1.25 MG (50000 UNIT) PO CAPS: 50000.0000 [IU] | ORAL_CAPSULE | ORAL | 0 refills | Status: DC

## 2018-10-18 MED ORDER — TOPIRAMATE 50 MG PO TABS: 50.0000 mg | ORAL_TABLET | Freq: Every day | ORAL | 0 refills | Status: DC

## 2018-10-18 NOTE — Progress Notes (Signed)
Office: [phone]  /  Fax: [phone]   HPI:   Chief Complaint: OBESITY Raven Walters is here to discuss her progress with her obesity treatment plan. She is on the  follow the Category 3 plan and is following her eating plan approximately 80 % of the time. She states she is exercising by doing cardio and strength training for 30 minutes 3 times per week. [name] stuck to plan well over Thanksgiving. She is very focused on her goal.   Her weight is 205 lb (93 kg) today and has had a weight loss of 7 pounds over a period of 3 weeks since her last visit. She has lost 15 lbs since starting treatment with [place].  Vitamin D deficiency Raven Walters has a diagnosis of vitamin D deficiency. She is currently taking vit D and denies nausea, vomiting or muscle weakness.  Ref. Range 09/12/2018 08:08  Vitamin D, 25-Hydroxy Latest Ref Range: 30.0 - 100.0 ng/mL 18.3 (L)   Depression with emotional eating behaviors Raven Walters is struggling with emotional eating and using food for comfort to the extent that it is negatively impacting her health. She often snacks when she is not hungry. Raven Walters sometimes feels she is out of control and then feels guilty that she made poor food choices. She has been working on behavior modification techniques to help reduce her emotional eating and has been somewhat successful. She is currently on Topamax 50 mg daily and has mild finger tingling from Topamax. Topamax is working well for suppression of cravings.   She shows no sign of suicidal or homicidal ideations.  Depression screen [facility] 2/9 06/22/2018 02/16/2018 12/07/2017 06/25/2017 06/18/2017  Decreased Interest 0 0 3 0 0  Down, Depressed, Hopeless 0 0 1 0 0  PHQ - 2 Score 0 0 4 0 0  Altered sleeping - - 3 - -  Tired, decreased energy - - 3 - -  Change in appetite - - 3 - -  Feeling bad or failure about yourself  - - 2 - -  Trouble concentrating - - 3 - -  Moving slowly or fidgety/restless - - 1 - -  Suicidal thoughts - - 0 - -  PHQ-9  Score - - 19 - -  Difficult doing work/chores - - Not difficult at all - -     ALLERGIES: No Known Allergies  MEDICATIONS: Current Outpatient Medications on File Prior to Visit  Medication Sig Dispense Refill  . escitalopram (LEXAPRO) 10 MG tablet TAKE 1/2 TABLET FOR 3 DAYS THEN INCREASE TO ONE TABLET BY MOUTH DAILY 90 tablet 3  . fluticasone (FLONASE) 50 MCG/ACT nasal spray Place 2 sprays into both nostrils daily. 16 g 6  . labetalol (NORMODYNE) 100 MG tablet Take 1 tablet (100 mg total) by mouth 2 (two) times daily. 180 tablet 1  . levonorgestrel (MIRENA, 52 MG,) 20 MCG/24HR IUD 1 Device by Intrauterine route once. [id number]    . metFORMIN (GLUCOPHAGE) 500 MG tablet Take 1 tablet (500 mg total) by mouth 2 (two) times daily with a meal. 60 tablet 0   No current facility-administered medications on file prior to visit.     PAST MEDICAL HISTORY: Past Medical History:  Diagnosis Date  . Anxiety   . Asthma    pt states it is induced by excercise  . Chest pain 02/07/2017  . Dyspnea   . Essential hypertension 02/07/2017  . Headache   . Hx MRSA infection   . Infection    UTI  . Palpitations 02/07/2017  .  Preeclampsia   . PVC (premature ventricular contraction) 03/18/2017    PAST SURGICAL HISTORY: Past Surgical History:  Procedure Laterality Date  . NO PAST SURGERIES      SOCIAL HISTORY: Social History   Tobacco Use  . Smoking status: Never Smoker  . Smokeless tobacco: Never Used  Substance Use Topics  . Alcohol use: Yes    Comment: social  . Drug use: No    FAMILY HISTORY: Family History  Problem Relation Age of Onset  . Hypertension Mother   . Cardiomyopathy Mother   . Heart disease Mother   . Anxiety disorder Mother   . Obesity Mother   . Hypertension Father   . Heart disease Father   . Hyperlipidemia Father   . Miscarriages / [place] Son   . Hypertension Brother   . Diabetes Maternal Grandmother   . Heart disease Maternal Grandfather   . Hypertension  Maternal Grandfather   . Stroke Maternal Grandfather   . Heart disease Paternal Grandmother   . Hypertension Paternal Grandmother   . Stroke Paternal Grandmother   . Alzheimer's disease Paternal Grandmother   . Hearing loss Neg Hx     ROS: Review of Systems  Constitutional: Positive for weight loss.  Gastrointestinal: Negative for nausea and vomiting.  Musculoskeletal:       Negative for muscle weakness  Neurological: Positive for tingling.  Psychiatric/Behavioral: Positive for depression. Negative for suicidal ideas.       Negative for homicidal ideations    PHYSICAL EXAM: Blood pressure 133/90, pulse 98, temperature 98.2 F (36.8 C), temperature source Oral, height 5\' 7"  (1.702 m), weight 205 lb (93 kg), SpO2 100 %. Body mass index is 32.11 kg/m. Physical Exam  Constitutional: She is oriented to person, place, and time. She appears well-developed and well-nourished.  HENT:  Head: Normocephalic.  Eyes: Pupils are equal, round, and reactive to light.  Neck: Normal range of motion.  Cardiovascular: Normal rate.  Pulmonary/Chest: Effort normal.  Musculoskeletal: Normal range of motion.  Neurological: She is alert and oriented to person, place, and time.  Skin: Skin is warm and dry.  Psychiatric: She has a normal mood and affect. Her behavior is normal.  Vitals reviewed.   RECENT LABS AND TESTS: BMET    Component Value Date/Time   NA 137 08/11/2018 0811   NA 137 12/07/2017 1125   K 4.1 08/11/2018 0811   CL 105 08/11/2018 0811   CO2 24 08/11/2018 0811   GLUCOSE 95 08/11/2018 0811   BUN 10 08/11/2018 0811   BUN 10 12/07/2017 1125   CREATININE 0.81 08/11/2018 0811   CALCIUM 8.8 (L) 08/11/2018 0811   GFRNONAA >60 08/11/2018 0811   GFRAA >60 08/11/2018 0811   Lab Results  Component Value Date   HGBA1C 5.2 09/12/2018   HGBA1C 5.4 12/07/2017   Lab Results  Component Value Date   INSULIN 36.9 (H) 09/12/2018   INSULIN 13.7 12/07/2017   CBC    Component Value  Date/Time   WBC 9.5 08/11/2018 0811   RBC 4.16 08/11/2018 0811   HGB 12.4 08/11/2018 0811   HGB 12.9 12/07/2017 1125   HCT 38.5 08/11/2018 0811   HCT 37.7 12/07/2017 1125   PLT 247 08/11/2018 0811   PLT 251 01/13/2017 1604   MCV 92.5 08/11/2018 0811   MCV 88 12/07/2017 1125   MCH 29.8 08/11/2018 0811   MCHC 32.2 08/11/2018 0811   RDW 13.5 08/11/2018 0811   RDW 14.3 12/07/2017 1125   LYMPHSABS 3.0 12/07/2017  1125   EOSABS 0.2 12/07/2017 1125   BASOSABS 0.0 12/07/2017 1125   Iron/TIBC/Ferritin/ %Sat No results found for: IRON, TIBC, FERRITIN, IRONPCTSAT Lipid Panel     Component Value Date/Time   CHOL 165 09/12/2018 0808   TRIG 66 09/12/2018 0808   HDL 43 09/12/2018 0808   CHOLHDL 3.4 01/13/2017 1604   LDLCALC 109 (H) 09/12/2018 0808   Hepatic Function Panel     Component Value Date/Time   PROT 7.8 12/07/2017 1125   ALBUMIN 4.2 12/07/2017 1125   AST 15 12/07/2017 1125   ALT 15 12/07/2017 1125   ALKPHOS 61 12/07/2017 1125   BILITOT 0.5 12/07/2017 1125      Component Value Date/Time   TSH 0.804 12/07/2017 1125   TSH 1.090 01/13/2017 1604    Ref. Range 09/12/2018 08:08  Vitamin D, 25-Hydroxy Latest Ref Range: 30.0 - 100.0 ng/mL 18.3 (L)    ASSESSMENT AND PLAN: Vitamin D deficiency - Plan: Vitamin D, Ergocalciferol, (DRISDOL) 1.25 MG (50000 UT) CAPS capsule  Other depression - with emotional eating - Plan: topiramate (TOPAMAX) 50 MG tablet  At risk for osteoporosis  Class 1 obesity with serious comorbidity and body mass index (BMI) of 32.0 to 32.9 in adult, unspecified obesity type  PLAN: Vitamin D Deficiency Sindi was informed that low vitamin D levels contributes to fatigue and are associated with obesity, breast, and colon cancer. She agrees to continue to take prescription Vit D @50 ,000 IU every 3 days #10 with no refills and will follow up for routine testing of vitamin D, at least 2-3 times per year. She was informed of the risk of over-replacement of  vitamin D and agrees to not increase her dose unless she discusses this with [place] first. Agrees to follow up with our clinic as directed.   Depression with Emotional Eating Behaviors We discussed behavior modification techniques today to help Raven Walters deal with her emotional eating and depression. She has agreed to continue Topamax 50 mg daily #30 with no refills and agreed to follow up as directed.  At risk for osteopenia and osteoporosis Raven Walters was given extended  (15 minutes) osteoporosis prevention counseling today. Raven Walters is at risk for osteopenia and osteoporosis due to her vitamin D deficiency. She was encouraged to take her vitamin D and follow her higher calcium diet and increase strengthening exercise to help strengthen her bones and decrease her risk of osteopenia and osteoporosis.  Obesity Raven Walters is currently in the action stage of change. As such, her goal is to continue with weight loss efforts She has agreed to follow the Category 3 plan or journal 1500 calories and 100g of protein daily.  Raven Walters has been instructed to continue with cardio and strength training for 30 minutes 3 times per week for weight loss and overall health benefits. We discussed the following Behavioral Modification Strategies today: holiday eating strategies , planning for success, and keeping a strict food journal.    Raven Walters has agreed to follow up with our clinic in 2-3 weeks. She was informed of the importance of frequent follow up visits to maximize her success with intensive lifestyle modifications for her multiple health conditions.   OBESITY BEHAVIORAL INTERVENTION VISIT  Today's visit was # 13   Starting weight: 220 lb Starting date: 12/07/17 Today's weight : Weight: 205 lb (93 kg)  Today's date: 10/18/2018 Total lbs lost to date: 15 lb At least 15 minutes were spent on discussing the following behavioral intervention visit.   ASK: We discussed the diagnosis  of obesity with Raven Walters today  and Raven Walters agreed to give [place] permission to discuss obesity behavioral modification therapy today.  ASSESS: Raven Walters has the diagnosis of obesity and her BMI today is 32.1 Cashae is in the action stage of change   ADVISE: Alizey was educated on the multiple health risks of obesity as well as the benefit of weight loss to improve her health. She was advised of the need for long term treatment and the importance of lifestyle modifications to improve her current health and to decrease her risk of future health problems.  AGREE: Multiple dietary modification options and treatment options were discussed and  Shonnie agreed to follow the recommendations documented in the above note.  ARRANGE: Morena was educated on the importance of frequent visits to treat obesity as outlined per CMS and USPSTF guidelines and agreed to schedule her next follow up appointment today.  I, Jeralene Peters, am acting as Energy manager for [place], FNP-C.  I have reviewed the above documentation for accuracy and completeness, and I agree with the above.  - Callahan Peddie, FNP-C.

## 2018-10-31 ENCOUNTER — Encounter (INDEPENDENT_AMBULATORY_CARE_PROVIDER_SITE_OTHER): Payer: Self-pay | Admitting: Family Medicine

## 2018-10-31 ENCOUNTER — Ambulatory Visit (INDEPENDENT_AMBULATORY_CARE_PROVIDER_SITE_OTHER): Payer: BLUE CROSS/BLUE SHIELD | Admitting: Family Medicine

## 2018-10-31 VITALS — BP 122/82 | HR 90 | Temp 98.0°F | Ht 67.0 in | Wt 206.0 lb

## 2018-10-31 DIAGNOSIS — E559 Vitamin D deficiency, unspecified: Secondary | ICD-10-CM

## 2018-10-31 DIAGNOSIS — Z9189 Other specified personal risk factors, not elsewhere classified: Secondary | ICD-10-CM | POA: Diagnosis not present

## 2018-10-31 DIAGNOSIS — E8881 Metabolic syndrome: Secondary | ICD-10-CM

## 2018-10-31 DIAGNOSIS — Z6832 Body mass index (BMI) 32.0-32.9, adult: Secondary | ICD-10-CM

## 2018-10-31 DIAGNOSIS — E669 Obesity, unspecified: Secondary | ICD-10-CM

## 2018-10-31 MED ORDER — VITAMIN D (ERGOCALCIFEROL) 1.25 MG (50000 UNIT) PO CAPS: 50000.0000 [IU] | ORAL_CAPSULE | ORAL | 0 refills | Status: DC

## 2018-10-31 NOTE — Progress Notes (Signed)
Office: [phone]  /  Fax: [phone]   HPI:   Chief Complaint: OBESITY Raven Walters is here to discuss her progress with her obesity treatment plan. She is on the keep a food journal with 1500 calories and 100 grams of protein daily and follow the Category 3 plan and is following her eating plan approximately 85 % of the time. She states she is doing cardio and strengthening for 20 minutes 3 times per week. Raven Walters is retaining a bit of water. She has done well minimizing weight gain over the holidays.  Her weight is 206 lb (93.4 kg) today and has gained 1 pound since her last visit. She has lost 14 lbs since starting treatment with Raven Walters.  Insulin Resistance Raven Walters has a diagnosis of insulin resistance based on her elevated fasting insulin level >5. Although Raven Walters's blood glucose readings are still under good control, insulin resistance puts her at greater risk of metabolic syndrome and diabetes. She is stable on metformin and denies nausea, vomiting, or hypoglycemia. She is working on diet and exercise to decrease risk of diabetes.  At risk for diabetes Raven Walters is at higher than average risk for developing diabetes due to her obesity and insulin resistance. She currently denies polyuria or polydipsia.  Vitamin D Deficiency Raven Walters has a diagnosis of vitamin D deficiency. She is stable on prescription Vit D, but level is not yet at goal. She denies nausea, vomiting or muscle weakness.  ASSESSMENT AND PLAN:  Vitamin D deficiency - Plan: Vitamin D, Ergocalciferol, (DRISDOL) 1.25 MG (50000 UT) CAPS capsule  Insulin resistance  At risk for diabetes mellitus  Class 1 obesity with serious comorbidity and body mass index (BMI) of 32.0 to 32.9 in adult, unspecified obesity type  PLAN:  Insulin Resistance Raven Walters will continue to work on weight loss, diet, exercise, and decreasing simple carbohydrates in her diet to help decrease the risk of diabetes. We dicussed metformin including benefits  and risks. She was informed that eating too many simple carbohydrates or too many calories at one sitting increases the likelihood of GI side effects. Raven Walters agrees to continue taking metformin and we will recheck labs next month. Raven Walters agrees to follow up with our clinic in 2 to 3 weeks as directed to monitor her progress.  Diabetes risk counselling Raven Walters was given extended (15 minutes) diabetes prevention counseling today. She is 32 y.o. female and has risk factors for diabetes including obesity and insulin resistance. We discussed intensive lifestyle modifications today with an emphasis on weight loss as well as increasing exercise and decreasing simple carbohydrates in her diet.  Vitamin D Deficiency Raven Walters was informed that low vitamin D levels contributes to fatigue and are associated with obesity, breast, and colon cancer. Raven Walters agrees to continue taking prescription Vit D @50 ,000 IU every 3 days #10 and we will refill for 1 month. She will follow up for routine testing of vitamin D, at least 2-3 times per year. She was informed of the risk of over-replacement of vitamin D and agrees to not increase her dose unless she discusses this with Raven Walters first. Raven Walters agrees to follow up with our clinic in 2 to 3 weeks.  Obesity Raven Walters is currently in the action stage of change. As such, her goal is to continue with weight loss efforts She has agreed to keep a food journal with 1500 calories and 90+ grams of protein daily Raven Walters has been instructed to work up to a goal of 150 minutes of combined cardio and strengthening exercise  per week for weight loss and overall health benefits. We discussed the following Behavioral Modification Strategies today: increasing lean protein intake, decreasing simple carbohydrates , work on meal planning and easy cooking plans, holiday eating strategies, and celebration eating strategies   Raven Walters has agreed to follow up with our clinic in 2 to 3 weeks. She was informed of  the importance of frequent follow up visits to maximize her success with intensive lifestyle modifications for her multiple health conditions.  ALLERGIES: No Known Allergies  MEDICATIONS: Current Outpatient Medications on File Prior to Visit  Medication Sig Dispense Refill  . escitalopram (LEXAPRO) 10 MG tablet TAKE 1/2 TABLET FOR 3 DAYS THEN INCREASE TO ONE TABLET BY MOUTH DAILY 90 tablet 3  . fluticasone (FLONASE) 50 MCG/ACT nasal spray Place 2 sprays into both nostrils daily. 16 g 6  . labetalol (NORMODYNE) 100 MG tablet Take 1 tablet (100 mg total) by mouth 2 (two) times daily. 180 tablet 1  . levonorgestrel (MIRENA, 52 MG,) 20 MCG/24HR IUD 1 Device by Intrauterine route once. [id number]    . metFORMIN (GLUCOPHAGE) 500 MG tablet Take 1 tablet (500 mg total) by mouth 2 (two) times daily with a meal. 60 tablet 0  . topiramate (TOPAMAX) 50 MG tablet Take 1 tablet (50 mg total) by mouth daily. 30 tablet 0   No current facility-administered medications on file prior to visit.     PAST MEDICAL HISTORY: Past Medical History:  Diagnosis Date  . Anxiety   . Asthma    pt states it is induced by excercise  . Chest pain 02/07/2017  . Dyspnea   . Essential hypertension 02/07/2017  . Headache   . Hx MRSA infection   . Infection    UTI  . Palpitations 02/07/2017  . Preeclampsia   . PVC (premature ventricular contraction) 03/18/2017    PAST SURGICAL HISTORY: Past Surgical History:  Procedure Laterality Date  . NO PAST SURGERIES      SOCIAL HISTORY: Social History   Tobacco Use  . Smoking status: Never Smoker  . Smokeless tobacco: Never Used  Substance Use Topics  . Alcohol use: Yes    Comment: social  . Drug use: No    FAMILY HISTORY: Family History  Problem Relation Age of Onset  . Hypertension Mother   . Cardiomyopathy Mother   . Heart disease Mother   . Anxiety disorder Mother   . Obesity Mother   . Hypertension Father   . Heart disease Father   . Hyperlipidemia Father     . Miscarriages / [place] Son   . Hypertension Brother   . Diabetes Maternal Grandmother   . Heart disease Maternal Grandfather   . Hypertension Maternal Grandfather   . Stroke Maternal Grandfather   . Heart disease Paternal Grandmother   . Hypertension Paternal Grandmother   . Stroke Paternal Grandmother   . Alzheimer's disease Paternal Grandmother   . Hearing loss Neg Hx     ROS: Review of Systems  Constitutional: Negative for weight loss.  Gastrointestinal: Negative for nausea and vomiting.  Genitourinary: Negative for frequency.  Musculoskeletal:       Negative muscle weakness  Endo/Heme/Allergies: Negative for polydipsia.       Negative hypoglycemia    PHYSICAL EXAM: Blood pressure 122/82, pulse 90, temperature 98 F (36.7 C), temperature source Oral, height 5\' 7"  (1.702 m), weight 206 lb (93.4 kg), SpO2 99 %. Body mass index is 32.26 kg/m. Physical Exam Vitals signs reviewed.  Constitutional:  Appearance: Normal appearance. She is obese.  Cardiovascular:     Rate and Rhythm: Normal rate.     Pulses: Normal pulses.  Pulmonary:     Effort: Pulmonary effort is normal.  Musculoskeletal: Normal range of motion.  Skin:    General: Skin is warm and dry.  Neurological:     Mental Status: She is alert and oriented to person, place, and time.  Psychiatric:        Mood and Affect: Mood normal.        Behavior: Behavior normal.     RECENT LABS AND TESTS: BMET    Component Value Date/Time   NA 137 08/11/2018 0811   NA 137 12/07/2017 1125   K 4.1 08/11/2018 0811   CL 105 08/11/2018 0811   CO2 24 08/11/2018 0811   GLUCOSE 95 08/11/2018 0811   BUN 10 08/11/2018 0811   BUN 10 12/07/2017 1125   CREATININE 0.81 08/11/2018 0811   CALCIUM 8.8 (L) 08/11/2018 0811   GFRNONAA >60 08/11/2018 0811   GFRAA >60 08/11/2018 0811   Lab Results  Component Value Date   HGBA1C 5.2 09/12/2018   HGBA1C 5.4 12/07/2017   Lab Results  Component Value Date   INSULIN  36.9 (H) 09/12/2018   INSULIN 13.7 12/07/2017   CBC    Component Value Date/Time   WBC 9.5 08/11/2018 0811   RBC 4.16 08/11/2018 0811   HGB 12.4 08/11/2018 0811   HGB 12.9 12/07/2017 1125   HCT 38.5 08/11/2018 0811   HCT 37.7 12/07/2017 1125   PLT 247 08/11/2018 0811   PLT 251 01/13/2017 1604   MCV 92.5 08/11/2018 0811   MCV 88 12/07/2017 1125   MCH 29.8 08/11/2018 0811   MCHC 32.2 08/11/2018 0811   RDW 13.5 08/11/2018 0811   RDW 14.3 12/07/2017 1125   LYMPHSABS 3.0 12/07/2017 1125   EOSABS 0.2 12/07/2017 1125   BASOSABS 0.0 12/07/2017 1125   Iron/TIBC/Ferritin/ %Sat No results found for: IRON, TIBC, FERRITIN, IRONPCTSAT Lipid Panel     Component Value Date/Time   CHOL 165 09/12/2018 0808   TRIG 66 09/12/2018 0808   HDL 43 09/12/2018 0808   CHOLHDL 3.4 01/13/2017 1604   LDLCALC 109 (H) 09/12/2018 0808   Hepatic Function Panel     Component Value Date/Time   PROT 7.8 12/07/2017 1125   ALBUMIN 4.2 12/07/2017 1125   AST 15 12/07/2017 1125   ALT 15 12/07/2017 1125   ALKPHOS 61 12/07/2017 1125   BILITOT 0.5 12/07/2017 1125      Component Value Date/Time   TSH 0.804 12/07/2017 1125   TSH 1.090 01/13/2017 1604      OBESITY BEHAVIORAL INTERVENTION VISIT  Today's visit was # 14   Starting weight: 220 lbs Starting date: 12/07/17 Today's weight : 206 lbs Today's date: 10/31/2018 Total lbs lost to date: 14    ASK: We discussed the diagnosis of obesity with Raven Walters today and Nona agreed to give [place] permission to discuss obesity behavioral modification therapy today.  ASSESS: Yanisa has the diagnosis of obesity and her BMI today is 32.26 Colleena is in the action stage of change   ADVISE: Josiane was educated on the multiple health risks of obesity as well as the benefit of weight loss to improve her health. She was advised of the need for long term treatment and the importance of lifestyle modifications to improve her current health and to decrease her  risk of future health problems.  AGREE: Multiple dietary  modification options and treatment options were discussed and  Raven Walters agreed to follow the recommendations documented in the above note.  ARRANGE: Raven Walters was educated on the importance of frequent visits to treat obesity as outlined per CMS and USPSTF guidelines and agreed to schedule her next follow up appointment today.  I, Burt KnackSharon Martin, am acting as transcriptionist for Raven Walters Tniyah Nakagawa, MD  I have reviewed the above documentation for accuracy and completeness, and I agree with the above. -Raven Walters Raven Donati, MD

## 2018-11-21 ENCOUNTER — Ambulatory Visit (INDEPENDENT_AMBULATORY_CARE_PROVIDER_SITE_OTHER): Payer: BLUE CROSS/BLUE SHIELD | Admitting: Physician Assistant

## 2018-11-21 ENCOUNTER — Encounter (INDEPENDENT_AMBULATORY_CARE_PROVIDER_SITE_OTHER): Payer: Self-pay | Admitting: Physician Assistant

## 2018-11-21 VITALS — BP 105/72 | HR 78 | Temp 98.2°F | Ht 67.0 in | Wt 202.0 lb

## 2018-11-21 DIAGNOSIS — E669 Obesity, unspecified: Secondary | ICD-10-CM

## 2018-11-21 DIAGNOSIS — Z6831 Body mass index (BMI) 31.0-31.9, adult: Secondary | ICD-10-CM

## 2018-11-21 DIAGNOSIS — Z9189 Other specified personal risk factors, not elsewhere classified: Secondary | ICD-10-CM | POA: Diagnosis not present

## 2018-11-21 DIAGNOSIS — E8881 Metabolic syndrome: Secondary | ICD-10-CM

## 2018-11-21 DIAGNOSIS — F3289 Other specified depressive episodes: Secondary | ICD-10-CM | POA: Diagnosis not present

## 2018-11-21 MED ORDER — TOPIRAMATE 50 MG PO TABS: 50.0000 mg | ORAL_TABLET | Freq: Every day | ORAL | 0 refills | Status: DC

## 2018-11-21 MED ORDER — METFORMIN HCL 500 MG PO TABS: 500.0000 mg | ORAL_TABLET | Freq: Two times a day (BID) | ORAL | 0 refills | Status: DC

## 2018-11-21 NOTE — Progress Notes (Signed)
Office: [phone]  /  Fax: [phone]   HPI:   Chief Complaint: OBESITY Raven Walters is here to discuss her progress with her obesity treatment plan. She is on the Category 3 plan and is following her eating plan approximately 85 % of the time. She states she is strengthening for 15 minutes and cardio for 15 minutes 2 times per week. Raven Walters did well with weight loss. She reports being more focused in the last 2 weeks.  Her weight is 202 lb (91.6 kg) today and has had a weight loss of 4 pounds over a period of 3 weeks since her last visit. She has lost 18 lbs since starting treatment with [place].  Insulin Resistance Raven Walters has a diagnosis of insulin resistance based on her elevated fasting insulin level >5. Although Raven Walters's blood glucose readings are still under good control, insulin resistance puts her at greater risk of metabolic syndrome and diabetes. She denies nausea, vomiting, or diarrhea on metformin. She continues to work on diet and exercise to decrease risk of diabetes. She denies polyphagia or hypoglycemia.  At risk for diabetes Raven Walters is at higher than average risk for developing diabetes due to her obesity and insulin resistance. She currently denies polyuria or polydipsia.  Depression with emotional eating behaviors Raven Walters states Topamax helps with cravings. Raven Walters struggles with emotional eating and using food for comfort to the extent that it is negatively impacting her health. She often snacks when she is not hungry. Raven Walters sometimes feels she is out of control and then feels guilty that she made poor food choices. She has been working on behavior modification techniques to help reduce her emotional eating and has been somewhat successful. She shows no sign of suicidal or homicidal ideations.  Depression screen [facility] 2/9 06/22/2018 02/16/2018 12/07/2017 06/25/2017 06/18/2017  Decreased Interest 0 0 3 0 0  Down, Depressed, Hopeless 0 0 1 0 0  PHQ - 2 Score 0 0 4 0 0  Altered sleeping -  - 3 - -  Tired, decreased energy - - 3 - -  Change in appetite - - 3 - -  Feeling bad or failure about yourself  - - 2 - -  Trouble concentrating - - 3 - -  Moving slowly or fidgety/restless - - 1 - -  Suicidal thoughts - - 0 - -  PHQ-9 Score - - 19 - -  Difficult doing work/chores - - Not difficult at all - -    ALLERGIES: No Known Allergies  MEDICATIONS: Current Outpatient Medications on File Prior to Visit  Medication Sig Dispense Refill  . escitalopram (LEXAPRO) 10 MG tablet TAKE 1/2 TABLET FOR 3 DAYS THEN INCREASE TO ONE TABLET BY MOUTH DAILY 90 tablet 3  . fluticasone (FLONASE) 50 MCG/ACT nasal spray Place 2 sprays into both nostrils daily. 16 g 6  . labetalol (NORMODYNE) 100 MG tablet Take 1 tablet (100 mg total) by mouth 2 (two) times daily. 180 tablet 1  . levonorgestrel (MIRENA, 52 MG,) 20 MCG/24HR IUD 1 Device by Intrauterine route once. [id number]    . Vitamin D, Ergocalciferol, (DRISDOL) 1.25 MG (50000 UT) CAPS capsule Take 1 capsule (50,000 Units total) by mouth every 3 (three) days. 10 capsule 0   No current facility-administered medications on file prior to visit.     PAST MEDICAL HISTORY: Past Medical History:  Diagnosis Date  . Anxiety   . Asthma    pt states it is induced by excercise  . Chest pain 02/07/2017  .  Dyspnea   . Essential hypertension 02/07/2017  . Headache   . Hx MRSA infection   . Infection    UTI  . Palpitations 02/07/2017  . Preeclampsia   . PVC (premature ventricular contraction) 03/18/2017    PAST SURGICAL HISTORY: Past Surgical History:  Procedure Laterality Date  . NO PAST SURGERIES      SOCIAL HISTORY: Social History   Tobacco Use  . Smoking status: Never Smoker  . Smokeless tobacco: Never Used  Substance Use Topics  . Alcohol use: Yes    Comment: social  . Drug use: No    FAMILY HISTORY: Family History  Problem Relation Age of Onset  . Hypertension Mother   . Cardiomyopathy Mother   . Heart disease Mother   . Anxiety  disorder Mother   . Obesity Mother   . Hypertension Father   . Heart disease Father   . Hyperlipidemia Father   . Miscarriages / [place] Son   . Hypertension Brother   . Diabetes Maternal Grandmother   . Heart disease Maternal Grandfather   . Hypertension Maternal Grandfather   . Stroke Maternal Grandfather   . Heart disease Paternal Grandmother   . Hypertension Paternal Grandmother   . Stroke Paternal Grandmother   . Alzheimer's disease Paternal Grandmother   . Hearing loss Neg Hx     ROS: Review of Systems  Constitutional: Positive for weight loss.  Gastrointestinal: Negative for diarrhea, nausea and vomiting.  Genitourinary: Negative for frequency.  Endo/Heme/Allergies: Negative for polydipsia.       Negative polyphagia Negative hypoglycemia  Psychiatric/Behavioral: Positive for depression. Negative for suicidal ideas.    PHYSICAL EXAM: Blood pressure 105/72, pulse 78, temperature 98.2 F (36.8 C), temperature source Oral, height 5\' 7"  (1.702 m), weight 202 lb (91.6 kg), SpO2 100 %. Body mass index is 31.64 kg/m. Physical Exam Vitals signs reviewed.  Constitutional:      Appearance: Normal appearance. She is obese.  Cardiovascular:     Rate and Rhythm: Normal rate.     Pulses: Normal pulses.  Pulmonary:     Effort: Pulmonary effort is normal.     Breath sounds: Normal breath sounds.  Musculoskeletal: Normal range of motion.  Skin:    General: Skin is warm and dry.  Neurological:     Mental Status: She is alert and oriented to person, place, and time.  Psychiatric:        Mood and Affect: Mood normal.        Behavior: Behavior normal.     RECENT LABS AND TESTS: BMET    Component Value Date/Time   NA 137 08/11/2018 0811   NA 137 12/07/2017 1125   K 4.1 08/11/2018 0811   CL 105 08/11/2018 0811   CO2 24 08/11/2018 0811   GLUCOSE 95 08/11/2018 0811   BUN 10 08/11/2018 0811   BUN 10 12/07/2017 1125   CREATININE 0.81 08/11/2018 0811   CALCIUM 8.8  (L) 08/11/2018 0811   GFRNONAA >60 08/11/2018 0811   GFRAA >60 08/11/2018 0811   Lab Results  Component Value Date   HGBA1C 5.2 09/12/2018   HGBA1C 5.4 12/07/2017   Lab Results  Component Value Date   INSULIN 36.9 (H) 09/12/2018   INSULIN 13.7 12/07/2017   CBC    Component Value Date/Time   WBC 9.5 08/11/2018 0811   RBC 4.16 08/11/2018 0811   HGB 12.4 08/11/2018 0811   HGB 12.9 12/07/2017 1125   HCT 38.5 08/11/2018 0811   HCT 37.7 12/07/2017  1125   PLT 247 08/11/2018 0811   PLT 251 01/13/2017 1604   MCV 92.5 08/11/2018 0811   MCV 88 12/07/2017 1125   MCH 29.8 08/11/2018 0811   MCHC 32.2 08/11/2018 0811   RDW 13.5 08/11/2018 0811   RDW 14.3 12/07/2017 1125   LYMPHSABS 3.0 12/07/2017 1125   EOSABS 0.2 12/07/2017 1125   BASOSABS 0.0 12/07/2017 1125   Iron/TIBC/Ferritin/ %Sat No results found for: IRON, TIBC, FERRITIN, IRONPCTSAT Lipid Panel     Component Value Date/Time   CHOL 165 09/12/2018 0808   TRIG 66 09/12/2018 0808   HDL 43 09/12/2018 0808   CHOLHDL 3.4 01/13/2017 1604   LDLCALC 109 (H) 09/12/2018 0808   Hepatic Function Panel     Component Value Date/Time   PROT 7.8 12/07/2017 1125   ALBUMIN 4.2 12/07/2017 1125   AST 15 12/07/2017 1125   ALT 15 12/07/2017 1125   ALKPHOS 61 12/07/2017 1125   BILITOT 0.5 12/07/2017 1125      Component Value Date/Time   TSH 0.804 12/07/2017 1125   TSH 1.090 01/13/2017 1604    ASSESSMENT AND PLAN: Insulin resistance - Plan: metFORMIN (GLUCOPHAGE) 500 MG tablet  Other depression - with emotional eating - Plan: topiramate (TOPAMAX) 50 MG tablet  At risk for diabetes mellitus  Class 1 obesity with serious comorbidity and body mass index (BMI) of 31.0 to 31.9 in adult, unspecified obesity type  PLAN:  Insulin Resistance Raven Walters will continue to work on weight loss, exercise, and decreasing simple carbohydrates in her diet to help decrease the risk of diabetes. We dicussed metformin including benefits and risks.  She was informed that eating too many simple carbohydrates or too many calories at one sitting increases the likelihood of GI side effects. Shariah agrees to continue taking metformin 500 mg BID #60 and we will refill for 1 month. Raven Walters agrees to follow up with our clinic in 2 weeks as directed to monitor her progress.  Diabetes risk counselling Raven Walters was given extended (15 minutes) diabetes prevention counseling today. She is 33 y.o. female and has risk factors for diabetes including obesity and insulin resistance. We discussed intensive lifestyle modifications today with an emphasis on weight loss as well as increasing exercise and decreasing simple carbohydrates in her diet.  Depression with Emotional Eating Behaviors We discussed behavior modification techniques today to help Raven Walters deal with her emotional eating and depression. Raven Walters agrees to continue taking Topamax 50 mg qd #30 and we will refill for 1 month. Raven Walters agrees to follow up with our clinic in 2 weeks.  Obesity Raven Walters is currently in the action stage of change. As such, her goal is to continue with weight loss efforts She has agreed to follow the Category 3 plan Raven Walters has been instructed to work up to a goal of 150 minutes of combined cardio and strengthening exercise per week for weight loss and overall health benefits. We discussed the following Behavioral Modification Strategies today: work on meal planning and easy cooking plans and planning for success   Raven Walters has agreed to follow up with our clinic in 2 weeks. She was informed of the importance of frequent follow up visits to maximize her success with intensive lifestyle modifications for her multiple health conditions.   OBESITY BEHAVIORAL INTERVENTION VISIT  Today's visit was # 15  Starting weight: 220 lbs Starting date: 12/07/17 Today's weight : 202 lbs Today's date: 11/21/2018 Total lbs lost to date: 18    ASK: We discussed the diagnosis of  obesity with  Raven Walters today and Raven Walters agreed to give us permission to discuss obesity behavioral modification therapy today.  ASSESS: Raven Walters has the diagnosis of obesity and her BMI today is 31.63 Raven Walters is in the action stage of change   ADVISE: Raven Walters was educated on the multiple health risks of obesity as well as the benefit of weight loss to improve her health. She was advised of the need for long term treatment and the importance of lifestyle modifications.  AGREE: Multiple dietary modification options and treatment options were discussed and  [place] agreed to the above obesity treatment plan.  Trude McburneyI, Sharon Martin, am acting as transcriptionist for Alois Clicheracey Sharece Fleischhacker, PA-C I, Alois Clicheracey Jerome Otter, PA-C have reviewed above note and agree with its content

## 2018-12-07 ENCOUNTER — Ambulatory Visit (INDEPENDENT_AMBULATORY_CARE_PROVIDER_SITE_OTHER): Payer: BLUE CROSS/BLUE SHIELD | Admitting: Physician Assistant

## 2018-12-07 ENCOUNTER — Encounter (INDEPENDENT_AMBULATORY_CARE_PROVIDER_SITE_OTHER): Payer: Self-pay | Admitting: Physician Assistant

## 2018-12-07 VITALS — BP 136/87 | HR 87 | Temp 98.3°F | Ht 67.0 in | Wt 199.0 lb

## 2018-12-07 DIAGNOSIS — Z6831 Body mass index (BMI) 31.0-31.9, adult: Secondary | ICD-10-CM

## 2018-12-07 DIAGNOSIS — E8881 Metabolic syndrome: Secondary | ICD-10-CM

## 2018-12-07 DIAGNOSIS — E559 Vitamin D deficiency, unspecified: Secondary | ICD-10-CM | POA: Diagnosis not present

## 2018-12-07 DIAGNOSIS — E669 Obesity, unspecified: Secondary | ICD-10-CM | POA: Diagnosis not present

## 2018-12-07 DIAGNOSIS — Z9189 Other specified personal risk factors, not elsewhere classified: Secondary | ICD-10-CM

## 2018-12-07 MED ORDER — VITAMIN D (ERGOCALCIFEROL) 1.25 MG (50000 UNIT) PO CAPS: 50000.0000 [IU] | ORAL_CAPSULE | ORAL | 0 refills | Status: DC

## 2018-12-07 NOTE — Progress Notes (Signed)
Office: [phone]  /  Fax: [phone]   HPI:   Chief Complaint: OBESITY Raven Walters is here to discuss her progress with her obesity treatment plan. She is on the Category 3 plan and is following her eating plan approximately 80 % of the time. She states she is doing cardio 30 minutes 3 times per week. Raven Walters did well with weight loss. She states that she has made up her mind to stay on track the last 2 weeks. She is asking about fasting for church today. Her weight is 199 lb (90.3 kg) today and has had a weight loss of 3 pounds over a period of 2 weeks since her last visit. She has lost 21 lbs since starting treatment with Raven Walters.  Vitamin D deficiency Raven Walters has a diagnosis of vitamin D deficiency. She is currently taking prescription Vit D and denies nausea, vomiting or muscle weakness.  Insulin Resistance Raven Walters has a diagnosis of insulin resistance based on her elevated fasting insulin level >5. Although Raven Walters's blood glucose readings are still under good control, insulin resistance puts her at greater risk of metabolic syndrome and diabetes. She is taking metformin currently and continues to work on diet and exercise to decrease risk of diabetes. She denies nausea, vomiting or diarrhea.  At risk for osteopenia and osteoporosis Raven Walters is at higher risk of osteopenia and osteoporosis due to vitamin D deficiency.    ASSESSMENT AND PLAN:  Vitamin D deficiency - Plan: Vitamin D, Ergocalciferol, (DRISDOL) 1.25 MG (50000 UT) CAPS capsule  Insulin resistance  At risk for osteoporosis  Class 1 obesity with serious comorbidity and body mass index (BMI) of 31.0 to 31.9 in adult, unspecified obesity type  PLAN:  Vitamin D Deficiency Raven Walters was informed that low vitamin D levels contributes to fatigue and are associated with obesity, breast, and colon cancer. She agrees to continue to take prescription Vit D @50 ,000 IU every 3 days #10 with no refills and will follow up for routine testing  of vitamin D, at least 2-3 times per year. She was informed of the risk of over-replacement of vitamin D and agrees to not increase her dose unless she discusses this with Raven Walters first. Raven Walters agrees to follow up with our clinic in 3 weeks.  Insulin Resistance Raven Walters will continue to work on weight loss, exercise, and decreasing simple carbohydrates in her diet to help decrease the risk of diabetes. We dicussed metformin including benefits and risks. She was informed that eating too many simple carbohydrates or too many calories at one sitting increases the likelihood of GI side effects. Raven Walters agrees to continue taking metformin for now and prescription was not written today. Raven Walters agrees to follow up with our clinic in 3 weeks.  At risk for osteopenia and osteoporosis Raven Walters was given extended  (15 minutes) osteoporosis prevention counseling today. Raven Walters is at risk for osteopenia and osteoporosis due to her vitamin D deficiency. She was encouraged to take her vitamin D and follow her higher calcium diet and increase strengthening exercise to help strengthen her bones and decrease her risk of osteopenia and osteoporosis.  Obesity Raven Walters is currently in the action stage of change. As such, her goal is to continue with weight loss efforts She has agreed to follow the Category 3 plan Raven Walters has been instructed to work up to a goal of 150 minutes of combined cardio and strengthening exercise per week for weight loss and overall health benefits. We discussed the following Behavioral Modification Strategies today: work on meal  planning and easy cooking plans and planning for success  Raven Walters has agreed to follow up with our clinic in 3 weeks. She was informed of the importance of frequent follow up visits to maximize her success with intensive lifestyle modifications for her multiple health conditions.  ALLERGIES: No Known Allergies  MEDICATIONS: Current Outpatient Medications on File Prior to Visit    Medication Sig Dispense Refill  . escitalopram (LEXAPRO) 10 MG tablet TAKE 1/2 TABLET FOR 3 DAYS THEN INCREASE TO ONE TABLET BY MOUTH DAILY 90 tablet 3  . fluticasone (FLONASE) 50 MCG/ACT nasal spray Place 2 sprays into both nostrils daily. 16 g 6  . labetalol (NORMODYNE) 100 MG tablet Take 1 tablet (100 mg total) by mouth 2 (two) times daily. 180 tablet 1  . levonorgestrel (MIRENA, 52 MG,) 20 MCG/24HR IUD 1 Device by Intrauterine route once. [id number]    . metFORMIN (GLUCOPHAGE) 500 MG tablet Take 1 tablet (500 mg total) by mouth 2 (two) times daily with a meal. 60 tablet 0  . topiramate (TOPAMAX) 50 MG tablet Take 1 tablet (50 mg total) by mouth daily. 30 tablet 0   No current facility-administered medications on file prior to visit.     PAST MEDICAL HISTORY: Past Medical History:  Diagnosis Date  . Anxiety   . Asthma    pt states it is induced by excercise  . Chest pain 02/07/2017  . Dyspnea   . Essential hypertension 02/07/2017  . Headache   . Hx MRSA infection   . Infection    UTI  . Palpitations 02/07/2017  . Preeclampsia   . PVC (premature ventricular contraction) 03/18/2017    PAST SURGICAL HISTORY: Past Surgical History:  Procedure Laterality Date  . NO PAST SURGERIES      SOCIAL HISTORY: Social History   Tobacco Use  . Smoking status: Never Smoker  . Smokeless tobacco: Never Used  Substance Use Topics  . Alcohol use: Yes    Comment: social  . Drug use: No    FAMILY HISTORY: Family History  Problem Relation Age of Onset  . Hypertension Mother   . Cardiomyopathy Mother   . Heart disease Mother   . Anxiety disorder Mother   . Obesity Mother   . Hypertension Father   . Heart disease Father   . Hyperlipidemia Father   . Miscarriages / [place] Son   . Hypertension Brother   . Diabetes Maternal Grandmother   . Heart disease Maternal Grandfather   . Hypertension Maternal Grandfather   . Stroke Maternal Grandfather   . Heart disease Paternal Grandmother    . Hypertension Paternal Grandmother   . Stroke Paternal Grandmother   . Alzheimer's disease Paternal Grandmother   . Hearing loss Neg Hx     ROS: Review of Systems  Constitutional: Positive for weight loss.  Gastrointestinal: Negative for nausea and vomiting.  Genitourinary: Negative for frequency.  Musculoskeletal:       Negative for muscle weakness  Endo/Heme/Allergies: Negative for polydipsia.    PHYSICAL EXAM: Blood pressure 136/87, pulse 87, temperature 98.3 F (36.8 C), temperature source Oral, height 5\' 7"  (1.702 m), weight 199 lb (90.3 kg), SpO2 99 %. Body mass index is 31.17 kg/m. Physical Exam Vitals signs reviewed.  Constitutional:      Appearance: Normal appearance. She is obese.  Cardiovascular:     Rate and Rhythm: Normal rate.     Pulses: Normal pulses.  Pulmonary:     Effort: Pulmonary effort is normal.  Musculoskeletal: Normal range  of motion.  Skin:    General: Skin is warm and dry.  Neurological:     Mental Status: She is alert and oriented to person, place, and time.  Psychiatric:        Mood and Affect: Mood normal.        Behavior: Behavior normal.     RECENT LABS AND TESTS: BMET    Component Value Date/Time   NA 137 08/11/2018 0811   NA 137 12/07/2017 1125   K 4.1 08/11/2018 0811   CL 105 08/11/2018 0811   CO2 24 08/11/2018 0811   GLUCOSE 95 08/11/2018 0811   BUN 10 08/11/2018 0811   BUN 10 12/07/2017 1125   CREATININE 0.81 08/11/2018 0811   CALCIUM 8.8 (L) 08/11/2018 0811   GFRNONAA >60 08/11/2018 0811   GFRAA >60 08/11/2018 0811   Lab Results  Component Value Date   HGBA1C 5.2 09/12/2018   HGBA1C 5.4 12/07/2017   Lab Results  Component Value Date   INSULIN 36.9 (H) 09/12/2018   INSULIN 13.7 12/07/2017   CBC    Component Value Date/Time   WBC 9.5 08/11/2018 0811   RBC 4.16 08/11/2018 0811   HGB 12.4 08/11/2018 0811   HGB 12.9 12/07/2017 1125   HCT 38.5 08/11/2018 0811   HCT 37.7 12/07/2017 1125   PLT 247 08/11/2018  0811   PLT 251 01/13/2017 1604   MCV 92.5 08/11/2018 0811   MCV 88 12/07/2017 1125   MCH 29.8 08/11/2018 0811   MCHC 32.2 08/11/2018 0811   RDW 13.5 08/11/2018 0811   RDW 14.3 12/07/2017 1125   LYMPHSABS 3.0 12/07/2017 1125   EOSABS 0.2 12/07/2017 1125   BASOSABS 0.0 12/07/2017 1125   Iron/TIBC/Ferritin/ %Sat No results found for: IRON, TIBC, FERRITIN, IRONPCTSAT Lipid Panel     Component Value Date/Time   CHOL 165 09/12/2018 0808   TRIG 66 09/12/2018 0808   HDL 43 09/12/2018 0808   CHOLHDL 3.4 01/13/2017 1604   LDLCALC 109 (H) 09/12/2018 0808   Hepatic Function Panel     Component Value Date/Time   PROT 7.8 12/07/2017 1125   ALBUMIN 4.2 12/07/2017 1125   AST 15 12/07/2017 1125   ALT 15 12/07/2017 1125   ALKPHOS 61 12/07/2017 1125   BILITOT 0.5 12/07/2017 1125      Component Value Date/Time   TSH 0.804 12/07/2017 1125   TSH 1.090 01/13/2017 1604    Ref. Range 09/12/2018 08:08  Vitamin D, 25-Hydroxy Latest Ref Range: 30.0 - 100.0 ng/mL 18.3 (L)     OBESITY BEHAVIORAL INTERVENTION VISIT  Today's visit was # 16   Starting weight: 220 lbs Starting date: 12/07/2017 Today's weight :: 199 lbs Today's date: 12/07/2018 Total lbs lost to date: 21   ASK: We discussed the diagnosis of obesity with Raven Walters today and Raven Walters agreed to give [place] permission to discuss obesity behavioral modification therapy today.  ASSESS: Raven Walters has the diagnosis of obesity and her BMI today is 31.16 Raven Walters is in the action stage of change   ADVISE: Raven Walters was educated on the multiple health risks of obesity as well as the benefit of weight loss to improve her health. She was advised of the need for long term treatment and the importance of lifestyle modifications to improve her current health and to decrease her risk of future health problems.  AGREE: Multiple dietary modification options and treatment options were discussed and  Raven Walters agreed to follow the recommendations  documented in the above note.  ARRANGE: Raven Walters was educated on the importance of frequent visits to treat obesity as outlined per CMS and USPSTF guidelines and agreed to schedule her next follow up appointment today.  I, Tammy Wysor, am acting as Energy manager for [employer] I, Alois Cliche, PA-C have reviewed above note and agree with its content

## 2018-12-16 ENCOUNTER — Other Ambulatory Visit (INDEPENDENT_AMBULATORY_CARE_PROVIDER_SITE_OTHER): Payer: Self-pay | Admitting: Physician Assistant

## 2018-12-16 DIAGNOSIS — E8881 Metabolic syndrome: Secondary | ICD-10-CM

## 2018-12-18 ENCOUNTER — Other Ambulatory Visit (INDEPENDENT_AMBULATORY_CARE_PROVIDER_SITE_OTHER): Payer: Self-pay | Admitting: Physician Assistant

## 2018-12-18 ENCOUNTER — Other Ambulatory Visit (INDEPENDENT_AMBULATORY_CARE_PROVIDER_SITE_OTHER): Payer: Self-pay | Admitting: Family Medicine

## 2018-12-18 DIAGNOSIS — E559 Vitamin D deficiency, unspecified: Secondary | ICD-10-CM

## 2018-12-18 DIAGNOSIS — F3289 Other specified depressive episodes: Secondary | ICD-10-CM

## 2018-12-21 ENCOUNTER — Other Ambulatory Visit (INDEPENDENT_AMBULATORY_CARE_PROVIDER_SITE_OTHER): Payer: Self-pay | Admitting: Physician Assistant

## 2018-12-21 DIAGNOSIS — E559 Vitamin D deficiency, unspecified: Secondary | ICD-10-CM

## 2018-12-22 ENCOUNTER — Encounter (INDEPENDENT_AMBULATORY_CARE_PROVIDER_SITE_OTHER): Payer: Self-pay | Admitting: Family Medicine

## 2018-12-22 ENCOUNTER — Other Ambulatory Visit (INDEPENDENT_AMBULATORY_CARE_PROVIDER_SITE_OTHER): Payer: Self-pay | Admitting: Physician Assistant

## 2018-12-22 ENCOUNTER — Ambulatory Visit (INDEPENDENT_AMBULATORY_CARE_PROVIDER_SITE_OTHER): Payer: BC Managed Care – PPO | Admitting: Family Medicine

## 2018-12-22 VITALS — BP 123/85 | HR 100 | Temp 98.7°F | Ht 67.0 in | Wt 195.0 lb

## 2018-12-22 DIAGNOSIS — E559 Vitamin D deficiency, unspecified: Secondary | ICD-10-CM

## 2018-12-22 DIAGNOSIS — Z6830 Body mass index (BMI) 30.0-30.9, adult: Secondary | ICD-10-CM

## 2018-12-22 DIAGNOSIS — E669 Obesity, unspecified: Secondary | ICD-10-CM | POA: Diagnosis not present

## 2018-12-22 NOTE — Progress Notes (Signed)
Office: [phone]  /  Fax: [phone]   HPI:   Chief Complaint: OBESITY [name] is here to discuss her progress with her obesity treatment plan. She is on the Category 3 plan and is following her eating plan approximately 85 % of the time. She states she is doing cardio and strength training for 30 minutes 2 times per week. Jamita eating all food on plan. She denies polyphagia. She has increased water intake. She is very committed to weight loss at this point. Her weight is 195 lb (88.5 kg) today and has had a weight loss of 4 pounds over a period of 3 weeks since her last visit. She has lost 25 lbs since starting treatment with us.  Vitamin D deficiency [name] has a diagnosis of vitamin D deficiency. She is currently taking prescription Vit D and denies nausea, vomiting or muscle weakness.  ASSESSMENT AND PLAN:  Vitamin D deficiency  Class 1 obesity with serious comorbidity and body mass index (BMI) of 30.0 to 30.9 in adult, unspecified obesity type  PLAN:  Vitamin D Deficiency [name] was informed that low vitamin D levels contributes to fatigue and are associated with obesity, breast, and colon cancer. She agrees to continue to take prescription Vit D 50,000 IU every 3 days and will follow up for routine testing of vitamin D, at least 2-3 times per year. She was informed of the risk of over-replacement of vitamin D and agrees to not increase her dose unless she discusses this with us first. We will check labs at next visit. [name] agrees to follow up with our clinic in 3 weeks.  I spent > than 50% of the 15 minute visit on counseling as documented in the note.  Obesity [name] is currently in the action stage of change. As such, her goal is to continue with weight loss efforts She has agreed to follow the Category 3 plan [name] has been instructed to work continue with cardio and strength training for 30 minutes 2 times per week. We discussed the following Behavioral  Modification Strategies today: increase H20 intake and planning for success  [name] has agreed to follow up with our clinic in 3 weeks. She was informed of the importance of frequent follow up visits to maximize her success with intensive lifestyle modifications for her multiple health conditions.  ALLERGIES: No Known Allergies  MEDICATIONS: Current Outpatient Medications on File Prior to Visit  Medication Sig Dispense Refill  . escitalopram (LEXAPRO) 10 MG tablet TAKE 1/2 TABLET FOR 3 DAYS THEN INCREASE TO ONE TABLET BY MOUTH DAILY 90 tablet 3  . fluticasone (FLONASE) 50 MCG/ACT nasal spray Place 2 sprays into both nostrils daily. 16 g 6  . labetalol (NORMODYNE) 100 MG tablet Take 1 tablet (100 mg total) by mouth 2 (two) times daily. 180 tablet 1  . levonorgestrel (MIRENA, 52 MG,) 20 MCG/24HR IUD 1 Device by Intrauterine route once. [id number]    . metFORMIN (GLUCOPHAGE) 500 MG tablet Take 1 tablet (500 mg total) by mouth 2 (two) times daily with a meal. 60 tablet 0  . topiramate (TOPAMAX) 50 MG tablet Take 1 tablet (50 mg total) by mouth daily. 30 tablet 0  . Vitamin D, Ergocalciferol, (DRISDOL) 1.25 MG (50000 UT) CAPS capsule Take 1 capsule (50,000 Units total) by mouth every 3 (three) days. 10 capsule 0   No current facility-administered medications on file prior to visit.     PAST MEDICAL HISTORY: Past Medical History:  Diagnosis Date  . Anxiety   .  Asthma    pt states it is induced by excercise  . Chest pain 02/07/2017  . Dyspnea   . Essential hypertension 02/07/2017  . Headache   . Hx MRSA infection   . Infection    UTI  . Palpitations 02/07/2017  . Preeclampsia   . PVC (premature ventricular contraction) 03/18/2017    PAST SURGICAL HISTORY: Past Surgical History:  Procedure Laterality Date  . NO PAST SURGERIES      SOCIAL HISTORY: Social History   Tobacco Use  . Smoking status: Never Smoker  . Smokeless tobacco: Never Used  Substance Use Topics  . Alcohol use: Yes     Comment: social  . Drug use: No    FAMILY HISTORY: Family History  Problem Relation Age of Onset  . Hypertension Mother   . Cardiomyopathy Mother   . Heart disease Mother   . Anxiety disorder Mother   . Obesity Mother   . Hypertension Father   . Heart disease Father   . Hyperlipidemia Father   . Miscarriages / [place] Son   . Hypertension Brother   . Diabetes Maternal Grandmother   . Heart disease Maternal Grandfather   . Hypertension Maternal Grandfather   . Stroke Maternal Grandfather   . Heart disease Paternal Grandmother   . Hypertension Paternal Grandmother   . Stroke Paternal Grandmother   . Alzheimer's disease Paternal Grandmother   . Hearing loss Neg Hx     ROS: Review of Systems  Constitutional: Positive for weight loss.  Gastrointestinal: Negative for nausea and vomiting.  Musculoskeletal:       Negative for muscle weakness    PHYSICAL EXAM: Blood pressure 123/85, pulse 100, temperature 98.7 F (37.1 C), temperature source Oral, height 5\' 7"  (1.702 m), weight 195 lb (88.5 kg), SpO2 98 %. Body mass index is 30.54 kg/m. Physical Exam Vitals signs reviewed.  Constitutional:      Appearance: Normal appearance. She is obese.  Cardiovascular:     Rate and Rhythm: Normal rate.     Pulses: Normal pulses.  Pulmonary:     Effort: Pulmonary effort is normal.  Musculoskeletal: Normal range of motion.  Skin:    General: Skin is warm and dry.  Neurological:     Mental Status: She is alert and oriented to person, place, and time.  Psychiatric:        Mood and Affect: Mood normal.        Behavior: Behavior normal.     RECENT LABS AND TESTS: BMET    Component Value Date/Time   NA 137 08/11/2018 0811   NA 137 12/07/2017 1125   K 4.1 08/11/2018 0811   CL 105 08/11/2018 0811   CO2 24 08/11/2018 0811   GLUCOSE 95 08/11/2018 0811   BUN 10 08/11/2018 0811   BUN 10 12/07/2017 1125   CREATININE 0.81 08/11/2018 0811   CALCIUM 8.8 (L) 08/11/2018 0811    GFRNONAA >60 08/11/2018 0811   GFRAA >60 08/11/2018 0811   Lab Results  Component Value Date   HGBA1C 5.2 09/12/2018   HGBA1C 5.4 12/07/2017   Lab Results  Component Value Date   INSULIN 36.9 (H) 09/12/2018   INSULIN 13.7 12/07/2017   CBC    Component Value Date/Time   WBC 9.5 08/11/2018 0811   RBC 4.16 08/11/2018 0811   HGB 12.4 08/11/2018 0811   HGB 12.9 12/07/2017 1125   HCT 38.5 08/11/2018 0811   HCT 37.7 12/07/2017 1125   PLT 247 08/11/2018 0811  PLT 251 01/13/2017 1604   MCV 92.5 08/11/2018 0811   MCV 88 12/07/2017 1125   MCH 29.8 08/11/2018 0811   MCHC 32.2 08/11/2018 0811   RDW 13.5 08/11/2018 0811   RDW 14.3 12/07/2017 1125   LYMPHSABS 3.0 12/07/2017 1125   EOSABS 0.2 12/07/2017 1125   BASOSABS 0.0 12/07/2017 1125   Iron/TIBC/Ferritin/ %Sat No results found for: IRON, TIBC, FERRITIN, IRONPCTSAT Lipid Panel     Component Value Date/Time   CHOL 165 09/12/2018 0808   TRIG 66 09/12/2018 0808   HDL 43 09/12/2018 0808   CHOLHDL 3.4 01/13/2017 1604   LDLCALC 109 (H) 09/12/2018 0808   Hepatic Function Panel     Component Value Date/Time   PROT 7.8 12/07/2017 1125   ALBUMIN 4.2 12/07/2017 1125   AST 15 12/07/2017 1125   ALT 15 12/07/2017 1125   ALKPHOS 61 12/07/2017 1125   BILITOT 0.5 12/07/2017 1125      Component Value Date/Time   TSH 0.804 12/07/2017 1125   TSH 1.090 01/13/2017 1604      Ref. Range 09/12/2018 08:08  Vitamin D, 25-Hydroxy Latest Ref Range: 30.0 - 100.0 ng/mL 18.3 (L)     OBESITY BEHAVIORAL INTERVENTION VISIT  Today's visit was # 17   Starting weight: 220 lbs Starting date: 12/07/2017 Today's weight :: 195 lbs Today's date: 12/22/2018 Total lbs lost to date: 25   ASK: We discussed the diagnosis of obesity with Cristino Martes today and Jon Gills agreed to give [place] permission to discuss obesity behavioral modification therapy today.  ASSESS: Ezra has the diagnosis of obesity and her BMI today is 30.53 Avrey is in the  action stage of change   ADVISE: Shakila was educated on the multiple health risks of obesity as well as the benefit of weight loss to improve her health. She was advised of the need for long term treatment and the importance of lifestyle modifications to improve her current health and to decrease her risk of future health problems.  AGREE: Multiple dietary modification options and treatment options were discussed and  Aelyn agreed to follow the recommendations documented in the above note.  ARRANGE: Mariapaula was educated on the importance of frequent visits to treat obesity as outlined per CMS and USPSTF guidelines and agreed to schedule her next follow up appointment today.  I, Tammy Wysor, am acting as Energy manager for [place], FNP-C.  I have reviewed the above documentation for accuracy and completeness, and I agree with the above.  - Gurfateh Mcclain, FNP-C.

## 2018-12-26 ENCOUNTER — Other Ambulatory Visit (INDEPENDENT_AMBULATORY_CARE_PROVIDER_SITE_OTHER): Payer: Self-pay | Admitting: Physician Assistant

## 2018-12-26 DIAGNOSIS — F3289 Other specified depressive episodes: Secondary | ICD-10-CM

## 2018-12-26 DIAGNOSIS — E8881 Metabolic syndrome: Secondary | ICD-10-CM

## 2019-01-08 ENCOUNTER — Other Ambulatory Visit (INDEPENDENT_AMBULATORY_CARE_PROVIDER_SITE_OTHER): Payer: Self-pay | Admitting: Physician Assistant

## 2019-01-08 DIAGNOSIS — F3289 Other specified depressive episodes: Secondary | ICD-10-CM

## 2019-01-16 ENCOUNTER — Ambulatory Visit (INDEPENDENT_AMBULATORY_CARE_PROVIDER_SITE_OTHER): Payer: BC Managed Care – PPO | Admitting: Family Medicine

## 2019-01-16 ENCOUNTER — Encounter (INDEPENDENT_AMBULATORY_CARE_PROVIDER_SITE_OTHER): Payer: Self-pay | Admitting: Family Medicine

## 2019-01-16 VITALS — BP 131/88 | HR 89 | Temp 98.4°F | Ht 67.0 in | Wt 196.0 lb

## 2019-01-16 DIAGNOSIS — E669 Obesity, unspecified: Secondary | ICD-10-CM

## 2019-01-16 DIAGNOSIS — F3289 Other specified depressive episodes: Secondary | ICD-10-CM | POA: Diagnosis not present

## 2019-01-16 DIAGNOSIS — E559 Vitamin D deficiency, unspecified: Secondary | ICD-10-CM

## 2019-01-16 DIAGNOSIS — Z9189 Other specified personal risk factors, not elsewhere classified: Secondary | ICD-10-CM

## 2019-01-16 DIAGNOSIS — Z6830 Body mass index (BMI) 30.0-30.9, adult: Secondary | ICD-10-CM

## 2019-01-16 MED ORDER — VITAMIN D (ERGOCALCIFEROL) 1.25 MG (50000 UNIT) PO CAPS: 50000.0000 [IU] | ORAL_CAPSULE | ORAL | 0 refills | Status: DC

## 2019-01-16 NOTE — Progress Notes (Signed)
Office: [phone]  /  Fax: [phone]   HPI:   Chief Complaint: OBESITY Raven Walters is here to discuss her progress with her obesity treatment plan. She is on the Category 3 plan and is following her eating plan approximately 80% of the time. She states she is doing strength training and cardio 30 minutes 2 times per week. Raven Walters reports she has had recent food poisoning and was off the plan. She states she is on track today. Her weight is 196 lb (88.9 kg) today and has had a weight gain of 1 lb since her last visit. She has lost 24 lbs since starting treatment with [place].  Depression with emotional eating behaviors Raven Walters is struggling with emotional eating and using food for comfort to the extent that it is negatively impacting her health.  Raven Walters states she thinks Topamax is helping to decrease her cravings.  She often snacks when she is not hungry. Raven Walters sometimes feels she is out of control and then feels guilty that she made poor food choices. She has been working on behavior modification techniques to help reduce her emotional eating and has been somewhat successful. She shows no sign of suicidal or homicidal ideations.  Depression screen [facility] 2/9 06/22/2018 02/16/2018 12/07/2017 06/25/2017 06/18/2017  Decreased Interest 0 0 3 0 0  Down, Depressed, Hopeless 0 0 1 0 0  PHQ - 2 Score 0 0 4 0 0  Altered sleeping - - 3 - -  Tired, decreased energy - - 3 - -  Change in appetite - - 3 - -  Feeling bad or failure about yourself  - - 2 - -  Trouble concentrating - - 3 - -  Moving slowly or fidgety/restless - - 1 - -  Suicidal thoughts - - 0 - -  PHQ-9 Score - - 19 - -  Difficult doing work/chores - - Not difficult at all - -   Vitamin D deficiency Raven Walters has a diagnosis of Vitamin D deficiency which is not at goal. Her last Vitamin D level was reported to be 18.3 on 09/12/2018. She is currently taking prescription Vit D and denies nausea, vomiting or muscle weakness.  At risk for osteopenia  and osteoporosis Raven Walters is at higher risk of osteopenia and osteoporosis due to Vitamin D deficiency.   ASSESSMENT AND PLAN:  Vitamin D deficiency - Plan: Vitamin D, Ergocalciferol, (DRISDOL) 1.25 MG (50000 UT) CAPS capsule  Other depression - with emotional eating  At risk for osteoporosis  Class 1 obesity with serious comorbidity and body mass index (BMI) of 30.0 to 30.9 in adult, unspecified obesity type  PLAN:  Depression with Emotional Eating Behaviors We discussed behavior modification techniques today to help Raven Walters deal with her emotional eating and depression. She has agreed to continue taking Topamax and agreed to follow-up as directed.  Vitamin D Deficiency Raven Walters was informed that low Vitamin D levels contributes to fatigue and are associated with obesity, breast, and colon cancer. She agrees to continue to take prescription Vit D @ 50,000 IU every week #4 with 0 refills and will follow-up for routine testing of Vitamin D, at her next visit. She was informed of the risk of over-replacement of Vitamin D and agrees to not increase her dose unless she discusses this with [place] first. Raven Walters agrees to follow-up with our clinic in 2-3 weeks.  At risk for osteopenia and osteoporosis Raven Walters was given extended  (15 minutes) osteoporosis prevention counseling today. Raven Walters is at risk for osteopenia and  osteoporsis due to her Vitamin D deficiency. She was encouraged to take her Vitamin D and follow her higher calcium diet and increase strengthening exercise to help strengthen her bones and decrease her risk of osteopenia and osteoporosis.  Obesity Raven Walters is currently in the action stage of change. As such, her goal is to continue with weight loss efforts. She has agreed to follow the Category 3 plan. Raven Walters has been instructed to continue her exercise regimen as noted above. We discussed the following Behavioral Modification Strategies today: increasing lean protein intake, emotional  eating strategies, travel eating strategies, celebration eating strategies, and planning for success.  Raven Walters has agreed to follow-up with our clinic in 2-3 weeks. She was informed of the importance of frequent follow up visits to maximize her success with intensive lifestyle modifications for her multiple health conditions.  ALLERGIES: No Known Allergies  MEDICATIONS: Current Outpatient Medications on File Prior to Visit  Medication Sig Dispense Refill  . escitalopram (LEXAPRO) 10 MG tablet TAKE 1/2 TABLET FOR 3 DAYS THEN INCREASE TO ONE TABLET BY MOUTH DAILY 90 tablet 3  . fluticasone (FLONASE) 50 MCG/ACT nasal spray Place 2 sprays into both nostrils daily. 16 g 6  . labetalol (NORMODYNE) 100 MG tablet Take 1 tablet (100 mg total) by mouth 2 (two) times daily. 180 tablet 1  . levonorgestrel (MIRENA, 52 MG,) 20 MCG/24HR IUD 1 Device by Intrauterine route once. [id number]    . metFORMIN (GLUCOPHAGE) 500 MG tablet Take 1 tablet (500 mg total) by mouth 2 (two) times daily with a meal. 60 tablet 0  . topiramate (TOPAMAX) 50 MG tablet TAKE 1 TABLET BY MOUTH EVERY DAY 30 tablet 0   No current facility-administered medications on file prior to visit.     PAST MEDICAL HISTORY: Past Medical History:  Diagnosis Date  . Anxiety   . Asthma    pt states it is induced by excercise  . Chest pain 02/07/2017  . Dyspnea   . Essential hypertension 02/07/2017  . Headache   . Hx MRSA infection   . Infection    UTI  . Palpitations 02/07/2017  . Preeclampsia   . PVC (premature ventricular contraction) 03/18/2017    PAST SURGICAL HISTORY: Past Surgical History:  Procedure Laterality Date  . NO PAST SURGERIES      SOCIAL HISTORY: Social History   Tobacco Use  . Smoking status: Never Smoker  . Smokeless tobacco: Never Used  Substance Use Topics  . Alcohol use: Yes    Comment: social  . Drug use: No    FAMILY HISTORY: Family History  Problem Relation Age of Onset  . Hypertension Mother   .  Cardiomyopathy Mother   . Heart disease Mother   . Anxiety disorder Mother   . Obesity Mother   . Hypertension Father   . Heart disease Father   . Hyperlipidemia Father   . Miscarriages / [place] Son   . Hypertension Brother   . Diabetes Maternal Grandmother   . Heart disease Maternal Grandfather   . Hypertension Maternal Grandfather   . Stroke Maternal Grandfather   . Heart disease Paternal Grandmother   . Hypertension Paternal Grandmother   . Stroke Paternal Grandmother   . Alzheimer's disease Paternal Grandmother   . Hearing loss Neg Hx    ROS: Review of Systems  Constitutional: Negative for weight loss.  Gastrointestinal: Negative for nausea and vomiting.  Musculoskeletal:       Negative for muscle weakness.  Endo/Heme/Allergies:  Negative for hypoglycemia.   PHYSICAL EXAM: Blood pressure 131/88, pulse 89, temperature 98.4 F (36.9 C), height 5\' 7"  (1.702 m), weight 196 lb (88.9 kg), SpO2 100 %. Body mass index is 30.7 kg/m. Physical Exam Vitals signs reviewed.  Constitutional:      Appearance: Normal appearance. She is obese.  Cardiovascular:     Rate and Rhythm: Normal rate.     Pulses: Normal pulses.  Pulmonary:     Effort: Pulmonary effort is normal.     Breath sounds: Normal breath sounds.  Musculoskeletal: Normal range of motion.  Skin:    General: Skin is warm and dry.  Neurological:     Mental Status: She is alert and oriented to person, place, and time.  Psychiatric:        Behavior: Behavior normal.   RECENT LABS AND TESTS: BMET    Component Value Date/Time   NA 137 08/11/2018 0811   NA 137 12/07/2017 1125   K 4.1 08/11/2018 0811   CL 105 08/11/2018 0811   CO2 24 08/11/2018 0811   GLUCOSE 95 08/11/2018 0811   BUN 10 08/11/2018 0811   BUN 10 12/07/2017 1125   CREATININE 0.81 08/11/2018 0811   CALCIUM 8.8 (L) 08/11/2018 0811   GFRNONAA >60 08/11/2018 0811   GFRAA >60 08/11/2018 0811   Lab Results  Component Value Date   HGBA1C  5.2 09/12/2018   HGBA1C 5.4 12/07/2017   Lab Results  Component Value Date   INSULIN 36.9 (H) 09/12/2018   INSULIN 13.7 12/07/2017   CBC    Component Value Date/Time   WBC 9.5 08/11/2018 0811   RBC 4.16 08/11/2018 0811   HGB 12.4 08/11/2018 0811   HGB 12.9 12/07/2017 1125   HCT 38.5 08/11/2018 0811   HCT 37.7 12/07/2017 1125   PLT 247 08/11/2018 0811   PLT 251 01/13/2017 1604   MCV 92.5 08/11/2018 0811   MCV 88 12/07/2017 1125   MCH 29.8 08/11/2018 0811   MCHC 32.2 08/11/2018 0811   RDW 13.5 08/11/2018 0811   RDW 14.3 12/07/2017 1125   LYMPHSABS 3.0 12/07/2017 1125   EOSABS 0.2 12/07/2017 1125   BASOSABS 0.0 12/07/2017 1125   Iron/TIBC/Ferritin/ %Sat No results found for: IRON, TIBC, FERRITIN, IRONPCTSAT Lipid Panel     Component Value Date/Time   CHOL 165 09/12/2018 0808   TRIG 66 09/12/2018 0808   HDL 43 09/12/2018 0808   CHOLHDL 3.4 01/13/2017 1604   LDLCALC 109 (H) 09/12/2018 0808   Hepatic Function Panel     Component Value Date/Time   PROT 7.8 12/07/2017 1125   ALBUMIN 4.2 12/07/2017 1125   AST 15 12/07/2017 1125   ALT 15 12/07/2017 1125   ALKPHOS 61 12/07/2017 1125   BILITOT 0.5 12/07/2017 1125      Component Value Date/Time   TSH 0.804 12/07/2017 1125   TSH 1.090 01/13/2017 1604    Ref. Range 09/12/2018 08:08  Vitamin D, 25-Hydroxy Latest Ref Range: 30.0 - 100.0 ng/mL 18.3 (L)   OBESITY BEHAVIORAL INTERVENTION VISIT  Today's visit was #18   Starting weight: 220 lbs Starting date: 12/07/2017 Today's weight: 196 lbs Today's date: 01/16/2019 Total lbs lost to date: 24    01/16/2019  Height 5\' 7"  (1.702 m)  Weight 196 lb (88.9 kg)  BMI (Calculated) 30.69  BLOOD PRESSURE - SYSTOLIC 131  BLOOD PRESSURE - DIASTOLIC 88   Body Fat % 38.9 %  Total Body Water (lbs) 79 lbs   ASK: We discussed the diagnosis  of obesity with Raven Walters today and Raven Walters agreed to give [place] permission to discuss obesity behavioral modification therapy  today.  ASSESS: Raven Walters has the diagnosis of obesity and her BMI today is 30.69. Raven Walters is in the action stage of change.   ADVISE: Kinnidi was educated on the multiple health risks of obesity as well as the benefit of weight loss to improve her health. She was advised of the need for long term treatment and the importance of lifestyle modifications to improve her current health and to decrease her risk of future health problems.  AGREE: Multiple dietary modification options and treatment options were discussed and  Sary agreed to follow the recommendations documented in the above note.  ARRANGE: Raven Walters was educated on the importance of frequent visits to treat obesity as outlined per CMS and USPSTF guidelines and agreed to schedule her next follow up appointment today.  IMarianna Payment, am acting as Energy manager for [place], FNP-C.  I have reviewed the above documentation for accuracy and completeness, and I agree with the above.  - Jader Desai, FNP-C.

## 2019-01-17 ENCOUNTER — Encounter (INDEPENDENT_AMBULATORY_CARE_PROVIDER_SITE_OTHER): Payer: Self-pay | Admitting: Family Medicine

## 2019-01-17 DIAGNOSIS — F329 Major depressive disorder, single episode, unspecified: Secondary | ICD-10-CM | POA: Insufficient documentation

## 2019-01-17 DIAGNOSIS — F32A Depression, unspecified: Secondary | ICD-10-CM | POA: Insufficient documentation

## 2019-01-23 ENCOUNTER — Other Ambulatory Visit (INDEPENDENT_AMBULATORY_CARE_PROVIDER_SITE_OTHER): Payer: Self-pay | Admitting: Family Medicine

## 2019-01-23 ENCOUNTER — Other Ambulatory Visit (INDEPENDENT_AMBULATORY_CARE_PROVIDER_SITE_OTHER): Payer: Self-pay | Admitting: Physician Assistant

## 2019-01-23 DIAGNOSIS — F3289 Other specified depressive episodes: Secondary | ICD-10-CM

## 2019-01-23 DIAGNOSIS — E8881 Metabolic syndrome: Secondary | ICD-10-CM

## 2019-01-31 ENCOUNTER — Encounter (INDEPENDENT_AMBULATORY_CARE_PROVIDER_SITE_OTHER): Payer: Self-pay

## 2019-02-01 ENCOUNTER — Ambulatory Visit: Payer: Self-pay | Admitting: *Deleted

## 2019-02-01 NOTE — Telephone Encounter (Signed)
Patient works in [employer] in [place] where confirmed cases were announced. Patient states she is not feeling well and wants to speak to some one about what she should be doing at this time.  Triaged patient- patient has started to have symptoms in her chest- but she feels that they are not bad enough at this time to be seen at ED for. Patient is going to stay at home and monitor her symptoms- she was advised to call [email or web address] for E-visit- will send note to office for PCP review- patient to message PCP on MyChart to keep her informed of progress.  Reason for Disposition . [1] Cough occurs AND [2] within 14 days of COVID-19 EXPOSURE  Answer Assessment - Initial Assessment Questions 1. 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 PLACE of CONTACT: "Where were you when you were exposed to COVID-19  (coronavirus disease 2019)?" (e.g., city, state, country)     [place], [place] 3. TYPE of CONTACT: "How much contact was there?" (e.g., live in same house, work in same office, same school)     Work in same office 4. DATE of CONTACT: "When did you have contact with a coronavirus patient?" (e.g., days)     between 3/1-3/10 5. DURATION of CONTACT: "How long were you in contact with the COVID-19 (coronavirus disease) patient?" (e.g., a few seconds, passed by person, a few minutes, live with the patient)     Passed by or company meeting  6. SYMPTOMS: "Do you have any symptoms?" (e.g., fever, cough, breathing difficulty)     Cough, fatigue, warm, taking deep breaths- slight chest tightness, throat sore 7. PREGNANCY OR POSTPARTUM: "Is there any chance you are pregnant?" "When was your last menstrual period?" "Did you deliver in the last 2 weeks?"     No- LMP- 01/26/19 8. HIGH RISK: "Do you have any heart or lung problems? Do you have a weakened immune system?" (e.g., CHF, COPD, asthma, HIV  positive, chemotherapy, renal failure, diabetes mellitus, sickle cell anemia)     Asthma, high blood presure  Protocols used: CORONAVIRUS (COVID-19) EXPOSURE-A-AH

## 2019-02-02 NOTE — Telephone Encounter (Signed)
Noted  

## 2019-02-06 ENCOUNTER — Ambulatory Visit (INDEPENDENT_AMBULATORY_CARE_PROVIDER_SITE_OTHER): Payer: BC Managed Care – PPO | Admitting: Family Medicine

## 2019-02-06 ENCOUNTER — Encounter (INDEPENDENT_AMBULATORY_CARE_PROVIDER_SITE_OTHER): Payer: Self-pay

## 2019-02-06 ENCOUNTER — Encounter (INDEPENDENT_AMBULATORY_CARE_PROVIDER_SITE_OTHER): Payer: Self-pay | Admitting: Family Medicine

## 2019-02-06 ENCOUNTER — Other Ambulatory Visit: Payer: Self-pay

## 2019-02-06 DIAGNOSIS — E559 Vitamin D deficiency, unspecified: Secondary | ICD-10-CM | POA: Diagnosis not present

## 2019-02-06 DIAGNOSIS — I10 Essential (primary) hypertension: Secondary | ICD-10-CM | POA: Diagnosis not present

## 2019-02-06 DIAGNOSIS — Z6830 Body mass index (BMI) 30.0-30.9, adult: Secondary | ICD-10-CM

## 2019-02-06 DIAGNOSIS — E8881 Metabolic syndrome: Secondary | ICD-10-CM | POA: Diagnosis not present

## 2019-02-06 DIAGNOSIS — F3289 Other specified depressive episodes: Secondary | ICD-10-CM | POA: Diagnosis not present

## 2019-02-06 DIAGNOSIS — E669 Obesity, unspecified: Secondary | ICD-10-CM

## 2019-02-06 MED ORDER — LABETALOL HCL 100 MG PO TABS: 100.0000 mg | ORAL_TABLET | Freq: Two times a day (BID) | ORAL | 0 refills | Status: DC

## 2019-02-06 MED ORDER — TOPIRAMATE 50 MG PO TABS: 50.0000 mg | ORAL_TABLET | Freq: Every day | ORAL | 0 refills | Status: AC

## 2019-02-06 MED ORDER — VITAMIN D (ERGOCALCIFEROL) 1.25 MG (50000 UNIT) PO CAPS: 50000.0000 [IU] | ORAL_CAPSULE | ORAL | 0 refills | Status: DC

## 2019-02-07 NOTE — Progress Notes (Addendum)
Office: [phone]  /  Fax: [phone] TeleHealth Visit:  Raven Walters has consented to this TeleHealth visit today via face time. The patient is located at home, the provider is located at the [facility] office. The participants in this visit include the listed provider and patient.   HPI:   Chief Complaint: OBESITY Raven Walters is here to discuss her progress with her obesity treatment plan. She is on the Category 3 plan and is following her eating plan approximately 50 % of the time. She states she is walking for 30 minutes 4 times per week. Raven Walters is struggling with finding meat, eggs, and produce. She has not been able to weigh. She is doing the best she can, but is not getting all protein in.  We were unable to weight the patient today for this TeleHealth visit. She is unsure if she has gained or lost weight since her last visit. She has lost 3 lbs since starting treatment with [place].  Hypertension [name] is a 33 y.o. female with hypertension. [name] does not have a blood pressure cuff at home to monitor her blood pressure. Her blood pressure has been well controlled recently. She denies headaches, chest pain, or shortness of breath. She is working on weight loss to help control her blood pressure with the goal of decreasing her risk of heart attack and stroke.  BP Readings from Last 3 Encounters:  01/16/19 131/88  12/22/18 123/85  12/07/18 136/87     Insulin Resistance [name] has a diagnosis of insulin resistance based on her elevated fasting insulin level >5. Although Raven Walters's blood glucose readings are still under good control, insulin resistance puts her at greater risk of metabolic syndrome and diabetes. She is taking metformin currently and continues to work on diet and exercise to decrease risk of diabetes. She denies polyphagia. Lab Results  Component Value Date   HGBA1C 5.2 09/12/2018    Vitamin D Deficiency Raven Walters has a diagnosis of vitamin D  deficiency. She is currently taking prescription Vit D, but level not at goal. Last Vit D level was 18.3 on 09/12/18. She denies nausea, vomiting or muscle weakness.  Depression with Emotional Eating Behaviors Raven Walters feels topiramate continues to help with stress eating. She notes stress eating is well controlled.  She has been working on behavior modification techniques to help reduce her emotional eating and has been somewhat successful. She shows no sign of suicidal or homicidal ideations.  Depression screen [facility] 2/9 06/22/2018 02/16/2018 12/07/2017 06/25/2017 06/18/2017  Decreased Interest 0 0 3 0 0  Down, Depressed, Hopeless 0 0 1 0 0  PHQ - 2 Score 0 0 4 0 0  Altered sleeping - - 3 - -  Tired, decreased energy - - 3 - -  Change in appetite - - 3 - -  Feeling bad or failure about yourself  - - 2 - -  Trouble concentrating - - 3 - -  Moving slowly or fidgety/restless - - 1 - -  Suicidal thoughts - - 0 - -  PHQ-9 Score - - 19 - -  Difficult doing work/chores - - Not difficult at all - -    ASSESSMENT AND PLAN:  Essential hypertension - Plan: T3, T4, free, TSH, labetalol (NORMODYNE) 100 MG tablet  Insulin resistance - Plan: Comprehensive metabolic panel, Hemoglobin A1c, Insulin, random, T3, T4, free, TSH  Vitamin D deficiency - Plan: VITAMIN D 25 Hydroxy (Vit-D Deficiency, Fractures), T3, T4, free, TSH, Vitamin D, Ergocalciferol, (  DRISDOL) 1.25 MG (50000 UT) CAPS capsule  Other depression - with emotional eating - Plan: topiramate (TOPAMAX) 50 MG tablet  Class 1 obesity with serious comorbidity and body mass index (BMI) of 30.0 to 30.9 in adult, unspecified obesity type  PLAN:  Hypertension We discussed sodium restriction, working on healthy weight loss, and a regular exercise program as the means to achieve improved blood pressure control. Raven Walters agreed with this plan and agreed to follow up as directed. We will continue to monitor her blood pressure as well as her progress with the  above lifestyle modifications. Raven Walters agrees to continue taking labetalol 100 mg BID #60 and we will refill for 1 month. She will watch for signs of hypotension as she continues her lifestyle modifications. Raven Walters agrees to follow up with our clinic in 2 to 3 weeks.  Insulin Resistance Raven Walters will continue to work on weight loss, exercise, and decreasing simple carbohydrates in her diet to help decrease the risk of diabetes.  Raven Walters agrees to continue taking metformin, and we will check A1c, fasting insulin and glucose. Raven Walters agrees to follow up with our clinic in 2 to 3 weeks as directed to monitor her progress.  Vitamin D Deficiency Raven Walters was informed that low vitamin D levels contributes to fatigue and are associated with obesity, breast, and colon cancer. Raven Walters agrees to continue taking prescription Vit D ,000 IU every 3 days #10 and we will refill for 1 month. She will follow up for routine testing of vitamin D, at least 2-3 times per year. She was informed of the risk of over-replacement of vitamin D and agrees to not increase her dose unless she discusses this with [place] first. Raven Walters agrees to follow up with our clinic in 2 to 3 weeks.  Depression with Emotional Eating Behaviors We discussed behavior modification techniques today to help Raven Walters deal with her emotional eating and depression. Raven Walters agrees to continue taking topiramate 50 mg qd #30 and we will refill for 1 month. Raven Walters agrees to follow up with our clinic in 2 to 3 weeks. She has an IUD.  Obesity [name] is currently in the action stage of change. As such, her goal is to continue with weight loss efforts She has agreed to follow the Category 3 plan [name] has been instructed to work up to a goal of 150 minutes of combined cardio and strengthening exercise per week as is for weight loss and overall health benefits. We discussed the following Behavioral Modification Strategies today: increasing lean protein intake, keeping  healthy foods in the home, and planning for success   Raven Walters has agreed to follow up with our clinic in 2 to 3 weeks. She was informed of the importance of frequent follow up visits to maximize her success with intensive lifestyle modifications for her multiple health conditions.  ALLERGIES: No Known Allergies  MEDICATIONS: Current Outpatient Medications on File Prior to Visit  Medication Sig Dispense Refill  . escitalopram (LEXAPRO) 10 MG tablet TAKE 1/2 TABLET FOR 3 DAYS THEN INCREASE TO ONE TABLET BY MOUTH DAILY 90 tablet 3  . fluticasone (FLONASE) 50 MCG/ACT nasal spray Place 2 sprays into both nostrils daily. 16 g 6  . levonorgestrel (MIRENA, 52 MG,) 20 MCG/24HR IUD 1 Device by Intrauterine route once. [id number]    . metFORMIN (GLUCOPHAGE) 500 MG tablet Take 1 tablet (500 mg total) by mouth 2 (two) times daily with a meal. 60 tablet 0   No current facility-administered medications on file prior to visit.  PAST MEDICAL HISTORY: Past Medical History:  Diagnosis Date  . Anxiety   . Asthma    pt states it is induced by excercise  . Chest pain 02/07/2017  . Dyspnea   . Essential hypertension 02/07/2017  . Headache   . Hx MRSA infection   . Infection    UTI  . Palpitations 02/07/2017  . Preeclampsia   . PVC (premature ventricular contraction) 03/18/2017    PAST SURGICAL HISTORY: Past Surgical History:  Procedure Laterality Date  . NO PAST SURGERIES      SOCIAL HISTORY: Social History   Tobacco Use  . Smoking status: Never Smoker  . Smokeless tobacco: Never Used  Substance Use Topics  . Alcohol use: Yes    Comment: social  . Drug use: No    FAMILY HISTORY: Family History  Problem Relation Age of Onset  . Hypertension Mother   . Cardiomyopathy Mother   . Heart disease Mother   . Anxiety disorder Mother   . Obesity Mother   . Hypertension Father   . Heart disease Father   . Hyperlipidemia Father   . Miscarriages / [place] Son   . Hypertension Brother   .  Diabetes Maternal Grandmother   . Heart disease Maternal Grandfather   . Hypertension Maternal Grandfather   . Stroke Maternal Grandfather   . Heart disease Paternal Grandmother   . Hypertension Paternal Grandmother   . Stroke Paternal Grandmother   . Alzheimer's disease Paternal Grandmother   . Hearing loss Neg Hx     ROS: Review of Systems  Constitutional: Negative for weight loss.  Respiratory: Negative for sputum production.   Cardiovascular: Negative for chest pain.  Gastrointestinal: Negative for nausea and vomiting.  Musculoskeletal:       Negative muscle weakness  Neurological: Negative for headaches.  Endo/Heme/Allergies:       Negative polyphagia  Psychiatric/Behavioral: Positive for depression. Negative for suicidal ideas.    PHYSICAL EXAM: Pt in no acute distress  RECENT LABS AND TESTS: BMET    Component Value Date/Time   NA 137 08/11/2018 0811   NA 137 12/07/2017 1125   K 4.1 08/11/2018 0811   CL 105 08/11/2018 0811   CO2 24 08/11/2018 0811   GLUCOSE 95 08/11/2018 0811   BUN 10 08/11/2018 0811   BUN 10 12/07/2017 1125   CREATININE 0.81 08/11/2018 0811   CALCIUM 8.8 (L) 08/11/2018 0811   GFRNONAA >60 08/11/2018 0811   GFRAA >60 08/11/2018 0811   Lab Results  Component Value Date   HGBA1C 5.2 09/12/2018   HGBA1C 5.4 12/07/2017   Lab Results  Component Value Date   INSULIN 36.9 (H) 09/12/2018   INSULIN 13.7 12/07/2017   CBC    Component Value Date/Time   WBC 9.5 08/11/2018 0811   RBC 4.16 08/11/2018 0811   HGB 12.4 08/11/2018 0811   HGB 12.9 12/07/2017 1125   HCT 38.5 08/11/2018 0811   HCT 37.7 12/07/2017 1125   PLT 247 08/11/2018 0811   PLT 251 01/13/2017 1604   MCV 92.5 08/11/2018 0811   MCV 88 12/07/2017 1125   MCH 29.8 08/11/2018 0811   MCHC 32.2 08/11/2018 0811   RDW 13.5 08/11/2018 0811   RDW 14.3 12/07/2017 1125   LYMPHSABS 3.0 12/07/2017 1125   EOSABS 0.2 12/07/2017 1125   BASOSABS 0.0 12/07/2017 1125   Iron/TIBC/Ferritin/  %Sat No results found for: IRON, TIBC, FERRITIN, IRONPCTSAT Lipid Panel     Component Value Date/Time   CHOL 165 09/12/2018 0808  TRIG 66 09/12/2018 0808   HDL 43 09/12/2018 0808   CHOLHDL 3.4 01/13/2017 1604   LDLCALC 109 (H) 09/12/2018 0808   Hepatic Function Panel     Component Value Date/Time   PROT 7.8 12/07/2017 1125   ALBUMIN 4.2 12/07/2017 1125   AST 15 12/07/2017 1125   ALT 15 12/07/2017 1125   ALKPHOS 61 12/07/2017 1125   BILITOT 0.5 12/07/2017 1125      Component Value Date/Time   TSH 0.804 12/07/2017 1125   TSH 1.090 01/13/2017 1604      I, Burt Knack, am acting as Energy manager for [place], FNP-C.  I have reviewed the above documentation for accuracy and completeness, and I agree with the above.  - Alika Eppes, FNP-C.

## 2019-02-08 ENCOUNTER — Encounter (INDEPENDENT_AMBULATORY_CARE_PROVIDER_SITE_OTHER): Payer: Self-pay | Admitting: Family Medicine

## 2019-02-20 ENCOUNTER — Other Ambulatory Visit (INDEPENDENT_AMBULATORY_CARE_PROVIDER_SITE_OTHER): Payer: Self-pay | Admitting: Family Medicine

## 2019-02-20 DIAGNOSIS — I10 Essential (primary) hypertension: Secondary | ICD-10-CM

## 2019-02-20 DIAGNOSIS — F3289 Other specified depressive episodes: Secondary | ICD-10-CM

## 2019-02-21 ENCOUNTER — Ambulatory Visit (INDEPENDENT_AMBULATORY_CARE_PROVIDER_SITE_OTHER): Payer: BLUE CROSS/BLUE SHIELD | Admitting: Family Medicine

## 2019-02-21 ENCOUNTER — Other Ambulatory Visit (INDEPENDENT_AMBULATORY_CARE_PROVIDER_SITE_OTHER): Payer: Self-pay | Admitting: Family Medicine

## 2019-02-21 ENCOUNTER — Encounter (INDEPENDENT_AMBULATORY_CARE_PROVIDER_SITE_OTHER): Payer: Self-pay | Admitting: Family Medicine

## 2019-02-21 ENCOUNTER — Other Ambulatory Visit: Payer: Self-pay

## 2019-02-21 DIAGNOSIS — E559 Vitamin D deficiency, unspecified: Secondary | ICD-10-CM

## 2019-02-21 DIAGNOSIS — I10 Essential (primary) hypertension: Secondary | ICD-10-CM

## 2019-02-21 DIAGNOSIS — E8881 Metabolic syndrome: Secondary | ICD-10-CM

## 2019-02-21 DIAGNOSIS — F3289 Other specified depressive episodes: Secondary | ICD-10-CM | POA: Diagnosis not present

## 2019-02-21 DIAGNOSIS — E669 Obesity, unspecified: Secondary | ICD-10-CM

## 2019-02-21 DIAGNOSIS — Z6830 Body mass index (BMI) 30.0-30.9, adult: Secondary | ICD-10-CM

## 2019-02-21 NOTE — Progress Notes (Signed)
Office: [phone]  /  Fax: [phone] TeleHealth Visit:  Raven Walters has verbally consented to this TeleHealth visit today. The patient is located at home, the provider is located at the [facility] office. The participants in this visit include the listed provider and patient. The visit was conducted today via Webex.  HPI:   Chief Complaint: OBESITY Raven Walters is here to discuss her progress with her obesity treatment plan. She is on the Category 3 plan and is following her eating plan approximately 60% of the time. She states she is walking 60 minutes 2 times per week. Raven Walters states she weighed 191.7 lbs today. She believes she has lost 2 lbs. She reports doing well on her plan and is finding all the foods that she needs. We were unable to weigh the patient today for this TeleHealth visit. She feels as if she has lost 2 lbs since her last visit. She has lost 24 lbs since starting treatment with Raven Walters.  Insulin Resistance Raven Walters has a diagnosis of insulin resistance based on her elevated fasting insulin level >5. Although Raven Walters's blood glucose readings are still under good control, insulin resistance puts her at greater risk of metabolic syndrome and diabetes. She is taking metformin currently and continues to work on diet and exercise to decrease risk of diabetes. Denies polyphagia. Lab Results  Component Value Date   HGBA1C 5.2 09/12/2018    Depression with emotional eating behaviors Raven Walters is struggling with emotional eating and using food for comfort to the extent that it is negatively impacting her health. . She shows no sign of suicidal or homicidal ideations. Raven Walters reports Topamax is working very well to decrease her sweet cravings. She also states her emotional eating is well controlled.  [facility] 2[facility] 06/22/2018 02/16/2018 12/07/2017 06/25/2017 06/18/2017  Decreased Interest 0 0 3 0 0  Down, Depressed, Hopeless 0 0 1 0 0  PHQ - 2 Score 0 0 4 0 0   Altered sleeping - - 3 - -  Tired, decreased energy - - 3 - -  Change in appetite - - 3 - -  Feeling bad or failure about yourself  - - 2 - -  Trouble concentrating - - 3 - -  Moving slowly or fidgety/restless - - 1 - -  Suicidal thoughts - - 0 - -  PHQ-9 Score - - 19 - -  Difficult doing work/chores - - Not difficult at all - -   ASSESSMENT AND PLAN:  Insulin resistance  Other depression - with emotional eating  Class 1 obesity with serious comorbidity and body mass index (BMI) of 30.0 to 30.9 in adult, unspecified obesity type  PLAN:  Insulin Resistance Raven Walters will continue to work on weight loss, exercise, and decreasing simple carbohydrates in her diet to help decrease the risk of diabetes. We dicussed metformin including benefits and risks. She was informed that eating too many simple carbohydrates or too many calories at one sitting increases the likelihood of GI side effects. Raven Walters is currently taking metformin and prescription was not written today. Raven Walters agreed to follow-up with Raven Walters as directed to monitor her progress.  Depression with Emotional Eating Behaviors We discussed behavior modification techniques today to help Raven Walters deal with her emotional eating and depression. She will continue taking Topamax and agrees to follow-up with our clinic in 2 weeks.  I spent > than 50% of the 15 minute visit on counseling as documented in the note.  Obesity Raven Walters is  currently in the action stage of change. As such, her goal is to continue with weight loss efforts. She has agreed to follow the Category 3 plan. Raven Walters has been instructed to continue her current exercise regimen for weight loss and overall health benefits. We discussed the following Behavioral Modification Strategies today: planning for success.  Raven Walters has agreed to follow-up with our clinic in 2 weeks. She was informed of the importance of frequent follow-up visits to maximize her success with intensive lifestyle  modifications for her multiple health conditions.  ALLERGIES: No Known Allergies  MEDICATIONS: Current Outpatient Medications on File Prior to Visit  Medication Sig Dispense Refill  . escitalopram (LEXAPRO) 10 MG tablet TAKE 1/2 TABLET FOR 3 DAYS THEN INCREASE TO ONE TABLET BY MOUTH DAILY 90 tablet 3  . fluticasone (FLONASE) 50 MCG/ACT nasal spray Place 2 sprays into both nostrils daily. 16 g 6  . labetalol (NORMODYNE) 100 MG tablet Take 1 tablet (100 mg total) by mouth 2 (two) times daily. 60 tablet 0  . levonorgestrel (MIRENA, 52 MG,) 20 MCG/24HR IUD 1 Device by Intrauterine route once. [id number]    . metFORMIN (GLUCOPHAGE) 500 MG tablet Take 1 tablet (500 mg total) by mouth 2 (two) times daily with a meal. 60 tablet 0  . topiramate (TOPAMAX) 50 MG tablet Take 1 tablet (50 mg total) by mouth daily. 30 tablet 0  . Vitamin D, Ergocalciferol, (DRISDOL) 1.25 MG (50000 UT) CAPS capsule Take 1 capsule (50,000 Units total) by mouth every 3 (three) days. 10 capsule 0   No current facility-administered medications on file prior to visit.     PAST MEDICAL HISTORY: Past Medical History:  Diagnosis Date  . Anxiety   . Asthma    pt states it is induced by excercise  . Chest pain 02/07/2017  . Dyspnea   . Essential hypertension 02/07/2017  . Headache   . Hx MRSA infection   . Infection    UTI  . Palpitations 02/07/2017  . Preeclampsia   . PVC (premature ventricular contraction) 03/18/2017    PAST SURGICAL HISTORY: Past Surgical History:  Procedure Laterality Date  . NO PAST SURGERIES      SOCIAL HISTORY: Social History   Tobacco Use  . Smoking status: Never Smoker  . Smokeless tobacco: Never Used  Substance Use Topics  . Alcohol use: Yes    Comment: social  . Drug use: No    FAMILY HISTORY: Family History  Problem Relation Age of Onset  . Hypertension Mother   . Cardiomyopathy Mother   . Heart disease Mother   . Anxiety disorder Mother   . Obesity Mother   . Hypertension  Father   . Heart disease Father   . Hyperlipidemia Father   . Miscarriages / [place] Son   . Hypertension Brother   . Diabetes Maternal Grandmother   . Heart disease Maternal Grandfather   . Hypertension Maternal Grandfather   . Stroke Maternal Grandfather   . Heart disease Paternal Grandmother   . Hypertension Paternal Grandmother   . Stroke Paternal Grandmother   . Alzheimer's disease Paternal Grandmother   . Hearing loss Neg Hx    ROS: Review of Systems  Endo/Heme/Allergies:       Negative for polyphagia.  Psychiatric/Behavioral: Positive for depression (emotional eating).   PHYSICAL EXAM: Pt in no acute distress  RECENT LABS AND TESTS: BMET    Component Value Date/Time   NA 137 08/11/2018 0811   NA 137 12/07/2017 1125   K  4.1 08/11/2018 0811   CL 105 08/11/2018 0811   CO2 24 08/11/2018 0811   GLUCOSE 95 08/11/2018 0811   BUN 10 08/11/2018 0811   BUN 10 12/07/2017 1125   CREATININE 0.81 08/11/2018 0811   CALCIUM 8.8 (L) 08/11/2018 0811   GFRNONAA >60 08/11/2018 0811   GFRAA >60 08/11/2018 0811   Lab Results  Component Value Date   HGBA1C 5.2 09/12/2018   HGBA1C 5.4 12/07/2017   Lab Results  Component Value Date   INSULIN 36.9 (H) 09/12/2018   INSULIN 13.7 12/07/2017   CBC    Component Value Date/Time   WBC 9.5 08/11/2018 0811   RBC 4.16 08/11/2018 0811   HGB 12.4 08/11/2018 0811   HGB 12.9 12/07/2017 1125   HCT 38.5 08/11/2018 0811   HCT 37.7 12/07/2017 1125   PLT 247 08/11/2018 0811   PLT 251 01/13/2017 1604   MCV 92.5 08/11/2018 0811   MCV 88 12/07/2017 1125   MCH 29.8 08/11/2018 0811   MCHC 32.2 08/11/2018 0811   RDW 13.5 08/11/2018 0811   RDW 14.3 12/07/2017 1125   LYMPHSABS 3.0 12/07/2017 1125   EOSABS 0.2 12/07/2017 1125   BASOSABS 0.0 12/07/2017 1125   Iron/TIBC/Ferritin/ %Sat No results found for: IRON, TIBC, FERRITIN, IRONPCTSAT Lipid Panel     Component Value Date/Time   CHOL 165 09/12/2018 0808   TRIG 66 09/12/2018 0808    HDL 43 09/12/2018 0808   CHOLHDL 3.4 01/13/2017 1604   LDLCALC 109 (H) 09/12/2018 0808   Hepatic Function Panel     Component Value Date/Time   PROT 7.8 12/07/2017 1125   ALBUMIN 4.2 12/07/2017 1125   AST 15 12/07/2017 1125   ALT 15 12/07/2017 1125   ALKPHOS 61 12/07/2017 1125   BILITOT 0.5 12/07/2017 1125      Component Value Date/Time   TSH 0.804 12/07/2017 1125   TSH 1.090 01/13/2017 1604   Results for Bressman, Raven N "Tereasa Flaming" (MRN [id number]) as of 02/21/2019 10:49  Ref. Range 09/12/2018 08:08  Vitamin D, 25-Hydroxy Latest Ref Range: 30.0 - 100.0 ng/mL 18.3 (L)   I, Marianna Payment, am acting as Energy manager for [place], FNP-C.  I have reviewed the above documentation for accuracy and completeness, and I agree with the above.  - Shaylynne Lunt, FNP-C.

## 2019-02-22 ENCOUNTER — Encounter (INDEPENDENT_AMBULATORY_CARE_PROVIDER_SITE_OTHER): Payer: Self-pay | Admitting: Family Medicine

## 2019-02-28 DIAGNOSIS — I10 Essential (primary) hypertension: Secondary | ICD-10-CM | POA: Diagnosis not present

## 2019-02-28 DIAGNOSIS — E559 Vitamin D deficiency, unspecified: Secondary | ICD-10-CM | POA: Diagnosis not present

## 2019-02-28 DIAGNOSIS — E8881 Metabolic syndrome: Secondary | ICD-10-CM | POA: Diagnosis not present

## 2019-03-01 LAB — COMPREHENSIVE METABOLIC PANEL
ALT: 12 IU/L (ref 0–32)
AST: 10 IU/L (ref 0–40)
Albumin/Globulin Ratio: 1.3 (ref 1.2–2.2)
Albumin: 4.4 g/dL (ref 3.8–4.8)
Alkaline Phosphatase: 57 IU/L (ref 39–117)
BUN/Creatinine Ratio: 10 (ref 9–23)
BUN: 8 mg/dL (ref 6–20)
Bilirubin Total: 0.4 mg/dL (ref 0.0–1.2)
CO2: 22 mmol/L (ref 20–29)
Calcium: 9.5 mg/dL (ref 8.7–10.2)
Chloride: 106 mmol/L (ref 96–106)
Creatinine, Ser: 0.79 mg/dL (ref 0.57–1.00)
GFR calc Af Amer: 113 mL/min/{1.73_m2} (ref >59)
GFR calc non Af Amer: 98 mL/min/{1.73_m2} (ref >59)
Globulin, Total: 3.3 g/dL (ref 1.5–4.5)
Glucose: 86 mg/dL (ref 65–99)
Potassium: 4.2 mmol/L (ref 3.5–5.2)
Sodium: 141 mmol/L (ref 134–144)
Total Protein: 7.7 g/dL (ref 6.0–8.5)

## 2019-03-01 LAB — T3: T3, Total: 93 ng/dL (ref 71–180)

## 2019-03-01 LAB — VITAMIN D 25 HYDROXY (VIT D DEFICIENCY, FRACTURES): Vit D, 25-Hydroxy: 28.9 ng/mL — ABNORMAL LOW (ref 30.0–100.0)

## 2019-03-01 LAB — TSH: TSH: 1.43 u[IU]/mL (ref 0.450–4.500)

## 2019-03-01 LAB — HEMOGLOBIN A1C
Est. average glucose Bld gHb Est-mCnc: 103 mg/dL
Hgb A1c MFr Bld: 5.2 % (ref 4.8–5.6)

## 2019-03-01 LAB — INSULIN, RANDOM: INSULIN: 17.9 u[IU]/mL (ref 2.6–24.9)

## 2019-03-01 LAB — T4, FREE: Free T4: 0.94 ng/dL (ref 0.82–1.77)

## 2019-03-07 ENCOUNTER — Other Ambulatory Visit: Payer: Self-pay

## 2019-03-07 ENCOUNTER — Ambulatory Visit (INDEPENDENT_AMBULATORY_CARE_PROVIDER_SITE_OTHER): Payer: BLUE CROSS/BLUE SHIELD | Admitting: Family Medicine

## 2019-03-07 ENCOUNTER — Encounter (INDEPENDENT_AMBULATORY_CARE_PROVIDER_SITE_OTHER): Payer: Self-pay | Admitting: Family Medicine

## 2019-03-07 DIAGNOSIS — E669 Obesity, unspecified: Secondary | ICD-10-CM | POA: Diagnosis not present

## 2019-03-07 DIAGNOSIS — F3289 Other specified depressive episodes: Secondary | ICD-10-CM | POA: Diagnosis not present

## 2019-03-07 DIAGNOSIS — Z6830 Body mass index (BMI) 30.0-30.9, adult: Secondary | ICD-10-CM

## 2019-03-07 DIAGNOSIS — E8881 Metabolic syndrome: Secondary | ICD-10-CM

## 2019-03-07 NOTE — Progress Notes (Signed)
Office: [phone]  /  Fax: [phone] TeleHealth Visit:  Raven Walters has verbally consented to this TeleHealth visit today. The patient is located at home, the provider is located at the [facility] office. The participants in this visit include the listed provider and patient. The visit was conducted today via FaceTime.  HPI:   Chief Complaint: OBESITY Raven Walters is here to discuss her progress with her obesity treatment plan. She is on the Category 3 plan and is following her eating plan approximately 60-75% of the time. She states she is walking 45 minutes 3 times per week. Raven Walters likes the plan and is doing well on the plan. She reports sticking to the plan the majority of the time. We were unable to weigh the patient today for this TeleHealth visit. She feels as if she has maintained her weight since her last visit. She has lost 24 lbs since starting treatment with [place].  Insulin Resistance Raven Walters has a diagnosis of insulin resistance based on her elevated fasting insulin level >5. Although Raven Walters's blood glucose readings are still under good control, insulin resistance puts her at greater risk of metabolic syndrome and diabetes. She is taking metformin currently and continues to work on diet and exercise to decrease risk of diabetes. No polyphagia. Lab Results  Component Value Date   HGBA1C 5.2 02/28/2019    Depression with emotional eating behaviors Raven Walters is struggling with emotional eating and using food for comfort to the extent that it is negatively impacting her health. Raven Walters reports her cravings are well controlled with Topamax and she denies side effects. She does have an IUD and is aware that Topamax is teratogenic.  Depression screen Raven Walters 2/9 06/22/2018 02/16/2018 12/07/2017 06/25/2017 06/18/2017  Decreased Interest 0 0 3 0 0  Down, Depressed, Hopeless 0 0 1 0 0  PHQ - 2 Score 0 0 4 0 0  Altered sleeping - - 3 - -  Tired, decreased energy - - 3 - -  Change  in appetite - - 3 - -  Feeling bad or failure about yourself  - - 2 - -  Trouble concentrating - - 3 - -  Moving slowly or fidgety/restless - - 1 - -  Suicidal thoughts - - 0 - -  PHQ-9 Score - - 19 - -  Difficult doing work/chores - - Not difficult at all - -   ASSESSMENT AND PLAN:  Insulin resistance  Other depression - with emotional eating  Class 1 obesity with serious comorbidity and body mass index (BMI) of 30.0 to 30.9 in adult, unspecified obesity type  PLAN:  Insulin Resistance Raven Walters will continue to work on weight loss, exercise, and decreasing simple carbohydrates in her diet to help decrease the risk of diabetes. We dicussed metformin including benefits and risks. She was informed that eating too many simple carbohydrates or too many calories at one sitting increases the likelihood of GI side effects. Raven Walters will continue metformin (no prescription needed) and she agrees to follow-up with [place] as directed to monitor her progress.  Depression with Emotional Eating Behaviors We discussed behavior modification techniques today to help Raven Walters deal with her emotional eating and depression. She will continue Topamax (no prescription needed) and agrees to follow-up as directed.  I spent > than 50% of the 15 minute visit on counseling as documented in the note.  Obesity Raven Walters is currently in the action stage of change. As such, her goal is to continue with weight loss efforts.  She has agreed to follow the Category 3 plan. Raven Walters has been instructed to continue her current exercise regimen for weight loss and overall health benefits. We discussed the following Behavioral Modification Strategies today: planning for success.  Raven Walters has agreed to follow-up with our clinic in 2 weeks. She was informed of the importance of frequent follow-up visits to maximize her success with intensive lifestyle modifications for her multiple health conditions.  ALLERGIES: No Known Allergies   MEDICATIONS: Current Outpatient Medications on File Prior to Visit  Medication Sig Dispense Refill  . escitalopram (LEXAPRO) 10 MG tablet TAKE 1/2 TABLET FOR 3 DAYS THEN INCREASE TO ONE TABLET BY MOUTH DAILY 90 tablet 3  . fluticasone (FLONASE) 50 MCG/ACT nasal spray Place 2 sprays into both nostrils daily. 16 g 6  . labetalol (NORMODYNE) 100 MG tablet Take 1 tablet (100 mg total) by mouth 2 (two) times daily. 60 tablet 0  . levonorgestrel (MIRENA, 52 MG,) 20 MCG/24HR IUD 1 Device by Intrauterine route once. [id number]    . metFORMIN (GLUCOPHAGE) 500 MG tablet Take 1 tablet (500 mg total) by mouth 2 (two) times daily with a meal. 60 tablet 0  . topiramate (TOPAMAX) 50 MG tablet Take 1 tablet (50 mg total) by mouth daily. 30 tablet 0  . Vitamin D, Ergocalciferol, (DRISDOL) 1.25 MG (50000 UT) CAPS capsule Take 1 capsule (50,000 Units total) by mouth every 3 (three) days. 10 capsule 0   No current facility-administered medications on file prior to visit.     PAST MEDICAL HISTORY: Past Medical History:  Diagnosis Date  . Anxiety   . Asthma    pt states it is induced by excercise  . Chest pain 02/07/2017  . Dyspnea   . Essential hypertension 02/07/2017  . Headache   . Hx MRSA infection   . Infection    UTI  . Palpitations 02/07/2017  . Preeclampsia   . PVC (premature ventricular contraction) 03/18/2017    PAST SURGICAL HISTORY: Past Surgical History:  Procedure Laterality Date  . NO PAST SURGERIES      SOCIAL HISTORY: Social History   Tobacco Use  . Smoking status: Never Smoker  . Smokeless tobacco: Never Used  Substance Use Topics  . Alcohol use: Yes    Comment: social  . Drug use: No    FAMILY HISTORY: Family History  Problem Relation Age of Onset  . Hypertension Mother   . Cardiomyopathy Mother   . Heart disease Mother   . Anxiety disorder Mother   . Obesity Mother   . Hypertension Father   . Heart disease Father   . Hyperlipidemia Father   . Miscarriages /  [place] Son   . Hypertension Brother   . Diabetes Maternal Grandmother   . Heart disease Maternal Grandfather   . Hypertension Maternal Grandfather   . Stroke Maternal Grandfather   . Heart disease Paternal Grandmother   . Hypertension Paternal Grandmother   . Stroke Paternal Grandmother   . Alzheimer's disease Paternal Grandmother   . Hearing loss Neg Hx    ROS: Review of Systems  Endo/Heme/Allergies:       Negative for polyphagia.  Psychiatric/Behavioral: Positive for depression (emotional eating). Negative for suicidal ideas.       Negative for homicidal ideas.   PHYSICAL EXAM: Pt in no acute distress  RECENT LABS AND TESTS: BMET    Component Value Date/Time   NA 141 02/28/2019 1034   K 4.2 02/28/2019 1034   CL 106 02/28/2019 1034  CO2 22 02/28/2019 1034   GLUCOSE 86 02/28/2019 1034   GLUCOSE 95 08/11/2018 0811   BUN 8 02/28/2019 1034   CREATININE 0.79 02/28/2019 1034   CALCIUM 9.5 02/28/2019 1034   GFRNONAA 98 02/28/2019 1034   GFRAA 113 02/28/2019 1034   Lab Results  Component Value Date   HGBA1C 5.2 02/28/2019   HGBA1C 5.2 09/12/2018   HGBA1C 5.4 12/07/2017   Lab Results  Component Value Date   INSULIN 17.9 02/28/2019   INSULIN 36.9 (H) 09/12/2018   INSULIN 13.7 12/07/2017   CBC    Component Value Date/Time   WBC 9.5 08/11/2018 0811   RBC 4.16 08/11/2018 0811   HGB 12.4 08/11/2018 0811   HGB 12.9 12/07/2017 1125   HCT 38.5 08/11/2018 0811   HCT 37.7 12/07/2017 1125   PLT 247 08/11/2018 0811   PLT 251 01/13/2017 1604   MCV 92.5 08/11/2018 0811   MCV 88 12/07/2017 1125   MCH 29.8 08/11/2018 0811   MCHC 32.2 08/11/2018 0811   RDW 13.5 08/11/2018 0811   RDW 14.3 12/07/2017 1125   LYMPHSABS 3.0 12/07/2017 1125   EOSABS 0.2 12/07/2017 1125   BASOSABS 0.0 12/07/2017 1125   Iron/TIBC/Ferritin/ %Sat No results found for: IRON, TIBC, FERRITIN, IRONPCTSAT Lipid Panel     Component Value Date/Time   CHOL 165 09/12/2018 0808   TRIG 66  09/12/2018 0808   HDL 43 09/12/2018 0808   CHOLHDL 3.4 01/13/2017 1604   LDLCALC 109 (H) 09/12/2018 0808   Hepatic Function Panel     Component Value Date/Time   PROT 7.7 02/28/2019 1034   ALBUMIN 4.4 02/28/2019 1034   AST 10 02/28/2019 1034   ALT 12 02/28/2019 1034   ALKPHOS 57 02/28/2019 1034   BILITOT 0.4 02/28/2019 1034      Component Value Date/Time   TSH 1.430 02/28/2019 1034   TSH 0.804 12/07/2017 1125   TSH 1.090 01/13/2017 1604   Results for Vigil, Rabecca N "Aesha Eaves" (MRN [id number]) as of 03/07/2019 15:58  Ref. Range 02/28/2019 10:34  Vitamin D, 25-Hydroxy Latest Ref Range: 30.0 - 100.0 ng/mL 28.9 (L)   I, Marianna Paymentenise Haag, am acting as Energy managertranscriptionist for [place] , FNP-C.  I have reviewed the above documentation for accuracy and completeness, and I agree with the above.  -  , FNP-C.

## 2019-03-08 ENCOUNTER — Encounter (INDEPENDENT_AMBULATORY_CARE_PROVIDER_SITE_OTHER): Payer: Self-pay | Admitting: Family Medicine

## 2019-03-13 ENCOUNTER — Telehealth (INDEPENDENT_AMBULATORY_CARE_PROVIDER_SITE_OTHER): Payer: BLUE CROSS/BLUE SHIELD | Admitting: Emergency Medicine

## 2019-03-13 ENCOUNTER — Ambulatory Visit: Payer: Self-pay | Admitting: *Deleted

## 2019-03-13 ENCOUNTER — Encounter: Payer: Self-pay | Admitting: Emergency Medicine

## 2019-03-13 DIAGNOSIS — K529 Noninfective gastroenteritis and colitis, unspecified: Secondary | ICD-10-CM

## 2019-03-13 DIAGNOSIS — R112 Nausea with vomiting, unspecified: Secondary | ICD-10-CM

## 2019-03-13 MED ORDER — ONDANSETRON HCL 4 MG PO TABS: 4.0000 mg | ORAL_TABLET | Freq: Three times a day (TID) | ORAL | 0 refills | Status: AC | PRN

## 2019-03-13 NOTE — Telephone Encounter (Signed)
Message from Geronimo Boot sent at 03/13/2019 9:54 AM EDT   Summary: Clinical Advice   Patient feels that she has food poisoning. Patient is experiencing nausea,diarrhea, vomiting, fatigue and headache. Patient is only able to keep water down. Patient would like OTC recommendations. Please advice.

## 2019-03-13 NOTE — Telephone Encounter (Signed)
Returned all to patient regarding her symptoms and request for medication over the counter. She is requesting for a medication for nausea. She thinks it is food poisoning from seafood that she ate on Friday. She stated no one else is sick around her.  She denies fever. No abd pain.  Can not keep food down, only water. Advised pt that she needs an appointment for medication and the provider would need to know what is causing her to be ill.  Advised of having a virtual appointment, pt voiced understanding. Flow notified at PCP for an appointment. Call conference in with patient. Routing to flow for review. Reason for Disposition . [1] MILD or MODERATE vomiting AND [2] present > 48 hours (2 days) (Exception: mild vomiting with associated diarrhea)  Answer Assessment - Initial Assessment Questions 1. VOMITING SEVERITY: "How many times have you vomited in the past 24 hours?"     - MILD:  1 - 2 times/day    - MODERATE: 3 - 5 times/day, decreased oral intake without significant weight loss or symptoms of dehydration    - SEVERE: 6 or more times/day, vomits everything or nearly everything, with significant weight loss, symptoms of dehydration      moderate 2. ONSET: "When did the vomiting begin?"      Saturday 3. FLUIDS: "What fluids or food have you vomited up today?" "Have you been able to keep any fluids down?"     Can not keep anything down 4. ABDOMINAL PAIN: "Are your having any abdominal pain?" If yes : "How bad is it and what does it feel like?" (e.g., crampy, dull, intermittent, constant)      No cramping 5. DIARRHEA: "Is there any diarrhea?" If so, ask: "How many times today?"      Diarrhea maybe once in the last 24 hours 6. CONTACTS: "Is there anyone else in the family with the same symptoms?"      no 7. CAUSE: "What do you think is causing your vomiting?"     Food poisoning  8. HYDRATION STATUS: "Any signs of dehydration?" (e.g., dry mouth [not only dry lips], too weak to stand)  "When did you last urinate?"     No dehydration, last urination this morning 9. OTHER SYMPTOMS: "Do you have any other symptoms?" (e.g., fever, headache, vertigo, vomiting blood or coffee grounds, recent head injury)     Headache 10. PREGNANCY: "Is there any chance you are pregnant?" "When was your last menstrual period?"       Not pregnant  Has IUD  Protocols used: [facility]

## 2019-03-13 NOTE — Progress Notes (Signed)
Telemedicine Encounter- SOAP NOTE Established Patient  This telephone encounter was conducted with the patient's (or proxy's) verbal consent via audio telecommunications: yes/no: Yes Patient was instructed to have this encounter in a suitably private space; and to only have persons present to whom they give permission to participate. In addition, patient identity was confirmed by use of name plus two identifiers (DOB and address).  I discussed the limitations, risks, security and privacy concerns of performing an evaluation and management service by telephone and the availability of in person appointments. I also discussed with the patient that there may be a patient responsible charge related to this service. The patient expressed understanding and agreed to proceed.  I spent a total of TIME; 0 MIN TO 60 MIN: 15 minutes talking with the patient or their proxy.  No chief complaint on file. "Food poisoning"  Subjective   Raven Walters is a 33 y.o. established patient. Telephone visit today complaining of nausea vomiting and diarrhea that started 2 days ago after eating seafood today before.  6-year-old son also sick with diarrhea.  Emesis   This is a new problem. The current episode started in the past 7 days. The problem occurs intermittently. The problem has been gradually improving. The emesis has an appearance of stomach contents. There has been no fever. Associated symptoms include abdominal pain, diarrhea and headaches. Pertinent negatives include no arthralgias, chest pain, chills, coughing, dizziness, fever, myalgias, URI or weight loss. Risk factors include suspect food intake (Seafood). She has tried diet change and increased fluids for the symptoms. The treatment provided mild relief.     Patient Active Problem List   Diagnosis Date Noted  . Depression 01/17/2019  . Class 1 obesity with serious comorbidity and body mass index (BMI) of 30.0 to 30.9 in adult 12/22/2018  .  Vitamin D deficiency 12/21/2017  . Insulin resistance 12/21/2017  . PVC (premature ventricular contraction) 03/18/2017  . Essential hypertension 02/07/2017    Past Medical History:  Diagnosis Date  . Anxiety   . Asthma    pt states it is induced by excercise  . Chest pain 02/07/2017  . Dyspnea   . Essential hypertension 02/07/2017  . Headache   . Hx MRSA infection   . Infection    UTI  . Palpitations 02/07/2017  . Preeclampsia   . PVC (premature ventricular contraction) 03/18/2017    Current Outpatient Medications  Medication Sig Dispense Refill  . escitalopram (LEXAPRO) 10 MG tablet TAKE 1/2 TABLET FOR 3 DAYS THEN INCREASE TO ONE TABLET BY MOUTH DAILY 90 tablet 3  . fluticasone (FLONASE) 50 MCG/ACT nasal spray Place 2 sprays into both nostrils daily. 16 g 6  . labetalol (NORMODYNE) 100 MG tablet Take 1 tablet (100 mg total) by mouth 2 (two) times daily. 60 tablet 0  . levonorgestrel (MIRENA, 52 MG,) 20 MCG/24HR IUD 1 Device by Intrauterine route once. [id number]    . metFORMIN (GLUCOPHAGE) 500 MG tablet Take 1 tablet (500 mg total) by mouth 2 (two) times daily with a meal. 60 tablet 0  . topiramate (TOPAMAX) 50 MG tablet Take 1 tablet (50 mg total) by mouth daily. 30 tablet 0  . Vitamin D, Ergocalciferol, (DRISDOL) 1.25 MG (50000 UT) CAPS capsule Take 1 capsule (50,000 Units total) by mouth every 3 (three) days. 10 capsule 0   No current facility-administered medications for this visit.     No Known Allergies  Social History   Socioeconomic History  . Marital status: Married  Spouse name: Oletha Blendhomas Arpin  . Number of children: 2  . Years of education: Not on file  . Highest education level: Not on file  Occupational History  . Not on file  Social Needs  . Financial resource strain: Not on file  . Food insecurity:    Worry: Not on file    Inability: Not on file  . Transportation needs:    Medical: Not on file    Non-medical: Not on file  Tobacco Use  . Smoking status:  Never Smoker  . Smokeless tobacco: Never Used  Substance and Sexual Activity  . Alcohol use: Yes    Comment: social  . Drug use: No  . Sexual activity: Yes    Birth control/protection: I.U.D.  Lifestyle  . Physical activity:    Days per week: Not on file    Minutes per session: Not on file  . Stress: Not on file  Relationships  . Social connections:    Talks on phone: Not on file    Gets together: Not on file    Attends religious service: Not on file    Active member of club or organization: Not on file    Attends meetings of clubs or organizations: Not on file    Relationship status: Not on file  . Intimate partner violence:    Fear of current or ex partner: Not on file    Emotionally abused: Not on file    Physically abused: Not on file    Forced sexual activity: Not on file  Other Topics Concern  . Not on file  Social History Narrative  . Not on file    Review of Systems  Constitutional: Negative for chills, fever and weight loss.  HENT: Negative.  Negative for sinus pain and sore throat.   Eyes: Negative.   Respiratory: Negative.  Negative for cough and shortness of breath.   Cardiovascular: Negative for chest pain and palpitations.  Gastrointestinal: Positive for abdominal pain, diarrhea, nausea and vomiting. Negative for blood in stool, heartburn and melena.  Genitourinary: Negative for dysuria, flank pain and hematuria.  Musculoskeletal: Negative for arthralgias and myalgias.  Skin: Negative.  Negative for rash.  Neurological: Positive for headaches. Negative for dizziness.  Endo/Heme/Allergies: Negative.   All other systems reviewed and are negative.   Objective   Vitals as reported by the patient: None available Awake and oriented x3 in no apparent respiratory distress There were no vitals filed for this visit.  There are no diagnoses linked to this encounter. Diagnoses and all orders for this visit:  Nausea and vomiting in adult patient -      ondansetron (ZOFRAN) 4 MG tablet; Take 1 tablet (4 mg total) by mouth every 8 (eight) hours as needed for nausea or vomiting.  Acute gastroenteritis  Clinically stable.  No medical concerns identified on this visit.  No red flag signs or symptoms. Advised about dehydration and how to stay well-hydrated.  Will control nausea with Zofran.  Brat diet explained. Office visit if no better in 48 hours.   I discussed the assessment and treatment plan with the patient. The patient was provided an opportunity to ask questions and all were answered. The patient agreed with the plan and demonstrated an understanding of the instructions.   The patient was advised to call back or seek an in-person evaluation if the symptoms worsen or if the condition fails to improve as anticipated.  I provided 15 minutes of non-face-to-face time during this encounter.  Horald Pollen, MD  Primary Care at [facility]

## 2019-03-13 NOTE — Progress Notes (Signed)
Pt c/o possible food poisoning since Saturday. Ate some seafood and is now feeling nausea, vomiting, and fatigue.

## 2019-03-28 ENCOUNTER — Ambulatory Visit (INDEPENDENT_AMBULATORY_CARE_PROVIDER_SITE_OTHER): Payer: BLUE CROSS/BLUE SHIELD | Admitting: Family Medicine

## 2019-03-28 ENCOUNTER — Encounter (INDEPENDENT_AMBULATORY_CARE_PROVIDER_SITE_OTHER): Payer: Self-pay

## 2019-04-06 ENCOUNTER — Ambulatory Visit: Payer: BLUE CROSS/BLUE SHIELD | Admitting: Family Medicine

## 2019-04-27 ENCOUNTER — Encounter (INDEPENDENT_AMBULATORY_CARE_PROVIDER_SITE_OTHER): Payer: Self-pay | Admitting: Bariatrics

## 2019-04-27 ENCOUNTER — Other Ambulatory Visit: Payer: Self-pay

## 2019-04-27 ENCOUNTER — Ambulatory Visit (INDEPENDENT_AMBULATORY_CARE_PROVIDER_SITE_OTHER): Payer: BC Managed Care – PPO | Admitting: Bariatrics

## 2019-04-27 DIAGNOSIS — E559 Vitamin D deficiency, unspecified: Secondary | ICD-10-CM

## 2019-04-27 DIAGNOSIS — Z6830 Body mass index (BMI) 30.0-30.9, adult: Secondary | ICD-10-CM | POA: Diagnosis not present

## 2019-04-27 DIAGNOSIS — E669 Obesity, unspecified: Secondary | ICD-10-CM

## 2019-04-27 DIAGNOSIS — E8881 Metabolic syndrome: Secondary | ICD-10-CM | POA: Diagnosis not present

## 2019-04-27 MED ORDER — METFORMIN HCL 500 MG PO TABS: 500.0000 mg | ORAL_TABLET | Freq: Two times a day (BID) | ORAL | 0 refills | Status: AC

## 2019-04-27 MED ORDER — VITAMIN D (ERGOCALCIFEROL) 1.25 MG (50000 UNIT) PO CAPS: 50000.0000 [IU] | ORAL_CAPSULE | ORAL | 0 refills | Status: AC

## 2019-05-01 ENCOUNTER — Encounter (INDEPENDENT_AMBULATORY_CARE_PROVIDER_SITE_OTHER): Payer: Self-pay | Admitting: Bariatrics

## 2019-05-01 NOTE — Progress Notes (Signed)
Office: [phone]  /  Fax: [phone] TeleHealth Visit:  Raven Walters has verbally consented to this TeleHealth visit today. The patient is located at home, the provider is located at the [facility] office. The participants in this visit include the listed provider and patient and any and all parties involved. The visit was conducted today via FaceTime.  HPI:   Chief Complaint: OBESITY Raven Walters is here to discuss her progress with her obesity treatment plan. She is on the Category 3 plan and is following her eating plan approximately 75 % of the time. She states she is walking 60 minutes 2 times per week and doing step class 20 minutes 2 times per week. Raven Walters states that she has lost 10 pounds (weight 182 lbs). Her last visit was 03/05/19. She is cooking more and eating out less. She denies stress eating. We were unable to weigh the patient today for this TeleHealth visit. She feels as if she has lost weight since her last visit. She has lost 38 lbs since starting treatment with Raven Walters.  Vitamin D deficiency Raven Walters has a diagnosis of vitamin D deficiency. She is currently taking vit D and denies nausea, vomiting or muscle weakness.  Insulin Resistance Raven Walters has a diagnosis of insulin resistance based on her elevated fasting insulin level >5. Although Raven Walters's blood glucose readings are still under good control, insulin resistance puts her at greater risk of metabolic syndrome and diabetes. Her last A1c was at 5.2 and last insulin level was at 17.9 She is taking metformin currently and continues to work on diet and exercise to decrease risk of diabetes.  ASSESSMENT AND PLAN:  Vitamin D deficiency - Plan: Vitamin D, Ergocalciferol, (DRISDOL) 1.25 MG (50000 UT) CAPS capsule  Insulin resistance - Plan: metFORMIN (GLUCOPHAGE) 500 MG tablet  Class 1 obesity with serious comorbidity and body mass index (BMI) of 30.0 to 30.9 in adult, unspecified obesity type  PLAN:  Vitamin  D Deficiency Raven Walters was informed that low vitamin D levels contributes to fatigue and are associated with obesity, breast, and colon cancer. She agrees to continue to take prescription Vit D @50 ,000 IU every three days #10 with no refills and will follow up for routine testing of vitamin D, at least 2-3 times per year. She was informed of the risk of over-replacement of vitamin D and agrees to not increase her dose unless she discusses this with Raven Walters first. Raven Walters agrees to follow up as directed.  Insulin Resistance Raven Walters will continue to work on weight loss, exercise, and decreasing simple carbohydrates in her diet to help decrease the risk of diabetes. We dicussed metformin including benefits and risks. She was informed that eating too many simple carbohydrates or too many calories at one sitting increases the likelihood of GI side effects. Raven Walters agrees to continue metformin 500 mg BID with meals #60 with no refills and follow up with Raven Walters as directed to monitor her progress.  Obesity Raven Walters is currently in the action stage of change. As such, her goal is to continue with weight loss efforts She has agreed to follow the Category 3 plan Raven Walters has been instructed to work up to a goal of 150 minutes of combined cardio and strengthening exercise per week for weight loss and overall health benefits. We discussed the following Behavioral Modification Strategies today: increase H2O intake, no skipping meals, keeping healthy foods in the home, increasing lean protein intake, decreasing simple carbohydrates, increasing vegetables, decrease eating out and work on meal  planning and intentional eating Raven Walters will make good food choices.  Raven Walters has agreed to follow up with our clinic in 2 weeks. She was informed of the importance of frequent follow up visits to maximize her success with intensive lifestyle modifications for her multiple health conditions.  ALLERGIES: No Known Allergies  MEDICATIONS: Current  Outpatient Medications on File Prior to Visit  Medication Sig Dispense Refill  . escitalopram (LEXAPRO) 10 MG tablet TAKE 1/2 TABLET FOR 3 DAYS THEN INCREASE TO ONE TABLET BY MOUTH DAILY 90 tablet 3  . fluticasone (FLONASE) 50 MCG/ACT nasal spray Place 2 sprays into both nostrils daily. 16 g 6  . labetalol (NORMODYNE) 100 MG tablet Take 1 tablet (100 mg total) by mouth 2 (two) times daily. 60 tablet 0  . levonorgestrel (MIRENA, 52 MG,) 20 MCG/24HR IUD 1 Device by Intrauterine route once. [id number]    . ondansetron (ZOFRAN) 4 MG tablet Take 1 tablet (4 mg total) by mouth every 8 (eight) hours as needed for nausea or vomiting. 20 tablet 0  . topiramate (TOPAMAX) 50 MG tablet Take 1 tablet (50 mg total) by mouth daily. 30 tablet 0   No current facility-administered medications on file prior to visit.     PAST MEDICAL HISTORY: Past Medical History:  Diagnosis Date  . Anxiety   . Asthma    pt states it is induced by excercise  . Chest pain 02/07/2017  . Dyspnea   . Essential hypertension 02/07/2017  . Headache   . Hx MRSA infection   . Infection    UTI  . Palpitations 02/07/2017  . Preeclampsia   . PVC (premature ventricular contraction) 03/18/2017    PAST SURGICAL HISTORY: Past Surgical History:  Procedure Laterality Date  . NO PAST SURGERIES      SOCIAL HISTORY: Social History   Tobacco Use  . Smoking status: Never Smoker  . Smokeless tobacco: Never Used  Substance Use Topics  . Alcohol use: Yes    Comment: social  . Drug use: No    FAMILY HISTORY: Family History  Problem Relation Age of Onset  . Hypertension Mother   . Cardiomyopathy Mother   . Heart disease Mother   . Anxiety disorder Mother   . Obesity Mother   . Hypertension Father   . Heart disease Father   . Hyperlipidemia Father   . Miscarriages / [place] Son   . Hypertension Brother   . Diabetes Maternal Grandmother   . Heart disease Maternal Grandfather   . Hypertension Maternal Grandfather   . Stroke  Maternal Grandfather   . Heart disease Paternal Grandmother   . Hypertension Paternal Grandmother   . Stroke Paternal Grandmother   . Alzheimer's disease Paternal Grandmother   . Hearing loss Neg Hx     ROS: Review of Systems  Constitutional: Positive for weight loss.  Gastrointestinal: Negative for nausea and vomiting.  Musculoskeletal:       Negative for muscle weakness    PHYSICAL EXAM: Pt in no acute distress  RECENT LABS AND TESTS: BMET    Component Value Date/Time   NA 141 02/28/2019 1034   K 4.2 02/28/2019 1034   CL 106 02/28/2019 1034   CO2 22 02/28/2019 1034   GLUCOSE 86 02/28/2019 1034   GLUCOSE 95 08/11/2018 0811   BUN 8 02/28/2019 1034   CREATININE 0.79 02/28/2019 1034   CALCIUM 9.5 02/28/2019 1034   GFRNONAA 98 02/28/2019 1034   GFRAA 113 02/28/2019 1034   Lab Results  Component Value  Date   HGBA1C 5.2 02/28/2019   HGBA1C 5.2 09/12/2018   HGBA1C 5.4 12/07/2017   Lab Results  Component Value Date   INSULIN 17.9 02/28/2019   INSULIN 36.9 (H) 09/12/2018   INSULIN 13.7 12/07/2017   CBC    Component Value Date/Time   WBC 9.5 08/11/2018 0811   RBC 4.16 08/11/2018 0811   HGB 12.4 08/11/2018 0811   HGB 12.9 12/07/2017 1125   HCT 38.5 08/11/2018 0811   HCT 37.7 12/07/2017 1125   PLT 247 08/11/2018 0811   PLT 251 01/13/2017 1604   MCV 92.5 08/11/2018 0811   MCV 88 12/07/2017 1125   MCH 29.8 08/11/2018 0811   MCHC 32.2 08/11/2018 0811   RDW 13.5 08/11/2018 0811   RDW 14.3 12/07/2017 1125   LYMPHSABS 3.0 12/07/2017 1125   EOSABS 0.2 12/07/2017 1125   BASOSABS 0.0 12/07/2017 1125   Iron/TIBC/Ferritin/ %Sat No results found for: IRON, TIBC, FERRITIN, IRONPCTSAT Lipid Panel     Component Value Date/Time   CHOL 165 09/12/2018 0808   TRIG 66 09/12/2018 0808   HDL 43 09/12/2018 0808   CHOLHDL 3.4 01/13/2017 1604   LDLCALC 109 (H) 09/12/2018 0808   Hepatic Function Panel     Component Value Date/Time   PROT 7.7 02/28/2019 1034   ALBUMIN 4.4  02/28/2019 1034   AST 10 02/28/2019 1034   ALT 12 02/28/2019 1034   ALKPHOS 57 02/28/2019 1034   BILITOT 0.4 02/28/2019 1034      Component Value Date/Time   TSH 1.430 02/28/2019 1034   TSH 0.804 12/07/2017 1125   TSH 1.090 01/13/2017 1604     Ref. Range 02/28/2019 10:34  Vitamin D, 25-Hydroxy Latest Ref Range: 30.0 - 100.0 ng/mL 28.9 (L)    I, [place] CraneJoanne Murray, am acting as Energy managertranscriptionist for [employer]el A. Manson PasseyBrown, DO  I have reviewed the above documentation for accuracy and completeness, and I agree with the above. -Corinna CapraAngel Pawan Knechtel, DO

## 2019-05-16 ENCOUNTER — Other Ambulatory Visit: Payer: Self-pay

## 2019-05-16 ENCOUNTER — Ambulatory Visit (INDEPENDENT_AMBULATORY_CARE_PROVIDER_SITE_OTHER): Payer: BC Managed Care – PPO | Admitting: Physician Assistant

## 2019-05-16 ENCOUNTER — Encounter (INDEPENDENT_AMBULATORY_CARE_PROVIDER_SITE_OTHER): Payer: Self-pay | Admitting: Physician Assistant

## 2019-05-16 DIAGNOSIS — Z6830 Body mass index (BMI) 30.0-30.9, adult: Secondary | ICD-10-CM | POA: Diagnosis not present

## 2019-05-16 DIAGNOSIS — E669 Obesity, unspecified: Secondary | ICD-10-CM | POA: Diagnosis not present

## 2019-05-16 DIAGNOSIS — E559 Vitamin D deficiency, unspecified: Secondary | ICD-10-CM

## 2019-05-16 NOTE — Progress Notes (Signed)
Office: [phone]  /  Fax: [phone] TeleHealth Visit:  Raven Walters has verbally consented to this TeleHealth visit today. The patient is located at home, the provider is located at the [facility]. The participants in this visit include the listed provider and patient. The visit was conducted today via FaceTime.  HPI:   Chief Complaint: OBESITY Raven Walters is here to discuss her progress with her obesity treatment plan. She is on the Category 3 plan and is following her eating plan approximately 70% of the time. She states she is doing cardio 1 hour 3 times per week. Raven Walters reports that her most recent weight was 185 lbs. She is doing well overall with the plan. She is exercising more and drinking a lot of water. She denies excessive hunger. We were unable to weigh the patient today for this TeleHealth visit. She feels as if she has maintained her weight since her last visit. She has lost 24 lbs since starting treatment with [place].  Vitamin D deficiency Raven Walters has a diagnosis of Vitamin D deficiency. She is currently taking prescription Vit D twice weekly and denies nausea, vomiting or muscle weakness.  ASSESSMENT AND PLAN:  Vitamin D deficiency  Class 1 obesity with serious comorbidity and body mass index (BMI) of 30.0 to 30.9 in adult, unspecified obesity type  PLAN:  Vitamin D Deficiency Raven Walters was informed that low Vitamin D levels contributes to fatigue and are associated with obesity, breast, and colon cancer. She agrees to continue taking prescription Vit D twice weekly and will follow-up for routine testing of Vitamin D, at least 2-3 times per year. She was informed of the risk of over-replacement of Vitamin D and agrees to not increase her dose unless she discusses this with [place] first. Raven Walters agrees to follow-up with our clinic in 4 weeks.  Obesity Raven Walters is currently in the action stage of change. As such, her goal is to continue with weight loss  efforts. She has agreed to follow the Category 3 plan. Raven Walters has been instructed to work up to a goal of 150 minutes of combined cardio and strengthening exercise per week for weight loss and overall health benefits. We discussed the following Behavioral Modification Strategies today: work on meal planning, easy cooking plans, and keeping healthy foods in the home.  Raven Walters has agreed to follow-up with our clinic in 4 weeks. She was informed of the importance of frequent follow-up visits to maximize her success with intensive lifestyle modifications for her multiple health conditions.  ALLERGIES: No Known Allergies  MEDICATIONS: Current Outpatient Medications on File Prior to Visit  Medication Sig Dispense Refill   escitalopram (LEXAPRO) 10 MG tablet TAKE 1/2 TABLET FOR 3 DAYS THEN INCREASE TO ONE TABLET BY MOUTH DAILY 90 tablet 3   fluticasone (FLONASE) 50 MCG/ACT nasal spray Place 2 sprays into both nostrils daily. 16 g 6   labetalol (NORMODYNE) 100 MG tablet Take 1 tablet (100 mg total) by mouth 2 (two) times daily. 60 tablet 0   levonorgestrel (MIRENA, 52 MG,) 20 MCG/24HR IUD 1 Device by Intrauterine route once. 09-2017     metFORMIN (GLUCOPHAGE) 500 MG tablet Take 1 tablet (500 mg total) by mouth 2 (two) times daily with a meal. 60 tablet 0   ondansetron (ZOFRAN) 4 MG tablet Take 1 tablet (4 mg total) by mouth every 8 (eight) hours as needed for nausea or vomiting. 20 tablet 0   topiramate (TOPAMAX) 50 MG tablet Take 1 tablet (50 mg  total) by mouth daily. 30 tablet 0   Vitamin D, Ergocalciferol, (DRISDOL) 1.25 MG (50000 UT) CAPS capsule Take 1 capsule (50,000 Units total) by mouth every 3 (three) days. 10 capsule 0   No current facility-administered medications on file prior to visit.     PAST MEDICAL HISTORY: Past Medical History:  Diagnosis Date   Anxiety    Asthma    pt states it is induced by excercise   Chest pain 02/07/2017   Dyspnea    Essential hypertension  02/07/2017   Headache    Hx MRSA infection    Infection    UTI   Palpitations 02/07/2017   Preeclampsia    PVC (premature ventricular contraction) 03/18/2017    PAST SURGICAL HISTORY: Past Surgical History:  Procedure Laterality Date   NO PAST SURGERIES      SOCIAL HISTORY: Social History   Tobacco Use   Smoking status: Never Smoker   Smokeless tobacco: Never Used  Substance Use Topics   Alcohol use: Yes    Comment: social   Drug use: No    FAMILY HISTORY: Family History  Problem Relation Age of Onset   Hypertension Mother    Cardiomyopathy Mother    Heart disease Mother    Anxiety disorder Mother    Obesity Mother    Hypertension Father    Heart disease Father    Hyperlipidemia Father    Miscarriages / [place]    Hypertension Brother    Diabetes Maternal Grandmother    Heart disease Maternal Grandfather    Hypertension Maternal Grandfather    Stroke Maternal Grandfather    Heart disease Paternal Grandmother    Hypertension Paternal Grandmother    Stroke Paternal Grandmother    Alzheimer's disease Paternal Grandmother    Hearing loss Neg Hx    ROS: Review of Systems  Gastrointestinal: Negative for nausea and vomiting.  Musculoskeletal:       Negative for muscle weakness.   PHYSICAL EXAM: Pt in no acute distress  RECENT LABS AND TESTS: BMET    Component Value Date/Time   NA 141 02/28/2019 1034   K 4.2 02/28/2019 1034   CL 106 02/28/2019 1034   CO2 22 02/28/2019 1034   GLUCOSE 86 02/28/2019 1034   GLUCOSE 95 08/11/2018 0811   BUN 8 02/28/2019 1034   CREATININE 0.79 02/28/2019 1034   CALCIUM 9.5 02/28/2019 1034   GFRNONAA 98 02/28/2019 1034   GFRAA 113 02/28/2019 1034   Lab Results  Component Value Date   HGBA1C 5.2 02/28/2019   HGBA1C 5.2 09/12/2018   HGBA1C 5.4 12/07/2017   Lab Results  Component Value Date   INSULIN 17.9 02/28/2019   INSULIN 36.9 (H) 09/12/2018   INSULIN 13.7 12/07/2017   CBC      Component Value Date/Time   WBC 9.5 08/11/2018 0811   RBC 4.16 08/11/2018 0811   HGB 12.4 08/11/2018 0811   HGB 12.9 12/07/2017 1125   HCT 38.5 08/11/2018 0811   HCT 37.7 12/07/2017 1125   PLT 247 08/11/2018 0811   PLT 251 01/13/2017 1604   MCV 92.5 08/11/2018 0811   MCV 88 12/07/2017 1125   MCH 29.8 08/11/2018 0811   MCHC 32.2 08/11/2018 0811   RDW 13.5 08/11/2018 0811   RDW 14.3 12/07/2017 1125   LYMPHSABS 3.0 12/07/2017 1125   EOSABS 0.2 12/07/2017 1125   BASOSABS 0.0 12/07/2017 1125   Iron/TIBC/Ferritin/ %Sat No results found for: IRON, TIBC, FERRITIN, IRONPCTSAT Lipid Panel  Component Value Date/Time   CHOL 165 09/12/2018 0808   TRIG 66 09/12/2018 0808   HDL 43 09/12/2018 0808   CHOLHDL 3.4 01/13/2017 1604   LDLCALC 109 (H) 09/12/2018 0808   Hepatic Function Panel     Component Value Date/Time   PROT 7.7 02/28/2019 1034   ALBUMIN 4.4 02/28/2019 1034   AST 10 02/28/2019 1034   ALT 12 02/28/2019 1034   ALKPHOS 57 02/28/2019 1034   BILITOT 0.4 02/28/2019 1034      Component Value Date/Time   TSH 1.430 02/28/2019 1034   TSH 0.804 12/07/2017 1125   TSH 1.090 01/13/2017 1604   Results for Scholler, Annamaria N "Darwin Downard" (MRN [id number]) as of 05/16/2019 15:12  Ref. Range 02/28/2019 10:34  Vitamin D, 25-Hydroxy Latest Ref Range: 30.0 - 100.0 ng/mL 28.9 (L)    I, Marianna Paymentenise Haag, am acting as Energy managertranscriptionist for [employer]y Aguilar, PA-C I, Alois Clicheracey Aguilar, PA-C have reviewed above note and agree with its content

## 2019-05-17 DIAGNOSIS — F418 Other specified anxiety disorders: Secondary | ICD-10-CM | POA: Diagnosis not present

## 2019-05-18 DIAGNOSIS — N920 Excessive and frequent menstruation with regular cycle: Secondary | ICD-10-CM | POA: Diagnosis not present

## 2019-05-22 ENCOUNTER — Other Ambulatory Visit (INDEPENDENT_AMBULATORY_CARE_PROVIDER_SITE_OTHER): Payer: Self-pay | Admitting: Bariatrics

## 2019-05-22 DIAGNOSIS — E8881 Metabolic syndrome: Secondary | ICD-10-CM

## 2019-05-23 ENCOUNTER — Other Ambulatory Visit: Payer: Self-pay | Admitting: *Deleted

## 2019-05-23 DIAGNOSIS — Z20822 Contact with and (suspected) exposure to covid-19: Secondary | ICD-10-CM

## 2019-05-25 DIAGNOSIS — F418 Other specified anxiety disorders: Secondary | ICD-10-CM | POA: Diagnosis not present

## 2019-05-28 LAB — NOVEL CORONAVIRUS, NAA: SARS-CoV-2, NAA: NOT DETECTED

## 2019-06-01 DIAGNOSIS — F418 Other specified anxiety disorders: Secondary | ICD-10-CM | POA: Diagnosis not present

## 2019-06-06 DIAGNOSIS — R635 Abnormal weight gain: Secondary | ICD-10-CM | POA: Diagnosis not present

## 2019-06-06 DIAGNOSIS — R5383 Other fatigue: Secondary | ICD-10-CM | POA: Diagnosis not present

## 2019-06-06 DIAGNOSIS — E538 Deficiency of other specified B group vitamins: Secondary | ICD-10-CM | POA: Diagnosis not present

## 2019-06-06 DIAGNOSIS — R0602 Shortness of breath: Secondary | ICD-10-CM | POA: Diagnosis not present

## 2019-06-06 DIAGNOSIS — Z1159 Encounter for screening for other viral diseases: Secondary | ICD-10-CM | POA: Diagnosis not present

## 2019-06-06 DIAGNOSIS — N946 Dysmenorrhea, unspecified: Secondary | ICD-10-CM | POA: Diagnosis not present

## 2019-06-09 DIAGNOSIS — F418 Other specified anxiety disorders: Secondary | ICD-10-CM | POA: Diagnosis not present

## 2019-06-21 DIAGNOSIS — F418 Other specified anxiety disorders: Secondary | ICD-10-CM | POA: Diagnosis not present

## 2019-06-23 DIAGNOSIS — F418 Other specified anxiety disorders: Secondary | ICD-10-CM | POA: Diagnosis not present

## 2019-07-05 DIAGNOSIS — F418 Other specified anxiety disorders: Secondary | ICD-10-CM | POA: Diagnosis not present

## 2019-07-11 DIAGNOSIS — F418 Other specified anxiety disorders: Secondary | ICD-10-CM | POA: Diagnosis not present

## 2019-07-19 DIAGNOSIS — F418 Other specified anxiety disorders: Secondary | ICD-10-CM | POA: Diagnosis not present

## 2019-07-24 DIAGNOSIS — F418 Other specified anxiety disorders: Secondary | ICD-10-CM | POA: Diagnosis not present

## 2019-07-31 DIAGNOSIS — R635 Abnormal weight gain: Secondary | ICD-10-CM | POA: Diagnosis not present

## 2019-08-09 DIAGNOSIS — F418 Other specified anxiety disorders: Secondary | ICD-10-CM | POA: Diagnosis not present

## 2019-08-23 DIAGNOSIS — F418 Other specified anxiety disorders: Secondary | ICD-10-CM | POA: Diagnosis not present

## 2019-08-30 ENCOUNTER — Encounter: Payer: Self-pay | Admitting: Family Medicine

## 2019-08-30 ENCOUNTER — Telehealth (INDEPENDENT_AMBULATORY_CARE_PROVIDER_SITE_OTHER): Payer: BC Managed Care – PPO | Admitting: Family Medicine

## 2019-08-30 DIAGNOSIS — I10 Essential (primary) hypertension: Secondary | ICD-10-CM

## 2019-08-30 DIAGNOSIS — F418 Other specified anxiety disorders: Secondary | ICD-10-CM | POA: Diagnosis not present

## 2019-08-30 MED ORDER — LABETALOL HCL 100 MG PO TABS: 100.0000 mg | ORAL_TABLET | Freq: Two times a day (BID) | ORAL | 0 refills | Status: DC

## 2019-08-30 NOTE — Progress Notes (Signed)
Needs refill on labetalol only, verified meds and pharmacy. Not having any other medical needs. No longer taking the lexapro

## 2019-08-30 NOTE — Progress Notes (Signed)
Unable to reach patient  Left message for patient to come in person for office visit. No showed

## 2019-08-31 ENCOUNTER — Encounter: Payer: Self-pay | Admitting: Family Medicine

## 2019-09-13 DIAGNOSIS — F418 Other specified anxiety disorders: Secondary | ICD-10-CM | POA: Diagnosis not present

## 2019-09-15 ENCOUNTER — Other Ambulatory Visit: Payer: Self-pay | Admitting: Family Medicine

## 2019-09-15 DIAGNOSIS — I10 Essential (primary) hypertension: Secondary | ICD-10-CM

## 2019-09-15 NOTE — Telephone Encounter (Signed)
Requested medication (s) are due for refill today: yes  Requested medication (s) are on the active medication list: yes  Last refill:  08/30/2019  Future visit scheduled:no  Notes to clinic:  Requesting 90 day with refill   Requested Prescriptions  Pending Prescriptions Disp Refills   labetalol (NORMODYNE) 100 MG tablet [Pharmacy Med Name: LABETALOL HCL 100 MG TABLET] 180 tablet 1    Sig: TAKE 1 TABLET BY MOUTH TWICE A DAY     Cardiovascular:  Beta Blockers Failed - 09/15/2019 11:50 AM      Failed - Valid encounter within last 6 months    Recent Outpatient Visits          2 weeks ago Essential hypertension   [facility] at [facility], Zoe A, MD   6 months ago Nausea and vomiting in adult patient   Primary Care at [facility], Ines Bloomer, MD   1 year ago Essential hypertension   Primary Care at [facility], [place], MD   1 year ago Essential hypertension   Primary Care at [facility], [place] A, MD   2 years ago Essential hypertension   [facility], Damaris Hippo, PA-C             Passed - Last BP in normal range    BP Readings from Last 1 Encounters:  01/16/19 131/88         Passed - Last Heart Rate in normal range    Pulse Readings from Last 1 Encounters:  01/16/19 89

## 2019-09-19 ENCOUNTER — Telehealth: Payer: Self-pay | Admitting: *Deleted

## 2019-09-19 NOTE — Telephone Encounter (Signed)
Called to schedule ,no answer Left voicemail for cb

## 2019-09-27 DIAGNOSIS — F418 Other specified anxiety disorders: Secondary | ICD-10-CM | POA: Diagnosis not present

## 2019-10-02 ENCOUNTER — Ambulatory Visit: Payer: BC Managed Care – PPO | Admitting: Family Medicine

## 2019-10-02 ENCOUNTER — Encounter: Payer: Self-pay | Admitting: Family Medicine

## 2019-10-02 ENCOUNTER — Other Ambulatory Visit: Payer: Self-pay

## 2019-10-02 VITALS — BP 128/80 | HR 99 | Temp 98.4°F | Ht 67.0 in | Wt 204.8 lb

## 2019-10-02 DIAGNOSIS — R7303 Prediabetes: Secondary | ICD-10-CM | POA: Diagnosis not present

## 2019-10-02 DIAGNOSIS — E663 Overweight: Secondary | ICD-10-CM

## 2019-10-02 DIAGNOSIS — E8881 Metabolic syndrome: Secondary | ICD-10-CM | POA: Diagnosis not present

## 2019-10-02 DIAGNOSIS — I10 Essential (primary) hypertension: Secondary | ICD-10-CM | POA: Diagnosis not present

## 2019-10-02 MED ORDER — LABETALOL HCL 100 MG PO TABS: 100.0000 mg | ORAL_TABLET | Freq: Two times a day (BID) | ORAL | 1 refills | Status: AC

## 2019-10-02 NOTE — Patient Instructions (Signed)
° ° ° °  If you have lab work done today you will be contacted with your lab results within the next 2 weeks.  If you have not heard from us then please contact us. The fastest way to get your results is to register for My Chart. ° ° °IF you received an x-ray today, you will receive an invoice from [facility]. Please contact [facility] at [phone] with questions or concerns regarding your invoice.  ° °IF you received labwork today, you will receive an invoice from [facility]. Please contact [facility] at [phone] with questions or concerns regarding your invoice.  ° °Our billing staff will not be able to assist you with questions regarding bills from these companies. ° °You will be contacted with the lab results as soon as they are available. The fastest way to get your results is to activate your My Chart account. Instructions are located on the last page of this paperwork. If you have not heard from us regarding the results in 2 weeks, please contact this office. °  ° ° ° °

## 2019-10-02 NOTE — Progress Notes (Signed)
Established Patient Office Visit  Subjective:  Patient ID: Raven Walters, female    DOB: 06/07/1986  Age: 33 y.o. MRN: [id number]  CC:  Chief Complaint  Patient presents with  . Hypertension    F/u - not taking BP meds   . Medication Refill    HPI Raven Marteslexis N Dowse presents for   Hypertension: Patient here for follow-up of elevated blood pressure.  She reports that she has not  She is working from home. She gets some high readings at home.  She gets readings of 140/90s at home. She states that stress seems to increase her blood pressure She is exercising.  [facility]  She had labs and a1c which was good She needs all her refills She was doing a weight loss medication BP Readings from Last 3 Encounters:  10/02/19 128/80  01/16/19 131/88  12/22/18 123/85   She is working with a weight loss program Wt Readings from Last 3 Encounters:  10/02/19 204 lb 12.8 oz (92.9 kg)  01/16/19 196 lb (88.9 kg)  12/22/18 195 lb (88.5 kg)     Past Medical History:  Diagnosis Date  . Anxiety   . Asthma    pt states it is induced by excercise  . Chest pain 02/07/2017  . Dyspnea   . Essential hypertension 02/07/2017  . Headache   . Hx MRSA infection   . Infection    UTI  . Palpitations 02/07/2017  . Preeclampsia   . PVC (premature ventricular contraction) 03/18/2017    Past Surgical History:  Procedure Laterality Date  . NO PAST SURGERIES      Family History  Problem Relation Age of Onset  . Hypertension Mother   . Cardiomyopathy Mother   . Heart disease Mother   . Anxiety disorder Mother   . Obesity Mother   . Hypertension Father   . Heart disease Father   . Hyperlipidemia Father   . Miscarriages / [place]   . Hypertension Brother   . Diabetes Maternal Grandmother   . Heart disease Maternal Grandfather   . Hypertension Maternal Grandfather   . Stroke Maternal Grandfather   . Heart disease Paternal Grandmother   . Hypertension  Paternal Grandmother   . Stroke Paternal Grandmother   . Alzheimer's disease Paternal Grandmother   . Hearing loss Neg Hx     Social History   Socioeconomic History  . Marital status: Married    Spouse name: Oletha Blendhomas Rens  . Number of children: 2  . Years of education: Not on file  . Highest education level: Not on file  Occupational History  . Not on file  Social Needs  . Financial resource strain: Not on file  . Food insecurity    Worry: Not on file    Inability: Not on file  . Transportation needs    Medical: Not on file    Non-medical: Not on file  Tobacco Use  . Smoking status: Never Smoker  . Smokeless tobacco: Never Used  Substance and Sexual Activity  . Alcohol use: Yes    Comment: social  . Drug use: No  . Sexual activity: Yes    Birth control/protection: I.U.D.  Lifestyle  . Physical activity    Days per week: Not on file    Minutes per session: Not on file  . Stress: Not on file  Relationships  . Social connections    Talks on phone: Not on file  Gets together: Not on file    Attends religious service: Not on file    Active member of club or organization: Not on file    Attends meetings of clubs or organizations: Not on file    Relationship status: Not on file  . Intimate partner violence    Fear of current or ex partner: Not on file    Emotionally abused: Not on file    Physically abused: Not on file    Forced sexual activity: Not on file  Other Topics Concern  . Not on file  Social History Narrative  . Not on file    Outpatient Medications Prior to Visit  Medication Sig Dispense Refill  . fluticasone (FLONASE) 50 MCG/ACT nasal spray Place 2 sprays into both nostrils daily. 16 g 6  . levonorgestrel (MIRENA, 52 MG,) 20 MCG/24HR IUD 1 Device by Intrauterine route once. [phone]    . metFORMIN (GLUCOPHAGE) 500 MG tablet Take 1 tablet (500 mg total) by mouth 2 (two) times daily with a meal. 60 tablet 0  . phentermine 37.5 MG capsule Take 37.5 mg  by mouth every morning.    . topiramate (TOPAMAX) 50 MG tablet Take 1 tablet (50 mg total) by mouth daily. 30 tablet 0  . Vitamin D, Ergocalciferol, (DRISDOL) 1.25 MG (50000 UT) CAPS capsule Take 1 capsule (50,000 Units total) by mouth every 3 (three) days. 10 capsule 0  . ondansetron (ZOFRAN) 4 MG tablet Take 1 tablet (4 mg total) by mouth every 8 (eight) hours as needed for nausea or vomiting. (Patient not taking: Reported on 10/02/2019) 20 tablet 0  . labetalol (NORMODYNE) 100 MG tablet TAKE 1 TABLET BY MOUTH TWICE A DAY (Patient not taking: Reported on 10/02/2019) 30 tablet 0   No facility-administered medications prior to visit.     No Known Allergies  ROS Review of Systems Review of Systems  Constitutional: Negative for activity change, appetite change, chills and fever.  HENT: Negative for congestion, nosebleeds, trouble swallowing and voice change.   Respiratory: Negative for cough, shortness of breath and wheezing.   Gastrointestinal: Negative for diarrhea, nausea and vomiting.  Genitourinary: Negative for difficulty urinating, dysuria, flank pain and hematuria.  Musculoskeletal: Negative for back pain, joint swelling and neck pain.  Neurological: Negative for dizziness, speech difficulty, light-headedness and numbness.  See HPI. All other review of systems negative.     Objective:    Physical Exam  BP 128/80   Pulse 99   Temp 98.4 F (36.9 C) (Oral)   Ht 5\' 7"  (1.702 m)   Wt 204 lb 12.8 oz (92.9 kg)   LMP 09/25/2019   SpO2 96%   BMI 32.08 kg/m  Wt Readings from Last 3 Encounters:  10/02/19 204 lb 12.8 oz (92.9 kg)  01/16/19 196 lb (88.9 kg)  12/22/18 195 lb (88.5 kg)     Health Maintenance Due  Topic Date Due  . PAP SMEAR-Modifier  12/17/2017  . INFLUENZA VACCINE  06/10/2019    There are no preventive care reminders to display for this patient.  Lab Results  Component Value Date   TSH 1.430 02/28/2019   Lab Results  Component Value Date   WBC 9.5  08/11/2018   HGB 12.4 08/11/2018   HCT 38.5 08/11/2018   MCV 92.5 08/11/2018   PLT 247 08/11/2018   Lab Results  Component Value Date   NA 141 02/28/2019   K 4.2 02/28/2019   CO2 22 02/28/2019   GLUCOSE 86 02/28/2019  BUN 8 02/28/2019   CREATININE 0.79 02/28/2019   BILITOT 0.4 02/28/2019   ALKPHOS 57 02/28/2019   AST 10 02/28/2019   ALT 12 02/28/2019   PROT 7.7 02/28/2019   ALBUMIN 4.4 02/28/2019   CALCIUM 9.5 02/28/2019   ANIONGAP 8 08/11/2018   Lab Results  Component Value Date   CHOL 165 09/12/2018   Lab Results  Component Value Date   HDL 43 09/12/2018   Lab Results  Component Value Date   LDLCALC 109 (H) 09/12/2018   Lab Results  Component Value Date   TRIG 66 09/12/2018   Lab Results  Component Value Date   CHOLHDL 3.4 01/13/2017   Lab Results  Component Value Date   HGBA1C 5.2 02/28/2019      Assessment & Plan:   Problem List Items Addressed This Visit      Cardiovascular and Mediastinum   Essential hypertension - Primary Patient's blood pressure is at goal of 139/89 or less. Condition is stable. Continue current medications and treatment plan. I recommend that you exercise for 30-45 minutes 5 days a week. I also recommend a balanced diet with fruits and vegetables every day, lean meats, and little fried foods. The DASH diet (you can find this online) is a good example of this.    Relevant Medications   labetalol (NORMODYNE) 100 MG tablet     Endocrine   Insulin resistance     Other   Class 1 obesity with serious comorbidity and body mass index (BMI) of 30.0 to 30.9 in adult   Relevant Medications   phentermine 37.5 MG capsule    Other Visit Diagnoses    Prediabetes    -  Improved sugars      Meds ordered this encounter  Medications  . labetalol (NORMODYNE) 100 MG tablet    Sig: Take 1 tablet (100 mg total) by mouth 2 (two) times daily.    Dispense:  90 tablet    Refill:  1    Follow-up: No follow-ups on file.    Forrest Moron, MD

## 2019-10-03 DIAGNOSIS — R635 Abnormal weight gain: Secondary | ICD-10-CM | POA: Diagnosis not present

## 2019-10-07 IMAGING — DX DG CHEST 2V
2 series · 2 of 2 positions shown · non-contrast
Comparison: None.

CLINICAL DATA: Chest pain and chest tightness beginning this
morning. History of hypertension, diabetes, asthma, and
palpitations.

EXAM:
CHEST - 2 VIEW

[w chest pa]
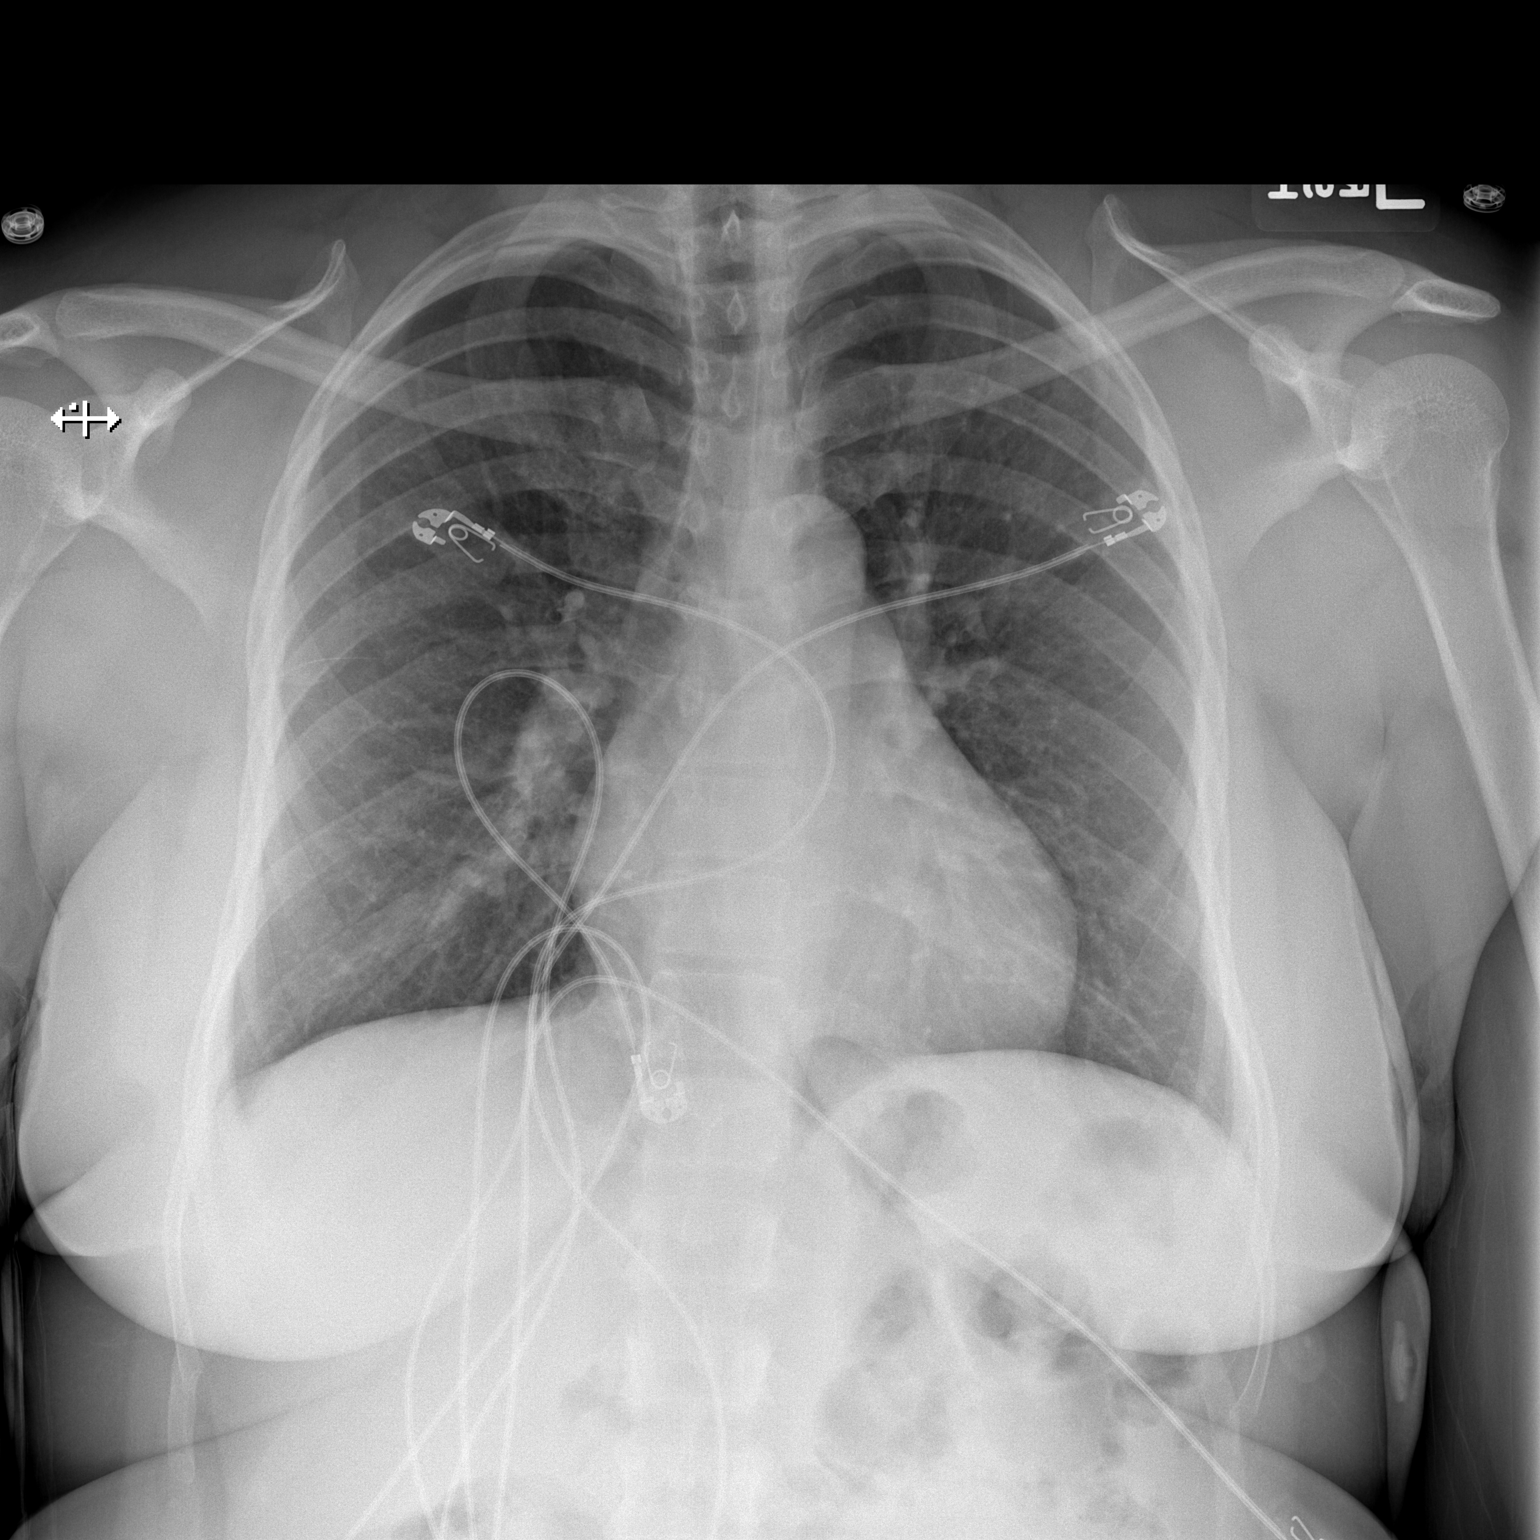

[w chest lat]
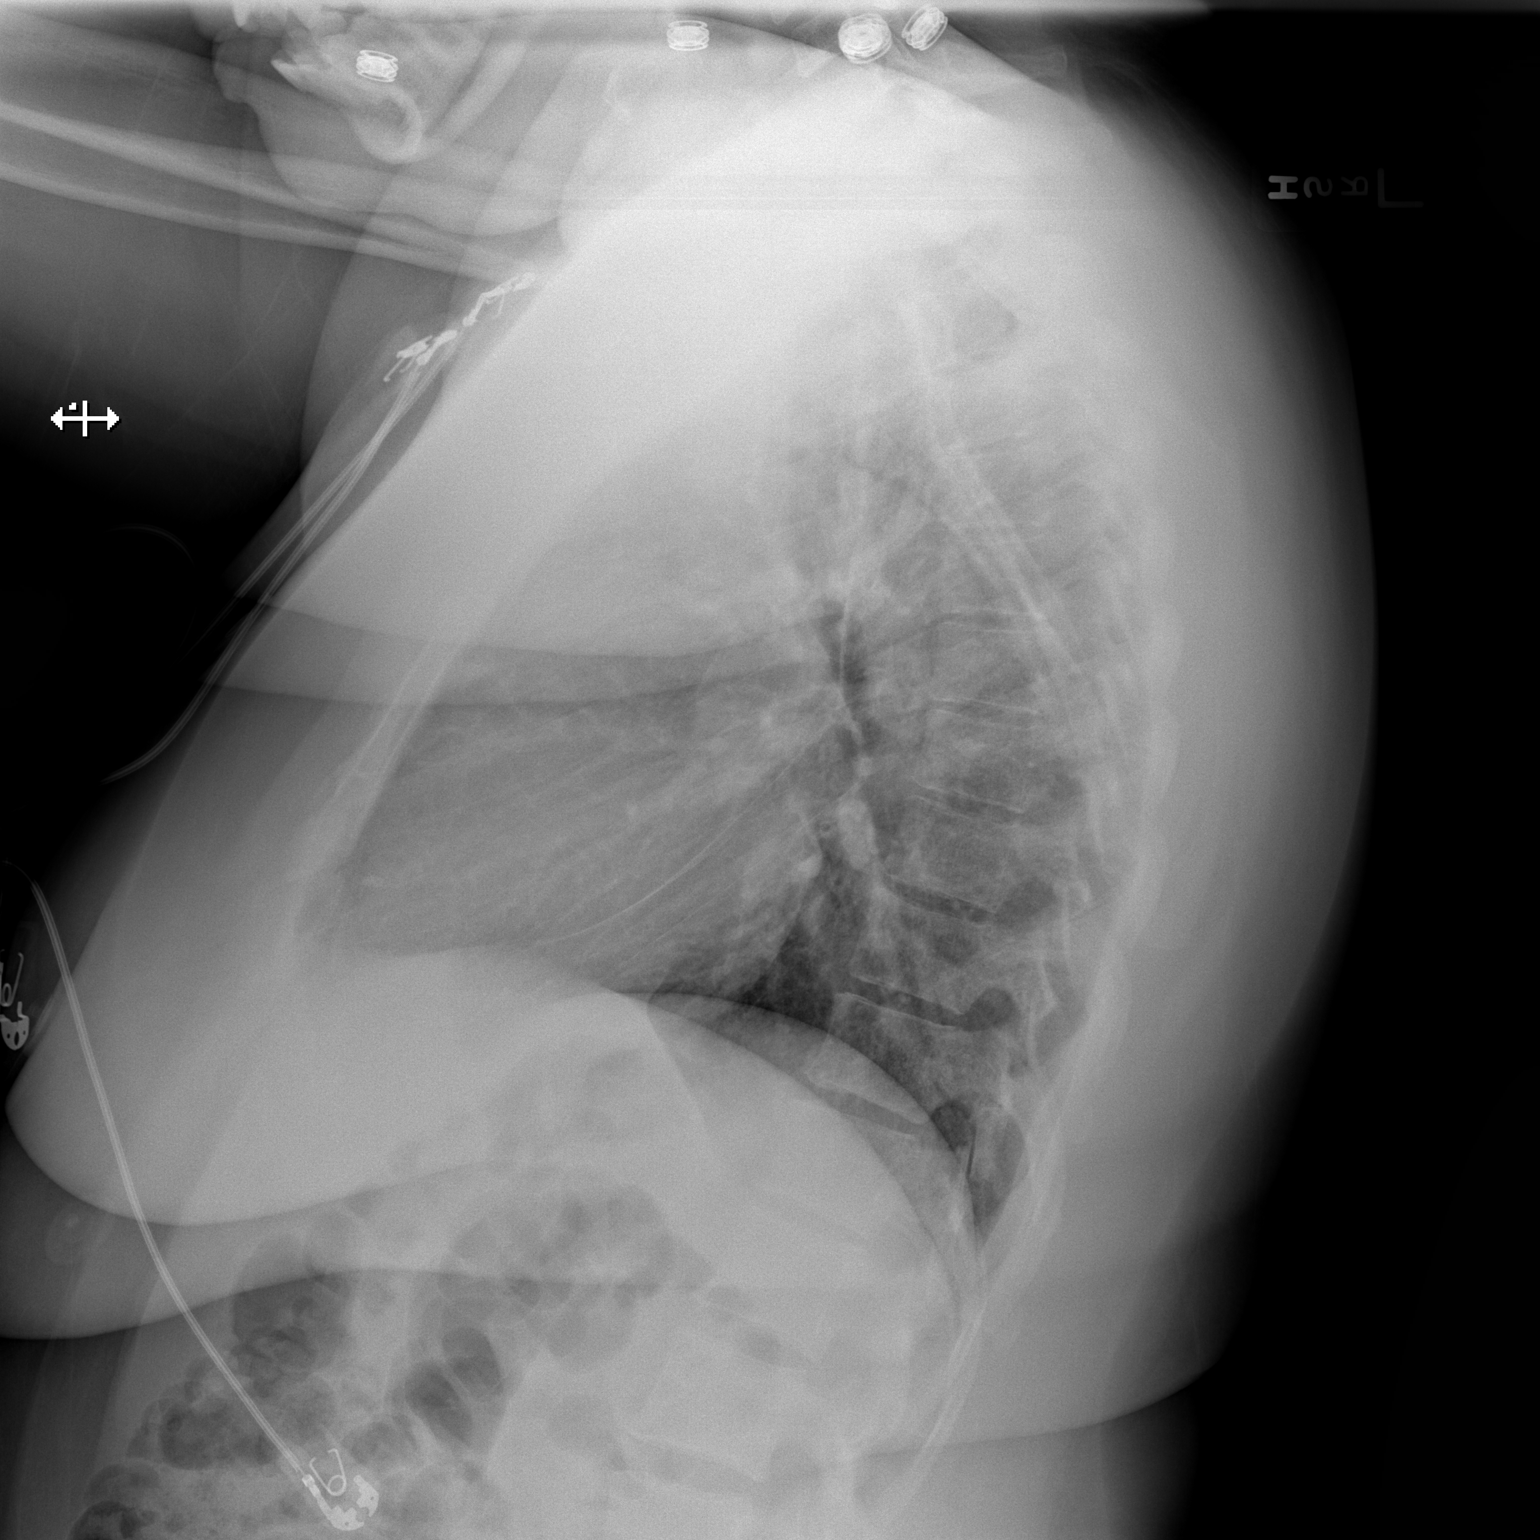

[2 of 2 positions shown; findings below may reference images not displayed]

FINDINGS: The lungs are well-expanded. There is no focal infiltrate. There is
no pleural effusion. The heart and pulmonary vascularity are normal.
The mediastinum is normal in width. The bony thorax exhibits no
acute abnormality.
IMPRESSION: There is no active cardiopulmonary disease.

## 2019-10-10 DIAGNOSIS — F418 Other specified anxiety disorders: Secondary | ICD-10-CM | POA: Diagnosis not present

## 2019-12-11 DIAGNOSIS — F418 Other specified anxiety disorders: Secondary | ICD-10-CM | POA: Diagnosis not present

## 2019-12-25 DIAGNOSIS — T3695XA Adverse effect of unspecified systemic antibiotic, initial encounter: Secondary | ICD-10-CM | POA: Diagnosis not present

## 2019-12-25 DIAGNOSIS — R300 Dysuria: Secondary | ICD-10-CM | POA: Diagnosis not present

## 2019-12-25 DIAGNOSIS — Z79899 Other long term (current) drug therapy: Secondary | ICD-10-CM | POA: Diagnosis not present

## 2019-12-25 DIAGNOSIS — R635 Abnormal weight gain: Secondary | ICD-10-CM | POA: Diagnosis not present

## 2019-12-25 DIAGNOSIS — B379 Candidiasis, unspecified: Secondary | ICD-10-CM | POA: Diagnosis not present

## 2020-01-03 DIAGNOSIS — F418 Other specified anxiety disorders: Secondary | ICD-10-CM | POA: Diagnosis not present

## 2020-01-03 DIAGNOSIS — N390 Urinary tract infection, site not specified: Secondary | ICD-10-CM | POA: Diagnosis not present

## 2020-01-03 DIAGNOSIS — Z9189 Other specified personal risk factors, not elsewhere classified: Secondary | ICD-10-CM | POA: Diagnosis not present

## 2020-03-06 ENCOUNTER — Other Ambulatory Visit (INDEPENDENT_AMBULATORY_CARE_PROVIDER_SITE_OTHER): Payer: Self-pay | Admitting: Bariatrics

## 2020-03-06 DIAGNOSIS — E8881 Metabolic syndrome: Secondary | ICD-10-CM

## 2020-03-14 DIAGNOSIS — M79629 Pain in unspecified upper arm: Secondary | ICD-10-CM | POA: Diagnosis not present

## 2020-03-18 ENCOUNTER — Other Ambulatory Visit: Payer: Self-pay

## 2020-03-18 ENCOUNTER — Telehealth (INDEPENDENT_AMBULATORY_CARE_PROVIDER_SITE_OTHER): Payer: BC Managed Care – PPO | Admitting: Family Medicine

## 2020-03-18 VITALS — Temp 97.9°F | Ht 68.0 in | Wt 205.0 lb

## 2020-03-18 DIAGNOSIS — R591 Generalized enlarged lymph nodes: Secondary | ICD-10-CM

## 2020-03-18 NOTE — Progress Notes (Signed)
Virtual Visit via Video Note  I connected with Raven Walters on 04/04/20 at  5:00 PM EDT by a video enabled telemedicine application and verified that I am speaking with the correct person using two identifiers. Chief Complaint  Patient presents with  . swollen lymphnodes    Pt stated that she got the COVID vacc on 03/11/2020 and she has some swollen lymphnodes under her Lt arm. She stated that they are going dw but are still tender to the touch.   Gave patient reassurance.  No charge for this call.

## 2020-03-18 NOTE — Patient Instructions (Signed)
° ° ° °  If you have lab work done today you will be contacted with your lab results within the next 2 weeks.  If you have not heard from us then please contact us. The fastest way to get your results is to register for My Chart. ° ° °IF you received an x-ray today, you will receive an invoice from [facility]. Please contact [facility] at [phone] with questions or concerns regarding your invoice.  ° °IF you received labwork today, you will receive an invoice from [facility]. Please contact [facility] at [phone] with questions or concerns regarding your invoice.  ° °Our billing staff will not be able to assist you with questions regarding bills from these companies. ° °You will be contacted with the lab results as soon as they are available. The fastest way to get your results is to activate your My Chart account. Instructions are located on the last page of this paperwork. If you have not heard from us regarding the results in 2 weeks, please contact this office. °  ° ° ° °

## 2020-04-01 DIAGNOSIS — E8881 Metabolic syndrome: Secondary | ICD-10-CM | POA: Diagnosis not present

## 2020-04-01 DIAGNOSIS — N915 Oligomenorrhea, unspecified: Secondary | ICD-10-CM | POA: Diagnosis not present

## 2020-04-01 DIAGNOSIS — Z79899 Other long term (current) drug therapy: Secondary | ICD-10-CM | POA: Diagnosis not present

## 2020-04-01 DIAGNOSIS — I10 Essential (primary) hypertension: Secondary | ICD-10-CM | POA: Diagnosis not present

## 2020-04-01 DIAGNOSIS — R635 Abnormal weight gain: Secondary | ICD-10-CM | POA: Diagnosis not present

## 2020-04-20 DIAGNOSIS — N915 Oligomenorrhea, unspecified: Secondary | ICD-10-CM | POA: Diagnosis not present

## 2020-04-20 DIAGNOSIS — E8881 Metabolic syndrome: Secondary | ICD-10-CM | POA: Diagnosis not present

## 2020-04-20 DIAGNOSIS — I10 Essential (primary) hypertension: Secondary | ICD-10-CM | POA: Diagnosis not present

## 2020-04-20 DIAGNOSIS — R635 Abnormal weight gain: Secondary | ICD-10-CM | POA: Diagnosis not present

## 2020-04-20 DIAGNOSIS — Z13220 Encounter for screening for lipoid disorders: Secondary | ICD-10-CM | POA: Diagnosis not present

## 2020-04-20 DIAGNOSIS — Z131 Encounter for screening for diabetes mellitus: Secondary | ICD-10-CM | POA: Diagnosis not present

## 2020-05-30 DIAGNOSIS — N926 Irregular menstruation, unspecified: Secondary | ICD-10-CM | POA: Diagnosis not present

## 2020-05-30 DIAGNOSIS — N941 Unspecified dyspareunia: Secondary | ICD-10-CM | POA: Diagnosis not present

## 2020-05-30 DIAGNOSIS — Z30431 Encounter for routine checking of intrauterine contraceptive device: Secondary | ICD-10-CM | POA: Diagnosis not present

## 2020-06-03 DIAGNOSIS — E282 Polycystic ovarian syndrome: Secondary | ICD-10-CM | POA: Diagnosis not present

## 2020-06-03 DIAGNOSIS — R635 Abnormal weight gain: Secondary | ICD-10-CM | POA: Diagnosis not present

## 2020-06-03 DIAGNOSIS — N915 Oligomenorrhea, unspecified: Secondary | ICD-10-CM | POA: Diagnosis not present

## 2020-06-03 DIAGNOSIS — E8881 Metabolic syndrome: Secondary | ICD-10-CM | POA: Diagnosis not present

## 2021-09-17 ENCOUNTER — Emergency Department (HOSPITAL_BASED_OUTPATIENT_CLINIC_OR_DEPARTMENT_OTHER)
Admission: EM | Admit: 2021-09-17 | Discharge: 2021-09-18 | Disposition: A | Discharge status: Home / Self Care | Payer: BC Managed Care – PPO | Attending: Emergency Medicine | Admitting: Emergency Medicine

## 2021-09-17 ENCOUNTER — Other Ambulatory Visit: Payer: Self-pay

## 2021-09-17 ENCOUNTER — Emergency Department (HOSPITAL_BASED_OUTPATIENT_CLINIC_OR_DEPARTMENT_OTHER): Payer: BC Managed Care – PPO | Admitting: Radiology

## 2021-09-17 ENCOUNTER — Encounter (HOSPITAL_BASED_OUTPATIENT_CLINIC_OR_DEPARTMENT_OTHER): Payer: Self-pay | Admitting: *Deleted

## 2021-09-17 DIAGNOSIS — J029 Acute pharyngitis, unspecified: Secondary | ICD-10-CM

## 2021-09-17 DIAGNOSIS — J111 Influenza due to unidentified influenza virus with other respiratory manifestations: Secondary | ICD-10-CM | POA: Diagnosis not present

## 2021-09-17 DIAGNOSIS — R059 Cough, unspecified: Secondary | ICD-10-CM | POA: Diagnosis present

## 2021-09-17 DIAGNOSIS — Z79899 Other long term (current) drug therapy: Secondary | ICD-10-CM | POA: Diagnosis not present

## 2021-09-17 DIAGNOSIS — Z20822 Contact with and (suspected) exposure to covid-19: Secondary | ICD-10-CM | POA: Diagnosis not present

## 2021-09-17 DIAGNOSIS — I10 Essential (primary) hypertension: Secondary | ICD-10-CM | POA: Diagnosis not present

## 2021-09-17 DIAGNOSIS — J45909 Unspecified asthma, uncomplicated: Secondary | ICD-10-CM | POA: Insufficient documentation

## 2021-09-17 LAB — RESP PANEL BY RT-PCR (FLU A&B, COVID) ARPGX2
Influenza A by PCR: NEGATIVE
Influenza B by PCR: NEGATIVE
SARS Coronavirus 2 by RT PCR: NEGATIVE

## 2021-09-17 MED ORDER — ACETAMINOPHEN 325 MG PO TABS
650.0000 mg | ORAL_TABLET | Freq: Once | ORAL | Status: AC | PRN
  Administered 2021-09-17: 650 mg via ORAL
  Filled 2021-09-17: qty 2

## 2021-09-17 NOTE — ED Triage Notes (Signed)
Pt with c/o cough today and feels sob. Does have hx of asthma. Pain in throat from coughing. Breathing treatment just PTA. Lung sounds are clear. Low grade temps at home. Pt son is also sick. Home covid test negative.

## 2021-09-18 LAB — GROUP A STREP BY PCR: Group A Strep by PCR: NOT DETECTED

## 2021-09-18 MED ORDER — PREDNISONE 50 MG PO TABS
60.0000 mg | ORAL_TABLET | Freq: Once | ORAL | Status: AC
  Administered 2021-09-18: 60 mg via ORAL
  Filled 2021-09-18: qty 1

## 2021-09-18 MED ORDER — PREDNISONE 50 MG PO TABS: 50.0000 mg | ORAL_TABLET | Freq: Every day | ORAL | 0 refills | Status: AC

## 2021-09-18 MED ORDER — ALBUTEROL SULFATE (2.5 MG/3ML) 0.083% IN NEBU: 2.5000 mg | INHALATION_SOLUTION | Freq: Four times a day (QID) | RESPIRATORY_TRACT | 0 refills | Status: AC | PRN

## 2021-09-18 NOTE — ED Provider Notes (Signed)
[facility] EMERGENCY DEPT Provider Note   CSN: [id number] Arrival date & time: 09/17/21  1920     History Chief Complaint  Patient presents with   Cough    Raven Walters is a 35 y.o. female.  The history is provided by the patient.  Cough She has history of hypertension, asthma and comes in complaining of sore throat and fever which started yesterday.  She did not check her temperature at home.  She denies chills or sweats.  She has developed a cough which is productive of some yellow sputum.  There has been some dyspnea.  She took a dose of prednisone and used her son's albuterol nebulizer which did give her some temporary relief.  Her son has a respiratory infection and was taken to the pediatrician where swab is negative for influenza, COVID-19, RSV not.  Patient denies nausea, vomiting, arthralgias, myalgias.   Past Medical History:  Diagnosis Date   Anxiety    Asthma    pt states it is induced by excercise   Chest pain 02/07/2017   Dyspnea    Essential hypertension 02/07/2017   Headache    Hx MRSA infection    Infection    UTI   Palpitations 02/07/2017   Preeclampsia    PVC (premature ventricular contraction) 03/18/2017    Patient Active Problem List   Diagnosis Date Noted   Depression 01/17/2019   Class 1 obesity with serious comorbidity and body mass index (BMI) of 30.0 to 30.9 in adult 12/22/2018   Vitamin D deficiency 12/21/2017   Insulin resistance 12/21/2017   PVC (premature ventricular contraction) 03/18/2017   Essential hypertension 02/07/2017    Past Surgical History:  Procedure Laterality Date   NO PAST SURGERIES       OB History     Gravida  4   Para  2   Term  2   Preterm      AB  1   Living  2      SAB  1   IAB      Ectopic      Multiple  0   Live Births  2           Family History  Problem Relation Age of Onset   Hypertension Mother    Cardiomyopathy Mother    Heart disease Mother    Anxiety disorder  Mother    Obesity Mother    Hypertension Father    Heart disease Father    Hyperlipidemia Father    Miscarriages / [place] Son    Hypertension Brother    Diabetes Maternal Grandmother    Heart disease Maternal Grandfather    Hypertension Maternal Grandfather    Stroke Maternal Grandfather    Heart disease Paternal Grandmother    Hypertension Paternal Grandmother    Stroke Paternal Grandmother    Alzheimer's disease Paternal Grandmother    Hearing loss Neg Hx     Social History   Tobacco Use   Smoking status: Never   Smokeless tobacco: Never  Vaping Use   Vaping Use: Never used  Substance Use Topics   Alcohol use: Yes    Comment: social   Drug use: No    Home Medications Prior to Admission medications   Medication Sig Start Date End Date Taking? Authorizing Provider  albuterol (PROVENTIL) (2.5 MG/3ML) 0.083% nebulizer solution Take 3 mLs (2.5 mg total) by nebulization every 6 (six) hours as needed for wheezing or shortness of breath. 09/18/21  Yes  Dione Booze, MD  predniSONE (DELTASONE) 50 MG tablet Take 1 tablet (50 mg total) by mouth daily. 09/18/21  Yes Dione Booze, MD  fluticasone [facility]) 50 MCG/ACT nasal spray Place 2 sprays into both nostrils daily. 06/22/18   Doristine Bosworth, MD  labetalol (NORMODYNE) 100 MG tablet Take 1 tablet (100 mg total) by mouth 2 (two) times daily. 10/02/19   Doristine Bosworth, MD  levonorgestrel (MIRENA, 52 MG,) 20 MCG/24HR IUD 1 Device by Intrauterine route once. [phone]    [provider]  metFORMIN (GLUCOPHAGE) 500 MG tablet Take 1 tablet (500 mg total) by mouth 2 (two) times daily with a meal. 04/27/19   Corinna Capra A, DO  ondansetron (ZOFRAN) 4 MG tablet Take 1 tablet (4 mg total) by mouth every 8 (eight) hours as needed for nausea or vomiting. 03/13/19   Georgina Quint, MD  phentermine 37.5 MG capsule Take 37.5 mg by mouth every morning.    [provider]  topiramate (TOPAMAX) 50 MG tablet Take 1 tablet (50  mg total) by mouth daily. 02/06/19   [place]mire, Thermon Leyland, FNP  Vitamin D, Ergocalciferol, (DRISDOL) 1.25 MG (50000 UT) CAPS capsule Take 1 capsule (50,000 Units total) by mouth every 3 (three) days. 04/27/19   Roswell Nickel, DO    Allergies    Patient has no known allergies.  Review of Systems   Review of Systems  Respiratory:  Positive for cough.   All other systems reviewed and are negative.  Physical Exam Updated Vital Signs BP (!) 144/87   Pulse 99   Temp (!) 100.5 F (38.1 C) (Oral)   Resp 18   SpO2 100%   Physical Exam Vitals and nursing note reviewed.  35 year old female, resting comfortably and in no acute distress. Vital signs are significant for elevated temperature and borderline elevated blood pressure. Oxygen saturation is 100%, which is normal. Head is normocephalic and atraumatic. PERRLA, EOMI. Oropharynx is clear.  There is no pooling of secretions.  Phonation is normal. Neck is nontender and supple without adenopathy or JVD. Back is nontender and there is no CVA tenderness. Lungs are clear without rales, wheezes, or rhonchi.  A slightly prolonged exhalation phase is noted. Chest is nontender. Heart has regular rate and rhythm without murmur. Abdomen is soft, flat, nontender. Extremities have no cyanosis or edema, full range of motion is present. Skin is warm and dry without rash. Neurologic: Mental status is normal, cranial nerves are intact, moves all extremities equally.  ED Results / Procedures / Treatments   Labs (all labs ordered are listed, but only abnormal results are displayed) Labs Reviewed  RESP PANEL BY RT-PCR (FLU A&B, COVID) [id number]  GROUP A STREP BY PCR   Radiology DG Chest 2 View  Result Date: 09/17/2021 CLINICAL DATA:  Cough and asthma EXAM: CHEST - 2 VIEW COMPARISON:  Chest x-ray 08/11/2018 FINDINGS: The heart and mediastinal contours are within normal limits. No focal consolidation. No pulmonary edema. No pleural effusion. No pneumothorax.  No acute osseous abnormality. IMPRESSION: No active cardiopulmonary disease. Electronically Signed   By: Tish Frederickson M.D.   On: 09/17/2021 20:04    Procedures Procedures   Medications Ordered in ED Medications  predniSONE (DELTASONE) tablet 60 mg (has no administration in time range)  acetaminophen (TYLENOL) tablet 650 mg (650 mg Oral Given 09/17/21 1943)    ED Course  I have reviewed the triage vital signs and the nursing notes.  Pertinent labs & imaging results that  were available during my care of the patient were reviewed by me and considered in my medical decision making (see chart for details).   MDM Rules/Calculators/A&P                         Sore throat, fever, cough, probable viral respiratory infection, possible streptococcal infection.  Old records are reviewed, and she has no relevant past visits.  Chest x-ray ordered at triage shows no evidence of pneumonia.  Respiratory pathogen panel was negative for influenza and COVID-19.  We will check strep PCR.  She is given a dose of prednisone.  Strep PCR is negative.  She is discharged with prescription for 5-day course of prednisone and albuterol nebulizer solution which she can use in her son's nebulizer machine.  Return precautions discussed.  Final Clinical Impression(s) / ED Diagnoses Final diagnoses:  Influenza-like illness  Sore throat    Rx / DC Orders ED Discharge Orders          Ordered    predniSONE (DELTASONE) 50 MG tablet  Daily        09/18/21 0003    albuterol (PROVENTIL) (2.5 MG/3ML) 0.083% nebulizer solution  Every 6 hours PRN        09/18/21 0003             Dione Booze, MD 09/18/21 [phone]

## 2021-09-18 NOTE — Discharge Instructions (Signed)
Take acetaminophen and/or ibuprofen as needed for fever.  Use albuterol as needed to help with your shortness of breath and coughing.  Return if your symptoms are getting worse.

## 2021-09-18 NOTE — ED Notes (Signed)
Patient discharged home to self.  Discharge instructions reviewed.  Patient verbalizes understanding and demonstrates knowledge via teachback method.  VS WDL.  Ambulatory out of ED.

## 2022-02-03 ENCOUNTER — Other Ambulatory Visit: Payer: Self-pay | Admitting: Obstetrics and Gynecology

## 2022-02-03 DIAGNOSIS — N644 Mastodynia: Secondary | ICD-10-CM

## 2022-04-16 ENCOUNTER — Other Ambulatory Visit: Payer: BC Managed Care – PPO

## 2022-06-17 ENCOUNTER — Encounter (INDEPENDENT_AMBULATORY_CARE_PROVIDER_SITE_OTHER): Payer: Self-pay

## 2022-11-13 IMAGING — DX DG CHEST 2V
2 series · 2 of 2 positions shown · non-contrast
Comparison: Chest x-ray 08/11/2018

CLINICAL DATA: Cough and asthma

EXAM:
CHEST - 2 VIEW

[chest pa]
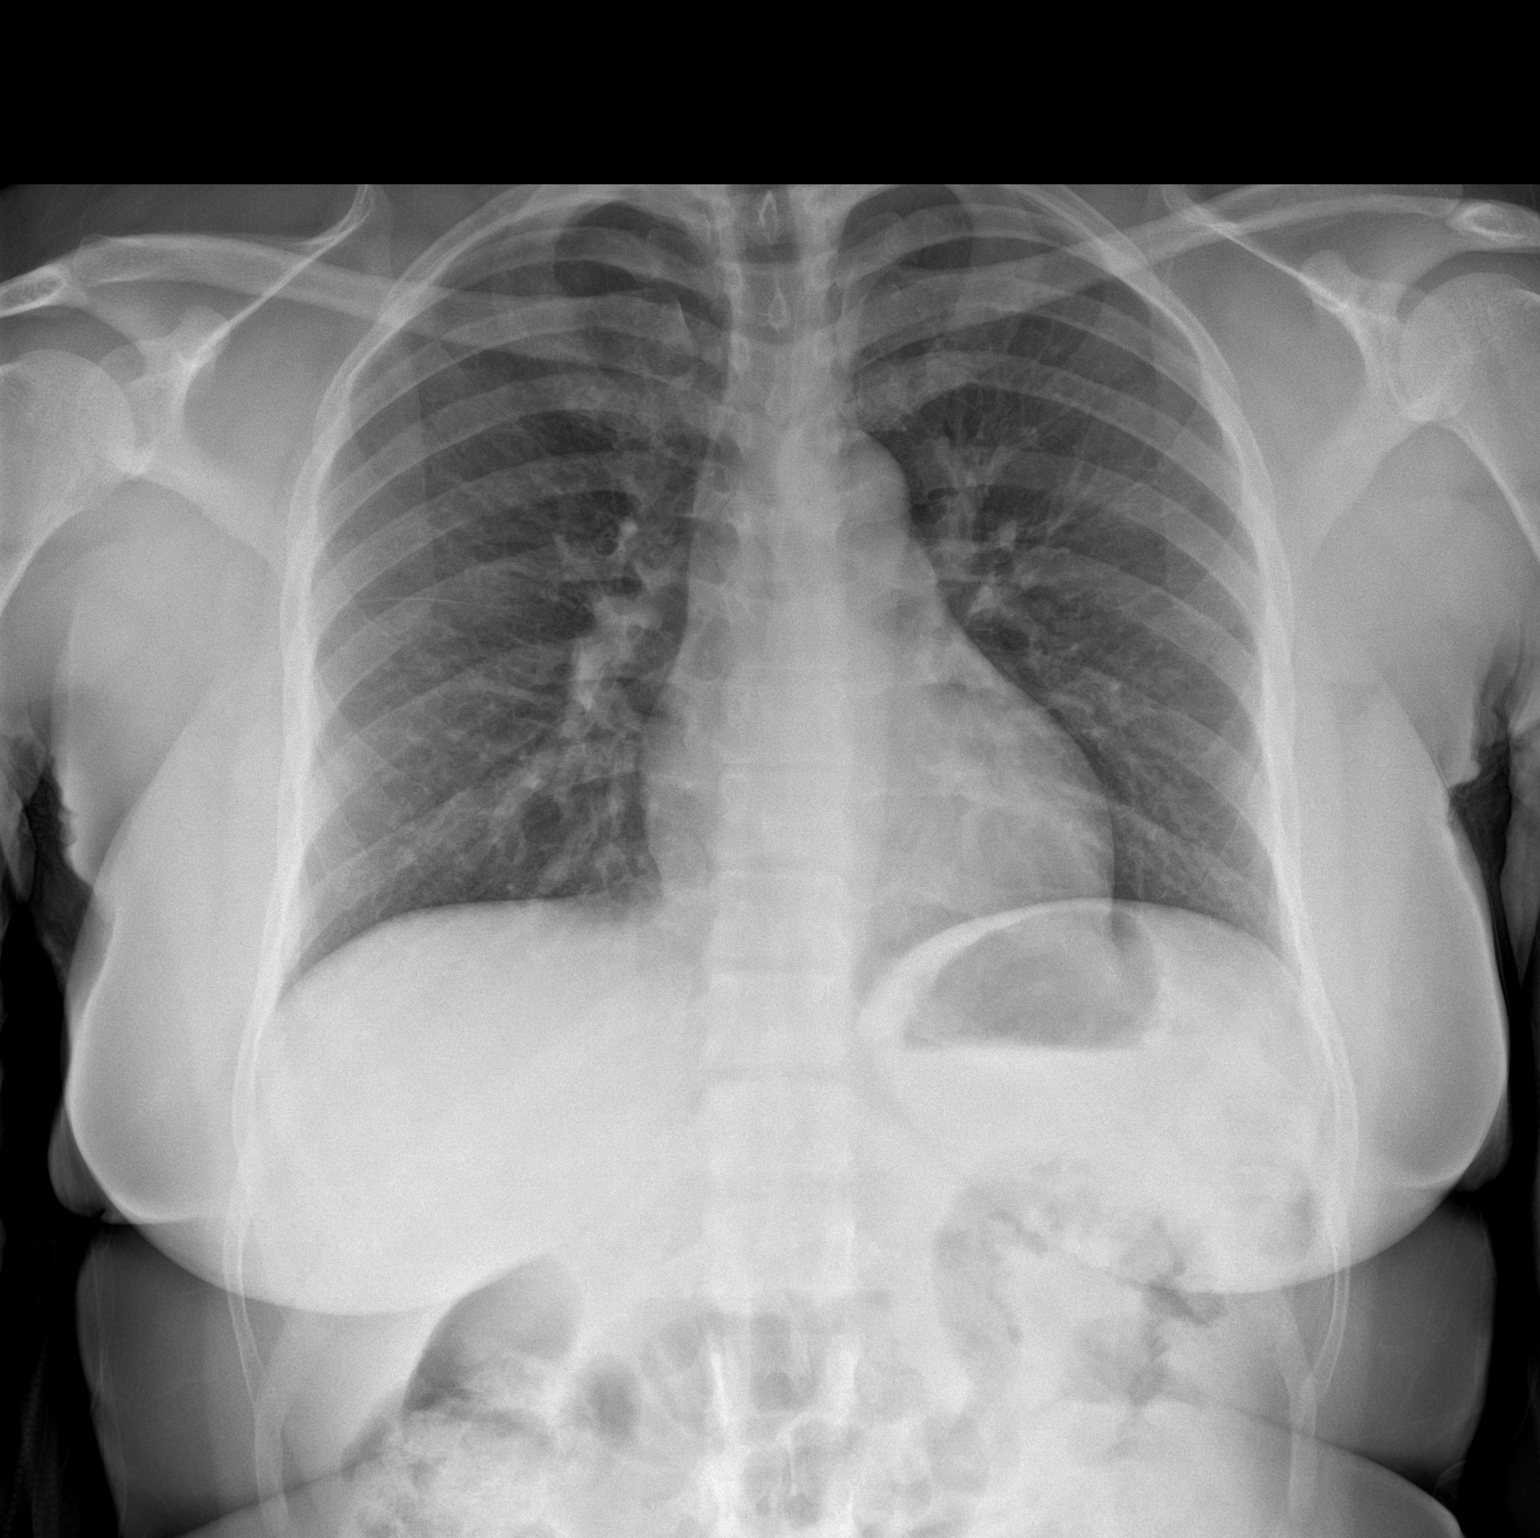

[chest lat]
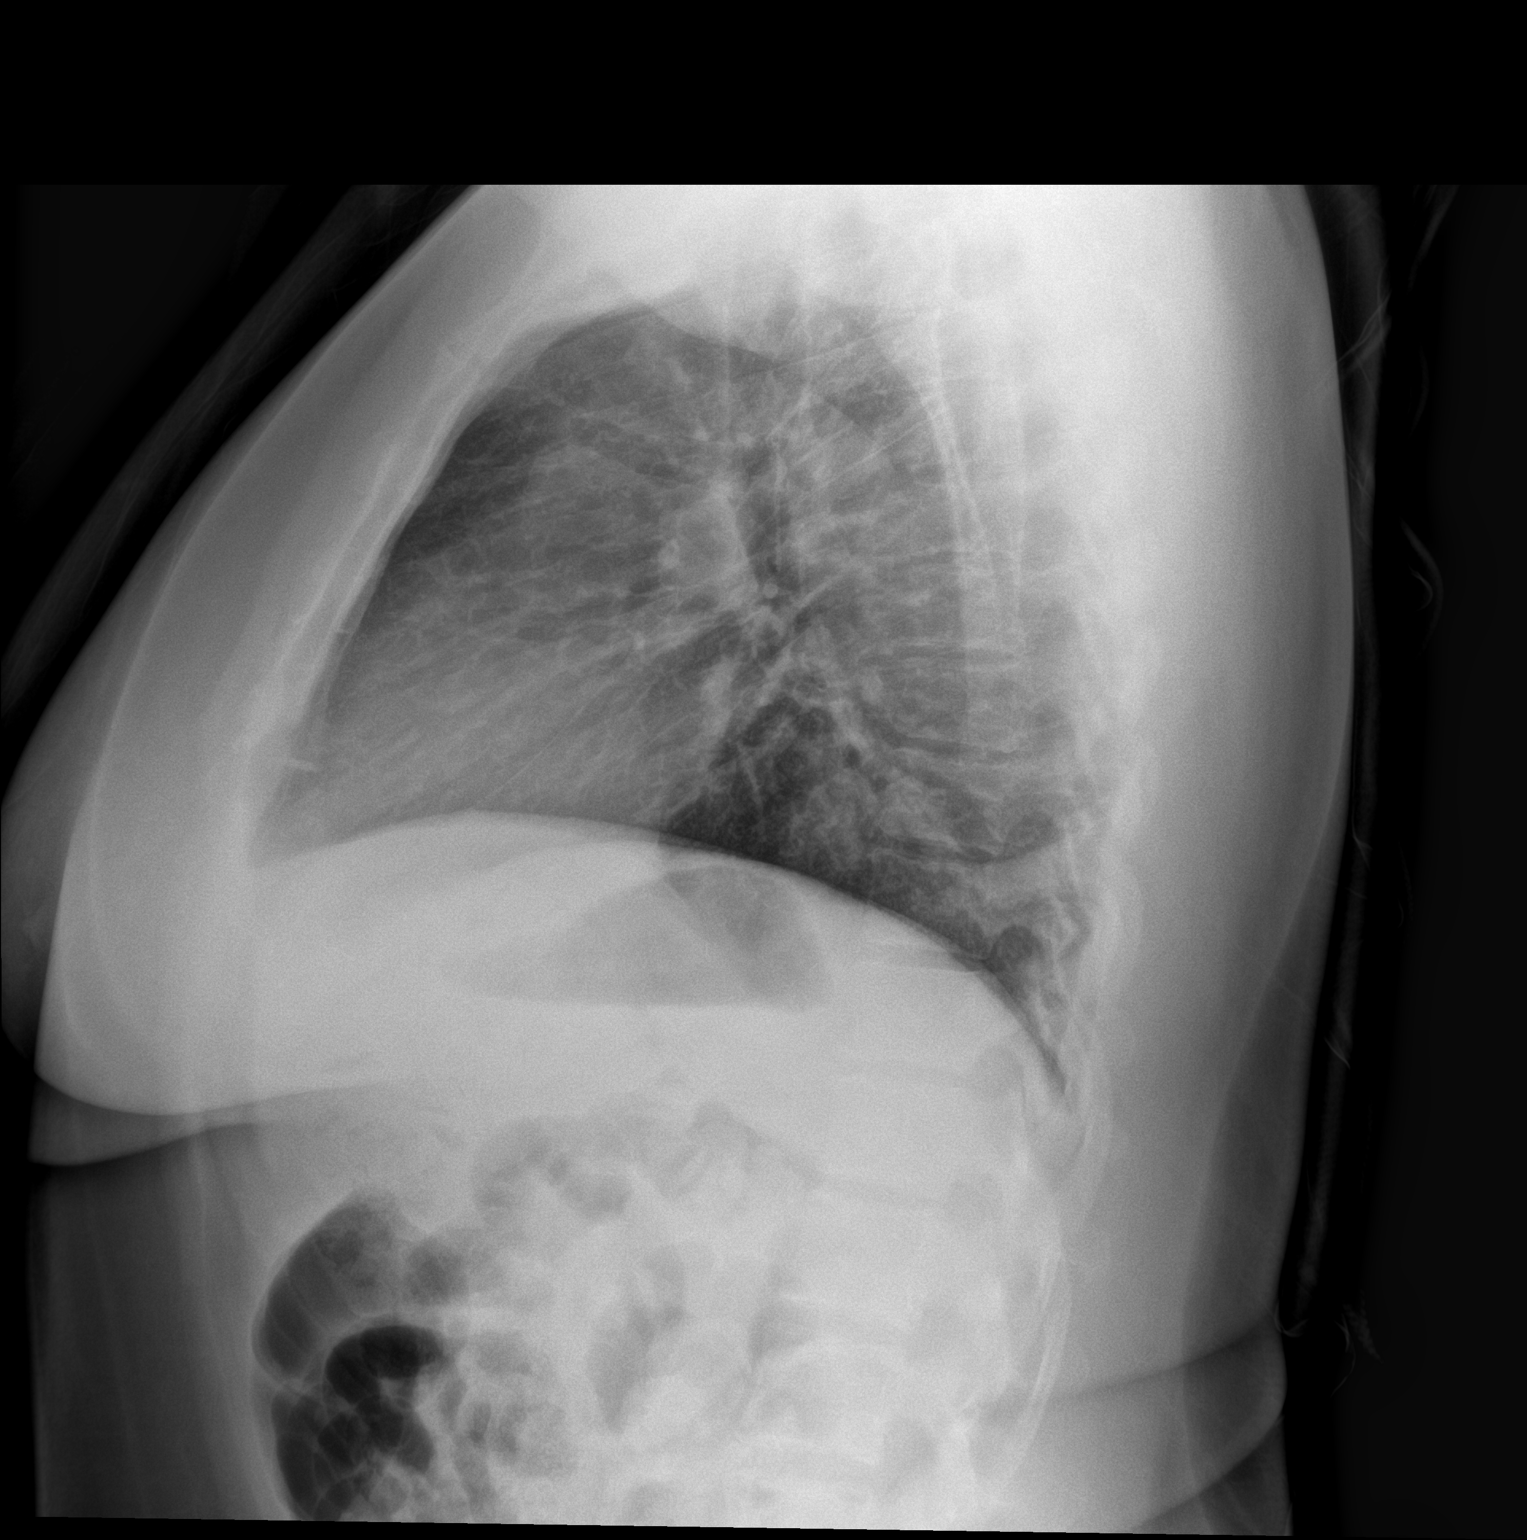

[2 of 2 positions shown; findings below may reference images not displayed]

FINDINGS: The heart and mediastinal contours are within normal limits.

No focal consolidation. No pulmonary edema. No pleural effusion. No
pneumothorax.

No acute osseous abnormality.
IMPRESSION: No active cardiopulmonary disease.
